# Patient Record
Sex: Female | Born: 1985 | Race: White | Hispanic: No | Marital: Married | Smoking: Former smoker
Health system: Southern US, Community
[De-identification: ages and names within clinical notes are randomized; demographics above are authoritative.]

## PROBLEM LIST (undated history)

## (undated) DIAGNOSIS — M419 Scoliosis, unspecified: Secondary | ICD-10-CM

## (undated) DIAGNOSIS — K219 Gastro-esophageal reflux disease without esophagitis: Secondary | ICD-10-CM

## (undated) DIAGNOSIS — J40 Bronchitis, not specified as acute or chronic: Secondary | ICD-10-CM

## (undated) DIAGNOSIS — F32A Depression, unspecified: Secondary | ICD-10-CM

## (undated) DIAGNOSIS — K2970 Gastritis, unspecified, without bleeding: Secondary | ICD-10-CM

## (undated) DIAGNOSIS — F419 Anxiety disorder, unspecified: Secondary | ICD-10-CM

## (undated) HISTORY — PX: UPPER GI ENDOSCOPY: SHX6162

---

## 2018-10-05 ENCOUNTER — Emergency Department (HOSPITAL_COMMUNITY)
Admission: EM | Admit: 2018-10-05 | Discharge: 2018-10-05 | Disposition: A | Discharge status: Home / Self Care | Payer: Self-pay | Attending: Emergency Medicine | Admitting: Emergency Medicine

## 2018-10-05 ENCOUNTER — Other Ambulatory Visit: Payer: Self-pay

## 2018-10-05 ENCOUNTER — Encounter (HOSPITAL_COMMUNITY): Payer: Self-pay

## 2018-10-05 ENCOUNTER — Emergency Department (HOSPITAL_COMMUNITY): Payer: Self-pay

## 2018-10-05 DIAGNOSIS — Z87891 Personal history of nicotine dependence: Secondary | ICD-10-CM | POA: Insufficient documentation

## 2018-10-05 DIAGNOSIS — J209 Acute bronchitis, unspecified: Secondary | ICD-10-CM | POA: Insufficient documentation

## 2018-10-05 DIAGNOSIS — J40 Bronchitis, not specified as acute or chronic: Secondary | ICD-10-CM

## 2018-10-05 DIAGNOSIS — J069 Acute upper respiratory infection, unspecified: Secondary | ICD-10-CM | POA: Insufficient documentation

## 2018-10-05 HISTORY — DX: Gastritis, unspecified, without bleeding: K29.70

## 2018-10-05 HISTORY — DX: Bronchitis, not specified as acute or chronic: J40

## 2018-10-05 MED ORDER — PREDNISONE 20 MG PO TABS
40.0000 mg | ORAL_TABLET | Freq: Once | ORAL | Status: AC
  Administered 2018-10-05: 40 mg via ORAL
  Filled 2018-10-05: qty 2

## 2018-10-05 MED ORDER — PREDNISONE 20 MG PO TABS: 40.0000 mg | ORAL_TABLET | Freq: Every day | ORAL | 0 refills | Status: DC

## 2018-10-05 MED ORDER — PSEUDOEPHEDRINE HCL 60 MG PO TABS
60.0000 mg | ORAL_TABLET | Freq: Once | ORAL | Status: AC
  Administered 2018-10-05: 60 mg via ORAL
  Filled 2018-10-05: qty 1

## 2018-10-05 MED ORDER — PSEUDOEPHEDRINE HCL ER 120 MG PO TB12: 120.0000 mg | ORAL_TABLET | Freq: Two times a day (BID) | ORAL | 1 refills | Status: DC

## 2018-10-05 NOTE — ED Provider Notes (Signed)
[facility] EMERGENCY DEPARTMENT Provider Note   CSN: [id number] Arrival date & time: 10/05/18  1844     History   Chief Complaint Chief Complaint  Patient presents with  . Cough    congestion, body aches    HPI Lindsay Mccoy is a 32 y.o. female.  Patient is a 32 year old female who presents to the emergency department with a complaint of cough and congestion and body aches.  The patient states she has been sick off and on for nearly a month.  She has been having problems with chest and nasal congestion.  She has had sensations of feeling short of breath at times.  She has had problems with body aches.  There is been no temperature elevation it has been measured.  There is been no excessive vomiting or diarrhea reported.  No blood in the phlegm.  Patient states that she has had both greenish and brownish looking phlegm at times.  Patient has not been out of the country recently.  She is a former smoker, and has been treated for bronchitis in the past.  The history is provided by the patient.    Past Medical History:  Diagnosis Date  . Bronchitis   . Gastritis     There are no active problems to display for this patient.   Past Surgical History:  Procedure Laterality Date  . UPPER GI ENDOSCOPY       OB History   None      Home Medications    Prior to Admission medications   Not on File    Family History No family history on file.  Social History Social History   Tobacco Use  . Smoking status: Former Smoker    Packs/day: 0.50    Years: 20.00    Pack years: 10.00    Types: Cigarettes    Last attempt to quit: 10/06/2015    Years since quitting: 3.0  . Smokeless tobacco: Former Engineer, waterUser  Substance Use Topics  . Alcohol use: Yes    Comment: occ  . Drug use: Never     Allergies   Patient has no known allergies.   Review of Systems Review of Systems  Constitutional: Negative for activity change and fever.       All ROS Neg except as noted in HPI    HENT: Positive for congestion and postnasal drip. Negative for nosebleeds.   Eyes: Negative for photophobia and discharge.  Respiratory: Positive for cough and shortness of breath. Negative for wheezing.   Cardiovascular: Negative for chest pain and palpitations.  Gastrointestinal: Negative for abdominal pain and blood in stool.  Genitourinary: Negative for dysuria, frequency and hematuria.  Musculoskeletal: Positive for myalgias. Negative for arthralgias, back pain and neck pain.  Skin: Negative.   Neurological: Negative for dizziness, seizures and speech difficulty.  Psychiatric/Behavioral: Negative for confusion and hallucinations.     Physical Exam Updated Vital Signs BP 117/64 (BP Location: Right Arm)   Pulse 79   Temp 98.7 F (37.1 C) (Oral)   Resp 17   Ht 5\' 5"  (1.651 m)   Wt 54.4 kg   LMP 09/21/2018 (Approximate)   SpO2 100%   BMI 19.97 kg/m   Physical Exam  Constitutional: She is oriented to person, place, and time. She appears well-developed and well-nourished.  Non-toxic appearance.  HENT:  Head: Normocephalic.  Right Ear: Tympanic membrane and external ear normal.  Left Ear: Tympanic membrane and external ear normal.  Nasal congestion present.  Eyes: Pupils are  equal, round, and reactive to light. EOM and lids are normal.  Neck: Normal range of motion. Neck supple. Carotid bruit is not present.  Cardiovascular: Normal rate, regular rhythm, normal heart sounds, intact distal pulses and normal pulses.  Pulmonary/Chest: No respiratory distress. She has rhonchi.  Abdominal: Soft. Bowel sounds are normal. There is no tenderness. There is no guarding.  Musculoskeletal: Normal range of motion.  Lymphadenopathy:       Head (right side): No submandibular adenopathy present.       Head (left side): No submandibular adenopathy present.    She has no cervical adenopathy.  Neurological: She is alert and oriented to person, place, and time. She has normal strength. No  cranial nerve deficit or sensory deficit. Coordination normal.  Skin: Skin is warm and dry. No rash noted.  Psychiatric: She has a normal mood and affect. Her speech is normal.  Nursing note and vitals reviewed.    ED Treatments / Results  Labs (all labs ordered are listed, but only abnormal results are displayed) Labs Reviewed - No data to display  EKG None  Radiology Dg Chest 2 View  Result Date: 10/05/2018 CLINICAL DATA:  Cough EXAM: CHEST - 2 VIEW COMPARISON:  None. FINDINGS: The heart size and mediastinal contours are within normal limits. Both lungs are clear. Severe dextroscoliosis of the thoracic spine. IMPRESSION: No active cardiopulmonary disease. Electronically Signed   By: Elige Ko   On: 10/05/2018 20:01    Procedures Procedures (including critical care time)  Medications Ordered in ED Medications - No data to display   Initial Impression / Assessment and Plan / ED Course  I have reviewed the triage vital signs and the nursing notes.  Pertinent labs & imaging results that were available during my care of the patient were reviewed by me and considered in my medical decision making (see chart for details).       Final Clinical Impressions(s) / ED Diagnoses MDM  Vital signs are within normal limits.  Pulse oximetry is 100% on room air.  Within normal limits by my interpretation.  Patient has been ill for nearly a month off and on.  Most recently she is been having increasing congestion, body aches, and occasionally a sensation of shortness of breath.  Patient has a history of being a former smoker.  Patient speaks in complete sentences without problem.  Patient is ambulatory without problem.  Chest x-ray shows some possible early bronchitic changes, but no consolidation or other emergent conditions.  Chest x-ray reviewed by me.  I discussed the findings of the examination as well as the findings of the x-ray with the patient in terms of which she  understands.  Prescription for prednisone 40 mg daily and Sudafed every 12 hours given to the patient.  Of asked her to wash hands frequently and for also provided for her mask.  Questions were answered.  Feel that it is safe for the patient to be discharged home.   Final diagnoses:  Bronchitis  Upper respiratory tract infection, unspecified type    ED Discharge Orders         Ordered    predniSONE (DELTASONE) 20 MG tablet  Daily     10/05/18 2020    pseudoephedrine (SUDAFED 12 HOUR) 120 MG 12 hr tablet  2 times daily     10/05/18 2020           Ivery Quale, PA-C 10/05/18 2028    Benjiman Core, MD 10/05/18 [phone]

## 2018-10-05 NOTE — ED Triage Notes (Signed)
Pt reports cough, congestion, body aches on and off for about a month. Pt does not have fever. Pt reports productive cough with greenish color.

## 2018-10-05 NOTE — Discharge Instructions (Signed)
Your vital signs are within normal limits.  Your oxygen level is 100% on room air.  Your chest x-ray suggest possible bronchitis changes, but no pneumonia or other acute problems.  Please increase fluids.  Please wash hands frequently.  Use Sudafed 2 times daily or every 12 hours.  Use prednisone daily with a meal.  Use Tylenol every 4 hours, or ibuprofen every 6 hours for fever, chills, or body aches.  Please use your mask until symptoms have resolved.

## 2019-01-22 ENCOUNTER — Emergency Department (HOSPITAL_COMMUNITY): Payer: Self-pay

## 2019-01-22 ENCOUNTER — Encounter (HOSPITAL_COMMUNITY): Payer: Self-pay

## 2019-01-22 ENCOUNTER — Emergency Department (HOSPITAL_COMMUNITY)
Admission: EM | Admit: 2019-01-22 | Discharge: 2019-01-22 | Disposition: A | Discharge status: Home / Self Care | Payer: Self-pay | Attending: Emergency Medicine | Admitting: Emergency Medicine

## 2019-01-22 ENCOUNTER — Other Ambulatory Visit: Payer: Self-pay

## 2019-01-22 DIAGNOSIS — Z87891 Personal history of nicotine dependence: Secondary | ICD-10-CM | POA: Insufficient documentation

## 2019-01-22 DIAGNOSIS — Z79899 Other long term (current) drug therapy: Secondary | ICD-10-CM | POA: Insufficient documentation

## 2019-01-22 DIAGNOSIS — J111 Influenza due to unidentified influenza virus with other respiratory manifestations: Secondary | ICD-10-CM | POA: Insufficient documentation

## 2019-01-22 DIAGNOSIS — R69 Illness, unspecified: Secondary | ICD-10-CM

## 2019-01-22 HISTORY — DX: Scoliosis, unspecified: M41.9

## 2019-01-22 MED ORDER — OSELTAMIVIR PHOSPHATE 75 MG PO CAPS: 75.0000 mg | ORAL_CAPSULE | Freq: Two times a day (BID) | ORAL | 0 refills | Status: AC

## 2019-01-22 NOTE — ED Provider Notes (Signed)
[facility] EMERGENCY DEPARTMENT Provider Note   CSN: [id number] Arrival date & time: 01/22/19  1423    History   Chief Complaint Chief Complaint  Patient presents with  . Generalized Body Aches    HPI Lindsay Mccoy is a 33 y.o. female.     The history is provided by the patient. No language interpreter was used.  Cough  Cough characteristics:  Productive Sputum characteristics:  Nondescript Severity:  Moderate Onset quality:  Gradual Duration:  2 days Timing:  Constant Progression:  Worsening Chronicity:  New Smoker: yes   Relieved by:  Nothing Worsened by:  Nothing Ineffective treatments:  None tried Associated symptoms: fever and shortness of breath   Pt reports she has been exposed to a Cabin crew with influenza A  Past Medical History:  Diagnosis Date  . Bronchitis   . Gastritis   . Scoliosis     There are no active problems to display for this patient.   Past Surgical History:  Procedure Laterality Date  . UPPER GI ENDOSCOPY       OB History   No obstetric history on file.      Home Medications    Prior to Admission medications   Medication Sig Start Date End Date Taking? Authorizing Provider  predniSONE (DELTASONE) 20 MG tablet Take 2 tablets (40 mg total) by mouth daily. 10/05/18   Ivery Quale, PA-C  pseudoephedrine (SUDAFED 12 HOUR) 120 MG 12 hr tablet Take 1 tablet (120 mg total) by mouth 2 (two) times daily. 10/05/18   Ivery Quale, PA-C    Family History No family history on file.  Social History Social History   Tobacco Use  . Smoking status: Former Smoker    Packs/day: 0.50    Years: 20.00    Pack years: 10.00    Types: Cigarettes    Last attempt to quit: 10/06/2015    Years since quitting: 3.2  . Smokeless tobacco: Former Engineer, water Use Topics  . Alcohol use: Not Currently    Comment: occ  . Drug use: Never     Allergies   Patient has no known allergies.   Review of Systems Review of Systems    Constitutional: Positive for fever.  Respiratory: Positive for cough and shortness of breath.   All other systems reviewed and are negative.    Physical Exam Updated Vital Signs BP 112/69 (BP Location: Right Arm)   Pulse 84   Temp 98.2 F (36.8 C) (Oral)   Resp 20   Ht 5\' 5"  (1.651 m)   Wt 56.2 kg   LMP 01/07/2019   SpO2 98%   BMI 20.63 kg/m   Physical Exam Vitals signs and nursing note reviewed.  Constitutional:      Appearance: She is well-developed.  HENT:     Head: Normocephalic.     Right Ear: Tympanic membrane normal.     Left Ear: Tympanic membrane normal.     Nose: Nose normal.     Mouth/Throat:     Mouth: Mucous membranes are moist.  Eyes:     Pupils: Pupils are equal, round, and reactive to light.  Neck:     Musculoskeletal: Normal range of motion and neck supple.  Cardiovascular:     Rate and Rhythm: Normal rate.     Pulses: Normal pulses.  Pulmonary:     Effort: Pulmonary effort is normal.  Abdominal:     General: Abdomen is flat. There is no distension.  Musculoskeletal: Normal range of  motion.  Skin:    General: Skin is warm.  Neurological:     Mental Status: She is alert and oriented to person, place, and time.      ED Treatments / Results  Labs (all labs ordered are listed, but only abnormal results are displayed) Labs Reviewed - No data to display  EKG None  Radiology No results found.  Procedures Procedures (including critical care time)  Medications Ordered in ED Medications - No data to display   Initial Impression / Assessment and Plan / ED Course  I have reviewed the triage vital signs and the nursing notes.  Pertinent labs & imaging results that were available during my care of the patient were reviewed by me and considered in my medical decision making (see chart for details).        Chest xray  No pneumonia   Final Clinical Impressions(s) / ED Diagnoses   Final diagnoses:  Influenza-like illness    ED  Discharge Orders         Ordered    oseltamivir (TAMIFLU) 75 MG capsule  2 times daily     01/22/19 1555        An After Visit Summary was printed and given to the patient.    Osie Cheeks 01/22/19 1558    Benjiman Core, MD 01/23/19 [phone]

## 2019-01-22 NOTE — Discharge Instructions (Signed)
Return if any problems.

## 2019-01-22 NOTE — ED Triage Notes (Signed)
Pt reports general aches and pains. Coughing and nausea. Reports feeling hot and cold. Since Sunday.Pt reports she has been around [employer] co workers

## 2019-02-20 ENCOUNTER — Other Ambulatory Visit: Payer: Self-pay

## 2019-02-20 ENCOUNTER — Emergency Department (HOSPITAL_COMMUNITY)
Admission: EM | Admit: 2019-02-20 | Discharge: 2019-02-20 | Disposition: A | Discharge status: Home / Self Care | Payer: Self-pay | Attending: Emergency Medicine | Admitting: Emergency Medicine

## 2019-02-20 DIAGNOSIS — Z87891 Personal history of nicotine dependence: Secondary | ICD-10-CM | POA: Insufficient documentation

## 2019-02-20 DIAGNOSIS — R197 Diarrhea, unspecified: Secondary | ICD-10-CM | POA: Insufficient documentation

## 2019-02-20 DIAGNOSIS — R112 Nausea with vomiting, unspecified: Secondary | ICD-10-CM | POA: Insufficient documentation

## 2019-02-20 DIAGNOSIS — Z79899 Other long term (current) drug therapy: Secondary | ICD-10-CM | POA: Insufficient documentation

## 2019-02-20 DIAGNOSIS — R1110 Vomiting, unspecified: Secondary | ICD-10-CM

## 2019-02-20 LAB — COMPREHENSIVE METABOLIC PANEL
ALT: 12 U/L (ref 0–44)
AST: 15 U/L (ref 15–41)
Albumin: 3.7 g/dL (ref 3.5–5.0)
Alkaline Phosphatase: 45 U/L (ref 38–126)
Anion gap: 7 (ref 5–15)
BUN: 19 mg/dL (ref 6–20)
CO2: 24 mmol/L (ref 22–32)
Calcium: 8.5 mg/dL — ABNORMAL LOW (ref 8.9–10.3)
Chloride: 106 mmol/L (ref 98–111)
Creatinine, Ser: 0.66 mg/dL (ref 0.44–1.00)
GFR calc Af Amer: >60 mL/min (ref >60)
GFR calc non Af Amer: >60 mL/min (ref >60)
Glucose, Bld: 92 mg/dL (ref 70–99)
Potassium: 3.6 mmol/L (ref 3.5–5.1)
Sodium: 137 mmol/L (ref 135–145)
Total Bilirubin: 0.3 mg/dL (ref 0.3–1.2)
Total Protein: 6.8 g/dL (ref 6.5–8.1)

## 2019-02-20 LAB — URINALYSIS, ROUTINE W REFLEX MICROSCOPIC
Bacteria, UA: NONE SEEN
Bilirubin Urine: NEGATIVE
Glucose, UA: NEGATIVE mg/dL
Ketones, ur: NEGATIVE mg/dL
Nitrite: NEGATIVE
Protein, ur: NEGATIVE mg/dL
RBC / HPF: >50 RBC/hpf — ABNORMAL HIGH (ref 0–5)
Specific Gravity, Urine: 1.008 (ref 1.005–1.030)
pH: 5 (ref 5.0–8.0)

## 2019-02-20 LAB — CBC WITH DIFFERENTIAL/PLATELET
Abs Immature Granulocytes: 0.03 10*3/uL (ref 0.00–0.07)
Basophils Absolute: 0.1 10*3/uL (ref 0.0–0.1)
Basophils Relative: 1 %
Eosinophils Absolute: 0.3 10*3/uL (ref 0.0–0.5)
Eosinophils Relative: 3 %
HCT: 37.4 % (ref 36.0–46.0)
Hemoglobin: 12.4 g/dL (ref 12.0–15.0)
Immature Granulocytes: 0 %
Lymphocytes Relative: 35 %
Lymphs Abs: 3.1 10*3/uL (ref 0.7–4.0)
MCH: 31.4 pg (ref 26.0–34.0)
MCHC: 33.2 g/dL (ref 30.0–36.0)
MCV: 94.7 fL (ref 80.0–100.0)
Monocytes Absolute: 0.7 10*3/uL (ref 0.1–1.0)
Monocytes Relative: 8 %
Neutro Abs: 4.6 10*3/uL (ref 1.7–7.7)
Neutrophils Relative %: 53 %
Platelets: 218 10*3/uL (ref 150–400)
RBC: 3.95 MIL/uL (ref 3.87–5.11)
RDW: 12.4 % (ref 11.5–15.5)
WBC: 8.7 10*3/uL (ref 4.0–10.5)
nRBC: 0 % (ref 0.0–0.2)

## 2019-02-20 LAB — PREGNANCY, URINE: Preg Test, Ur: NEGATIVE

## 2019-02-20 LAB — LIPASE, BLOOD: Lipase: 32 U/L (ref 11–51)

## 2019-02-20 MED ORDER — DICYCLOMINE HCL 10 MG/ML IM SOLN
20.0000 mg | Freq: Once | INTRAMUSCULAR | Status: AC
  Administered 2019-02-20: 03:00:00 20 mg via INTRAMUSCULAR
  Filled 2019-02-20: qty 2

## 2019-02-20 MED ORDER — ONDANSETRON HCL 4 MG/2ML IJ SOLN
4.0000 mg | Freq: Once | INTRAMUSCULAR | Status: AC
  Administered 2019-02-20: 03:00:00 4 mg via INTRAVENOUS
  Filled 2019-02-20: qty 2

## 2019-02-20 MED ORDER — SODIUM CHLORIDE 0.9 % IV BOLUS
1000.0000 mL | Freq: Once | INTRAVENOUS | Status: AC
  Administered 2019-02-20: 1000 mL via INTRAVENOUS

## 2019-02-20 MED ORDER — ONDANSETRON 4 MG PO TBDP: 4.0000 mg | ORAL_TABLET | Freq: Three times a day (TID) | ORAL | 0 refills | Status: DC | PRN

## 2019-02-20 NOTE — Discharge Instructions (Addendum)
As we discussed we suspect your vomiting and diarrhea is due to a virus.  Appendicitis was considered but thought to be less likely at this time.  You declined CT scan today.  You should follow-up with your doctor.  Return to the ED with worsening vomiting, fever, right-sided lower abdominal pain or any other concerns.

## 2019-02-20 NOTE — ED Provider Notes (Signed)
[facility] EMERGENCY DEPARTMENT Provider Note   CSN: [id number] Arrival date & time: 02/20/19  0156    History   Chief Complaint Chief Complaint  Patient presents with  . Emesis    HPI Lindsay Mccoy is a 33 y.o. female.     Patient with history of "gastritis" not on medications presenting with nausea, vomiting, diarrhea, chills and abdominal cramping that acutely onset about 2 hours ago.  States her temperature has been up and down but no documented fever.  Her temperature before coming in was 99.5.  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  She felt well prior to going into work but did have some crampy lower abdominal pain which she attributes to being on her period.  Suddenly while at work she developed nausea and had 2 episodes of emesis and 2 episodes of nonbloody diarrhea.  Has worsening lower abdominal cramping since.  No fever.  No pain with urination or blood in the urine.  No vaginal bleeding or discharge.  Denies any travel or sick contacts.  Denies any previous abdominal surgeries.  Contrary to triage note she denies cough, runny nose, sore throat and other URI symptoms.  The history is provided by the patient.  Emesis  Associated symptoms: abdominal pain, chills and diarrhea   Associated symptoms: no arthralgias, no cough, no fever and no myalgias     Past Medical History:  Diagnosis Date  . Bronchitis   . Gastritis   . Scoliosis     There are no active problems to display for this patient.   Past Surgical History:  Procedure Laterality Date  . UPPER GI ENDOSCOPY       OB History   No obstetric history on file.      Home Medications    Prior to Admission medications   Medication Sig Start Date End Date Taking? Authorizing Provider  predniSONE (DELTASONE) 20 MG tablet Take 2 tablets (40 mg total) by mouth daily. 10/05/18   Ivery Quale, PA-C  pseudoephedrine (SUDAFED 12 HOUR) 120 MG 12 hr tablet Take 1 tablet (120 mg total) by mouth 2  (two) times daily. 10/05/18   Ivery Quale, PA-C    Family History No family history on file.  Social History Social History   Tobacco Use  . Smoking status: Former Smoker    Packs/day: 0.50    Years: 20.00    Pack years: 10.00    Types: Cigarettes    Last attempt to quit: 10/06/2015    Years since quitting: 3.3  . Smokeless tobacco: Former Engineer, water Use Topics  . Alcohol use: Not Currently    Comment: occ  . Drug use: Never     Allergies   Patient has no known allergies.   Review of Systems Review of Systems  Constitutional: Positive for appetite change and chills. Negative for fever.  HENT: Negative for congestion and rhinorrhea.   Eyes: Negative for visual disturbance.  Respiratory: Negative for cough, chest tightness and shortness of breath.   Cardiovascular: Negative for chest pain.  Gastrointestinal: Positive for abdominal pain, diarrhea, nausea and vomiting. Negative for blood in stool.  Genitourinary: Negative for dysuria, hematuria, vaginal bleeding and vaginal discharge.  Musculoskeletal: Negative for arthralgias and myalgias.  Skin: Negative for rash.  Neurological: Negative for dizziness and weakness.    all other systems are negative except as noted in the HPI and PMH.    Physical Exam Updated Vital Signs BP 108/64 (BP Location:  Right Arm)   Pulse 62   Temp 97.6 F (36.4 C) (Oral)   Resp 18   Ht 5\' 6"  (1.676 m)   Wt 54.4 kg   LMP 01/19/2019 (Exact Date)   SpO2 100%   BMI 19.37 kg/m   Physical Exam Vitals signs and nursing note reviewed.  Constitutional:      General: She is not in acute distress.    Appearance: Normal appearance. She is well-developed and normal weight. She is not ill-appearing.  HENT:     Head: Normocephalic and atraumatic.     Mouth/Throat:     Mouth: Mucous membranes are moist.     Pharynx: No oropharyngeal exudate.  Eyes:     Conjunctiva/sclera: Conjunctivae normal.     Pupils: Pupils are equal, round,  and reactive to light.  Neck:     Musculoskeletal: Normal range of motion and neck supple.     Comments: No meningismus. Cardiovascular:     Rate and Rhythm: Normal rate and regular rhythm.     Heart sounds: Normal heart sounds. No murmur.  Pulmonary:     Effort: Pulmonary effort is normal. No respiratory distress.     Breath sounds: Normal breath sounds.  Abdominal:     Palpations: Abdomen is soft.     Tenderness: There is abdominal tenderness. There is no guarding or rebound.     Comments: Mild diffuse lower abdominal tenderness worse on the right side, no guarding or rebound  Musculoskeletal: Normal range of motion.        General: No tenderness.     Comments: No CVA tenderness  Skin:    General: Skin is warm.     Capillary Refill: Capillary refill takes less than 2 seconds.  Neurological:     General: No focal deficit present.     Mental Status: She is alert and oriented to person, place, and time. Mental status is at baseline.     Cranial Nerves: No cranial nerve deficit.     Motor: No abnormal muscle tone.     Coordination: Coordination normal.     Comments: No ataxia on finger to nose bilaterally. No pronator drift. 5/5 strength throughout. CN 2-12 intact.Equal grip strength. Sensation intact.   Psychiatric:        Behavior: Behavior normal.      ED Treatments / Results  Labs (all labs ordered are listed, but only abnormal results are displayed) Labs Reviewed  URINALYSIS, ROUTINE W REFLEX MICROSCOPIC - Abnormal; Notable for the following components:      Result Value   Hgb urine dipstick LARGE (*)    Leukocytes,Ua TRACE (*)    RBC / HPF >50 (*)    All other components within normal limits  COMPREHENSIVE METABOLIC PANEL - Abnormal; Notable for the following components:   Calcium 8.5 (*)    All other components within normal limits  PREGNANCY, URINE  CBC WITH DIFFERENTIAL/PLATELET  LIPASE, BLOOD    EKG None  Radiology No results found.  Procedures  Procedures (including critical care time)  Medications Ordered in ED Medications  ondansetron (ZOFRAN) injection 4 mg (has no administration in time range)  sodium chloride 0.9 % bolus 1,000 mL (has no administration in time range)  dicyclomine (BENTYL) injection 20 mg (has no administration in time range)     Initial Impression / Assessment and Plan / ED Course  I have reviewed the triage vital signs and the nursing notes.  Pertinent labs & imaging results that were available during  my care of the patient were reviewed by me and considered in my medical decision making (see chart for details).       Acute onset of nausea, vomiting, diarrhea, abdominal cramping with chills.  No documented fever. Abdomen soft without peritoneal signs.  We will give antiemetics, antispasmodics and fluids.  Labs reassuring. HCG negative. Blood in UA consistent with patient's menses.  No vomiting or diarrhea throughout ED stay.  Patient's pain and nausea improved with symptomatic control.  Her abdomen is soft on reevaluation with no guarding or rebound.  Suspect viral syndrome causing her nausea and vomiting.  Discussed possibility early appendicitis which is thought to be unlikely at this time.  Patient has no guarding or rebound in her right lower quadrant and her abdomen is soft.  Patient will be treated symptomatically and followup with her PCP.  She will return to the ED with worsening pain especially in her right lower quadrant, fever, vomiting or other concerns.  Final Clinical Impressions(s) / ED Diagnoses   Final diagnoses:  Vomiting and diarrhea    ED Discharge Orders    None       Cheney Ewart, Jeannett SeniorStephen, MD 02/20/19 [phone]

## 2019-02-20 NOTE — ED Notes (Signed)
Pt ambulated from waiting area to room with steady gait while looking at phone.

## 2019-02-20 NOTE — ED Notes (Signed)
Patient called for warm blanket. Reminded patient that urine specimen is still needed. Patient states that she is on her period. Checked with EDP who states that it would be an acceptable specimen.

## 2019-02-20 NOTE — ED Triage Notes (Signed)
Patient states that she got sick and vomited twice. Patient states runny stool, stomach pain, with chills and a mild cough. Patient states this all started tonight.

## 2019-08-21 ENCOUNTER — Other Ambulatory Visit: Payer: Self-pay

## 2019-08-21 ENCOUNTER — Encounter (HOSPITAL_COMMUNITY): Payer: Self-pay | Admitting: Emergency Medicine

## 2019-08-21 ENCOUNTER — Emergency Department (HOSPITAL_COMMUNITY)
Admission: EM | Admit: 2019-08-21 | Discharge: 2019-08-21 | Disposition: A | Discharge status: Home / Self Care | Payer: Self-pay | Attending: Emergency Medicine | Admitting: Emergency Medicine

## 2019-08-21 ENCOUNTER — Emergency Department (HOSPITAL_COMMUNITY): Payer: Self-pay

## 2019-08-21 DIAGNOSIS — R935 Abnormal findings on diagnostic imaging of other abdominal regions, including retroperitoneum: Secondary | ICD-10-CM | POA: Insufficient documentation

## 2019-08-21 DIAGNOSIS — N730 Acute parametritis and pelvic cellulitis: Secondary | ICD-10-CM

## 2019-08-21 DIAGNOSIS — Z87891 Personal history of nicotine dependence: Secondary | ICD-10-CM | POA: Insufficient documentation

## 2019-08-21 DIAGNOSIS — R932 Abnormal findings on diagnostic imaging of liver and biliary tract: Secondary | ICD-10-CM

## 2019-08-21 DIAGNOSIS — K769 Liver disease, unspecified: Secondary | ICD-10-CM | POA: Insufficient documentation

## 2019-08-21 DIAGNOSIS — E278 Other specified disorders of adrenal gland: Secondary | ICD-10-CM

## 2019-08-21 DIAGNOSIS — N739 Female pelvic inflammatory disease, unspecified: Secondary | ICD-10-CM | POA: Insufficient documentation

## 2019-08-21 LAB — COMPREHENSIVE METABOLIC PANEL
ALT: 18 U/L (ref 0–44)
AST: 21 U/L (ref 15–41)
Albumin: 3.8 g/dL (ref 3.5–5.0)
Alkaline Phosphatase: 51 U/L (ref 38–126)
Anion gap: 10 (ref 5–15)
BUN: 13 mg/dL (ref 6–20)
CO2: 24 mmol/L (ref 22–32)
Calcium: 8.7 mg/dL — ABNORMAL LOW (ref 8.9–10.3)
Chloride: 104 mmol/L (ref 98–111)
Creatinine, Ser: 0.76 mg/dL (ref 0.44–1.00)
GFR calc Af Amer: >60 mL/min (ref >60)
GFR calc non Af Amer: >60 mL/min (ref >60)
Glucose, Bld: 84 mg/dL (ref 70–99)
Potassium: 3.4 mmol/L — ABNORMAL LOW (ref 3.5–5.1)
Sodium: 138 mmol/L (ref 135–145)
Total Bilirubin: 1 mg/dL (ref 0.3–1.2)
Total Protein: 7.1 g/dL (ref 6.5–8.1)

## 2019-08-21 LAB — WET PREP, GENITAL
Clue Cells Wet Prep HPF POC: NONE SEEN
Sperm: NONE SEEN
Trich, Wet Prep: NONE SEEN
Yeast Wet Prep HPF POC: NONE SEEN

## 2019-08-21 LAB — URINALYSIS, ROUTINE W REFLEX MICROSCOPIC
Bacteria, UA: NONE SEEN
Bilirubin Urine: NEGATIVE
Glucose, UA: NEGATIVE mg/dL
Ketones, ur: NEGATIVE mg/dL
Leukocytes,Ua: NEGATIVE
Nitrite: NEGATIVE
Protein, ur: 30 mg/dL — AB
RBC / HPF: >50 RBC/hpf — ABNORMAL HIGH (ref 0–5)
Specific Gravity, Urine: 1.019 (ref 1.005–1.030)
pH: 5 (ref 5.0–8.0)

## 2019-08-21 LAB — CBC WITH DIFFERENTIAL/PLATELET
Abs Immature Granulocytes: 0.01 10*3/uL (ref 0.00–0.07)
Basophils Absolute: 0.1 10*3/uL (ref 0.0–0.1)
Basophils Relative: 1 %
Eosinophils Absolute: 0.1 10*3/uL (ref 0.0–0.5)
Eosinophils Relative: 2 %
HCT: 38.8 % (ref 36.0–46.0)
Hemoglobin: 12.2 g/dL (ref 12.0–15.0)
Immature Granulocytes: 0 %
Lymphocytes Relative: 45 %
Lymphs Abs: 2.9 10*3/uL (ref 0.7–4.0)
MCH: 29.4 pg (ref 26.0–34.0)
MCHC: 31.4 g/dL (ref 30.0–36.0)
MCV: 93.5 fL (ref 80.0–100.0)
Monocytes Absolute: 0.5 10*3/uL (ref 0.1–1.0)
Monocytes Relative: 7 %
Neutro Abs: 2.9 10*3/uL (ref 1.7–7.7)
Neutrophils Relative %: 45 %
Platelets: 236 10*3/uL (ref 150–400)
RBC: 4.15 MIL/uL (ref 3.87–5.11)
RDW: 13.7 % (ref 11.5–15.5)
WBC: 6.5 10*3/uL (ref 4.0–10.5)
nRBC: 0 % (ref 0.0–0.2)

## 2019-08-21 LAB — PREGNANCY, URINE: Preg Test, Ur: NEGATIVE

## 2019-08-21 LAB — LIPASE, BLOOD: Lipase: 26 U/L (ref 11–51)

## 2019-08-21 MED ORDER — IOHEXOL 300 MG/ML  SOLN
80.0000 mL | Freq: Once | INTRAMUSCULAR | Status: AC | PRN
  Administered 2019-08-21: 80 mL via INTRAVENOUS

## 2019-08-21 MED ORDER — DOXYCYCLINE HYCLATE 100 MG PO CAPS: 100.0000 mg | ORAL_CAPSULE | Freq: Two times a day (BID) | ORAL | 0 refills | Status: AC

## 2019-08-21 MED ORDER — ONDANSETRON HCL 4 MG/2ML IJ SOLN
4.0000 mg | Freq: Once | INTRAMUSCULAR | Status: AC
  Administered 2019-08-21: 17:00:00 4 mg via INTRAVENOUS
  Filled 2019-08-21: qty 2

## 2019-08-21 MED ORDER — KETOROLAC TROMETHAMINE 30 MG/ML IJ SOLN
30.0000 mg | Freq: Once | INTRAMUSCULAR | Status: AC
  Administered 2019-08-21: 30 mg via INTRAVENOUS
  Filled 2019-08-21: qty 1

## 2019-08-21 MED ORDER — AZITHROMYCIN 250 MG PO TABS
1000.0000 mg | ORAL_TABLET | Freq: Once | ORAL | Status: AC
  Administered 2019-08-21: 1000 mg via ORAL
  Filled 2019-08-21: qty 4

## 2019-08-21 MED ORDER — SODIUM CHLORIDE 0.9 % IV BOLUS
1000.0000 mL | Freq: Once | INTRAVENOUS | Status: AC
  Administered 2019-08-21: 1000 mL via INTRAVENOUS

## 2019-08-21 MED ORDER — DICYCLOMINE HCL 10 MG PO CAPS
10.0000 mg | ORAL_CAPSULE | Freq: Once | ORAL | Status: AC
  Administered 2019-08-21: 10 mg via ORAL
  Filled 2019-08-21: qty 1

## 2019-08-21 MED ORDER — ONDANSETRON HCL 4 MG PO TABS: 4.0000 mg | ORAL_TABLET | Freq: Three times a day (TID) | ORAL | 0 refills | Status: DC | PRN

## 2019-08-21 MED ORDER — CEFTRIAXONE SODIUM 250 MG IJ SOLR
250.0000 mg | Freq: Once | INTRAMUSCULAR | Status: AC
  Administered 2019-08-21: 21:00:00 250 mg via INTRAMUSCULAR
  Filled 2019-08-21: qty 250

## 2019-08-21 NOTE — ED Provider Notes (Signed)
[facility] EMERGENCY DEPARTMENT Provider Note   CSN: [id number] Arrival date & time: 08/21/19  1433     History   Chief Complaint Chief Complaint  Patient presents with   Abdominal Pain    HPI Lindsay Mccoy is a 33 y.o. female.     HPI   Pt is a 33 y/o female with a h/o bronchitis, gastritis, scoliosis, who presents to the ED today c/o "extreme PMS symptoms" that she states started 3 days ago. She reports hot flashes, dizziness, nausea, diarrhea, She reports intermittent abd pain that is diffuse. The sxs usually occur at night and she describes the pain as cramping and/or sharp. Currently she states her pain is minimal.  She reports chest congestion, but denies a cough, sore throat, or nasal congestion/rhionrrhea She has some shortness of breath at night time. Denies fevers, dysuria, frequency, urgency, or hematuria. She denies any recent abnormal vaginal discharge. She is currently sexually active with one female partner and does not use protection. She denies concern for STD.   Denies any known COVID exposures.   She has been taking Midol without relief.  She started her menses 2 days ago.   Past Medical History:  Diagnosis Date   Bronchitis    Gastritis    Scoliosis     There are no active problems to display for this patient.   Past Surgical History:  Procedure Laterality Date   UPPER GI ENDOSCOPY       OB History   No obstetric history on file.      Home Medications    Prior to Admission medications   Medication Sig Start Date End Date Taking? Authorizing Provider  doxycycline (VIBRAMYCIN) 100 MG capsule Take 1 capsule (100 mg total) by mouth 2 (two) times daily for 14 days. 08/21/19 09/04/19  Aliyah Abeyta S, PA-C  ondansetron (ZOFRAN ODT) 4 MG disintegrating tablet Take 1 tablet (4 mg total) by mouth every 8 (eight) hours as needed for nausea or vomiting. Patient not taking: Reported on 08/21/2019 02/20/19   Glynn Octave, MD  ondansetron (ZOFRAN) 4  MG tablet Take 1 tablet (4 mg total) by mouth every 8 (eight) hours as needed for nausea or vomiting. 08/21/19   Lemoyne Nestor S, PA-C  predniSONE (DELTASONE) 20 MG tablet Take 2 tablets (40 mg total) by mouth daily. Patient not taking: Reported on 08/21/2019 10/05/18   Ivery Quale, PA-C  pseudoephedrine (SUDAFED 12 HOUR) 120 MG 12 hr tablet Take 1 tablet (120 mg total) by mouth 2 (two) times daily. Patient not taking: Reported on 08/21/2019 10/05/18   Ivery Quale, PA-C    Family History No family history on file.  Social History Social History   Tobacco Use   Smoking status: Former Smoker    Packs/day: 0.50    Years: 20.00    Pack years: 10.00    Types: Cigarettes    Quit date: 10/06/2015    Years since quitting: 3.8   Smokeless tobacco: Former Neurosurgeon  Substance Use Topics   Alcohol use: Not Currently    Comment: occ   Drug use: Never     Allergies   Patient has no known allergies.   Review of Systems Review of Systems  Constitutional: Negative for fever.  HENT: Negative for ear pain and sore throat.   Eyes: Negative for visual disturbance.  Respiratory: Negative for cough and shortness of breath.   Cardiovascular: Negative for chest pain.  Gastrointestinal: Positive for abdominal pain, diarrhea and nausea. Negative for blood  in stool, constipation and vomiting.  Genitourinary: Positive for vaginal bleeding (on menses). Negative for dysuria, frequency, hematuria, vaginal discharge and vaginal pain.  Musculoskeletal: Negative for back pain.  Skin: Negative for rash.  Neurological: Negative for headaches.  All other systems reviewed and are negative.    Physical Exam Updated Vital Signs BP 116/77    Pulse (!) 51    Temp 98.3 F (36.8 C) (Oral)    Resp 16    Ht  (1.651 m)    Wt 52.2 kg    SpO2 100%    BMI 19.14 kg/m   Physical Exam Vitals signs and nursing note reviewed.  Constitutional:      General: She is not in acute distress.    Appearance:  She is well-developed. She is not ill-appearing or toxic-appearing.  HENT:     Head: Normocephalic and atraumatic.  Eyes:     Conjunctiva/sclera: Conjunctivae normal.  Neck:     Musculoskeletal: Neck supple.  Cardiovascular:     Rate and Rhythm: Normal rate and regular rhythm.     Heart sounds: Normal heart sounds. No murmur.  Pulmonary:     Effort: Pulmonary effort is normal. No respiratory distress.     Breath sounds: Normal breath sounds. No wheezing, rhonchi or rales.  Abdominal:     General: Bowel sounds are normal.     Palpations: Abdomen is soft.     Tenderness: There is left CVA tenderness. There is no guarding or rebound.     Comments: Very mild epigastric, RLQ and LLQ abd TTP.   Genitourinary:    Comments: Exam performed by Karrie Meres,  exam chaperoned Date: 08/21/2019 Pelvic exam: normal external genitalia without evidence of trauma. VULVA: normal appearing vulva with no masses, tenderness or lesion. VAGINA: normal appearing vagina with normal color and discharge, no lesions. Moderate blood in the vaginal vault.  CERVIX: normal appearing cervix without lesions, cervical motion tenderness absent, cervical os closed with out purulent discharge; vaginal discharge absent Wet prep and DNA probe for chlamydia and GC obtained.  ADNEXA: normal adnexa in size, nontender and no masses UTERUS: uterus is normal size, shape, consistency and nontender.  Skin:    General: Skin is warm and dry.  Neurological:     Mental Status: She is alert.     ED Treatments / Results  Labs (all labs ordered are listed, but only abnormal results are displayed) Labs Reviewed  WET PREP, GENITAL - Abnormal; Notable for the following components:      Result Value   WBC, Wet Prep HPF POC FEW (*)    All other components within normal limits  COMPREHENSIVE METABOLIC PANEL - Abnormal; Notable for the following components:   Potassium 3.4 (*)    Calcium 8.7 (*)    All other components within  normal limits  URINALYSIS, ROUTINE W REFLEX MICROSCOPIC - Abnormal; Notable for the following components:   APPearance HAZY (*)    Hgb urine dipstick LARGE (*)    Protein, ur 30 (*)    RBC / HPF >50 (*)    All other components within normal limits  CBC WITH DIFFERENTIAL/PLATELET  LIPASE, BLOOD  PREGNANCY, URINE  GC/CHLAMYDIA PROBE AMP ([place]) NOT AT [facility]    EKG None  Radiology Ct Abdomen Pelvis W Contrast  Result Date: 08/21/2019 CLINICAL DATA:  Abdominal pain, hot flashes, nausea, diarrhea and cramping for 3 days EXAM: CT ABDOMEN AND PELVIS WITH CONTRAST TECHNIQUE: Multidetector CT imaging of the abdomen and  pelvis was performed using the standard protocol following bolus administration of intravenous contrast. CONTRAST:  33mL OMNIPAQUE IOHEXOL 300 MG/ML  SOLN COMPARISON:  None. FINDINGS: Lower chest: Lung bases are clear. Normal heart size. No pericardial effusion. Hepatobiliary: Focal fatty infiltration is present along the falciform ligament. There is a hypoattenuating, 5 x 5.6 cm lobular lesion in the left lobe liver with nodular discontinuous peripheral hyperenhancement. A second smaller lesion demonstrating similar characteristics is seen in close proximity measuring up to 1.1 cm in size. Gallbladder is largely decompressed. No visible gallstones, gallbladder wall thickening or pericholecystic inflammation. There is slight prominence of the intra and extrahepatic biliary tree but without frank dilatation of the common bile duct (measuring 6 mm in diameter). Pancreas: Unremarkable. No pancreatic ductal dilatation or surrounding inflammatory changes. Spleen: Normal in size without focal abnormality. Adrenals/Urinary Tract: There is a 13 mm, nodule in the right adrenal gland of intermediate attenuation (22 HU) which is incompletely characterized on this single-phase study. Kidneys enhance symmetrically. No concerning renal lesions, hydronephrosis or urolithiasis. There is circumferential  bladder wall thickening and perivesicular hazy stranding. Stomach/Bowel: Distal esophagus, stomach and duodenal sweep are unremarkable. No small bowel wall thickening or dilatation. No evidence of obstruction. A normal appendix is visualized. No colonic dilatation or wall thickening. Vascular/Lymphatic: The aorta is normal caliber. Reactive lower abdominal nodes are present. No pathologically enlarged nodes. Reproductive: The uterus is retroflexed. There is a questionably hyperemic appearance of the uterus greater than expected for normal contrast timing with indistinctness of the fat planes in the broad ligament and bilateral venous engorgement. No concerning adnexal lesions are seen. Bilateral Essure wires are noted. Other: Small volume of free fluid is seen in the pelvis measuring simple attenuation (2 HU). No free abdominopelvic air. No bowel containing hernias. Musculoskeletal: No acute osseous abnormality or suspicious osseous lesion. IMPRESSION: 1. Circumferential bladder wall thickening and perivesicular hazy stranding, suggestive of cystitis. Correlate with urinalysis. 2. There is a questionably hyperemic appearance of the uterus with indistinctness of the fat planes in the broad ligament and bilateral venous engorgement, which can be seen in the setting of pelvic inflammatory disease. Recommend further evaluation with pelvic exam findings and ultrasound as clinically indicated. 3. Indeterminate 13 mm right adrenal gland nodule, which is incompletely characterized on this single-phase study. Based on size, finding is likely benign. Could consider further evaluation with dedicated adrenal protocol CT or MRI in 12 months time. This recommendation follows ACR consensus guidelines: Management of Incidental Adrenal Masses: A White Paper of the ACR Incidental Findings Committee. J Am Coll Radiol 2017;14:1038-1044. 4. Slight prominence of the intra and extrahepatic biliary tree but without frank dilatation of the  common bile duct (measuring up to 6 mm in diameter). Correlate with LFTs. 5. 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 Could consider dedicated hepatic MRI if clinically warranted. Electronically Signed   By: Lovena Le M.D.   On: 08/21/2019 20:41    Procedures Procedures (including critical care time)  Medications Ordered in ED Medications  sodium chloride 0.9 % bolus 1,000 mL (0 mLs Intravenous Stopped 08/21/19 1805)  ketorolac (TORADOL) 30 MG/ML injection 30 mg (30 mg Intravenous Given 08/21/19 1708)  ondansetron (ZOFRAN) injection 4 mg (4 mg Intravenous Given 08/21/19 1707)  dicyclomine (BENTYL) capsule 10 mg (10 mg Oral Given 08/21/19 1701)  iohexol (OMNIPAQUE) 300 MG/ML solution 80  mL (80 mLs Intravenous Contrast Given 08/21/19 2021)  cefTRIAXone (ROCEPHIN) injection 250 mg (250 mg Intramuscular Given 08/21/19 2122)  azithromycin (ZITHROMAX) tablet 1,000 mg (1,000 mg Oral Given 08/21/19 2122)     Initial Impression / Assessment and Plan / ED Course  I have reviewed the triage vital signs and the nursing notes.  Pertinent labs & imaging results that were available during my care of the patient were reviewed by me and considered in my medical decision making (see chart for details).     Final Clinical Impressions(s) / ED Diagnoses   Final diagnoses:  PID (acute pelvic inflammatory disease)  Adrenal nodule (HCC)  Liver lesion  Abnormal contrast radiography of biliary tree   Pt is a 33 y/o female with a h/o bronchitis, gastritis, scoliosis, who presents to the ED today c/o "extreme PMS symptoms" that she states started 3 days ago. She reports hot flashes, dizziness, nausea, diarrhea, She reports intermittent abd pain that is diffuse. The sxs usually occur at night and she describes the pain as cramping and/or sharp.  Currently she states her pain is minimal.  abd with mild epigastric and bilat lower quadrant ttp. Pelvic with very mild right adnexal ttp. No CMT.   CBC wnl CMP wnl Lipase wnl UA with hematuria, >50 RBC, 21-50 WBCs, no bacteria, 6-10 squamous epithelial cells. Suspect contaminated specimen. Pt asymptomatic, doubt uti.  Wet prep w/o evidence for BV, trich, or yeast GC/chlamydia pending  Advanced imaging felt to be indicated in setting of persistent RLQ TTP despite meds.   CT abd/pelvis as follows: 1. Circumferential bladder wall thickening and perivesicular hazy stranding, suggestive of cystitis. Correlate with urinalysis.   - Doubt UTI 2. There is a questionably hyperemic appearance of the uterus with indistinctness of the fat planes in the broad ligament and bilateral venous engorgement, which can be seen in the setting of pelvic inflammatory disease. Recommend further evaluation with pelvic exam findings and ultrasound as clinically indicated.   - More likely related to PID and will tx for this. I doubt ovarian torsion or TOOA. 3. Indeterminate 13 mm right adrenal gland nodule, which is incompletely characterized on this single-phase study. Based on size, finding is likely benign.   - Pt made aware of need for f/u 4. Slight prominence of the intra and extrahepatic biliary tree but without frank dilatation of the common bile duct (measuring up to 6 mm in diameter). Correlate with LFTs.   - Pt with normal LFTs and no RUQ abd TTP. She was made aware of need for f/u.  5. 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 Could consider dedicated hepatic MRI if clinically warranted.  - Pt make aware of need for f/u.   As above, suspect patient symptoms may be related to PID.  Will treat for such.  Given dose of ceftriaxone/[place] in the ED  and also sent home with Rx for doxycycline.  She was made aware of all of the incidental findings on her CT scan and was given information to follow-up with PCP as well as GI.  Advised her to return to the ED for new or worsening symptoms.  She voiced understanding of plan and reasons to return.  All questions answered.  Patient stable for discharge.    ED Discharge Orders         Ordered  doxycycline (VIBRAMYCIN) 100 MG capsule  2 times daily     08/21/19 2109    ondansetron (ZOFRAN) 4 MG tablet  Every 8 hours PRN     08/21/19 2116           Karrie Meres, PA-C 08/21/19 2123    Sabas Sous, MD 08/24/19 1254

## 2019-08-21 NOTE — Discharge Instructions (Addendum)
You were given a prescription for antibiotics. Please take the antibiotic prescription fully.  ° °Please follow up with your primary doctor within the next 5-7 days.  If you do not have a primary care provider, information for a healthcare clinic has been provided for you to make arrangements for follow up care. Please return to the ER sooner if you have any new or worsening symptoms, or if you have any of the following symptoms: ° °Abdominal pain that does not go away.  °You have a fever.  °You keep throwing up (vomiting).  °The pain is felt only in portions of the abdomen. Pain in the right side could possibly be appendicitis. In an adult, pain in the left lower portion of the abdomen could be colitis or diverticulitis.  °You pass bloody or black tarry stools.  °There is bright red blood in the stool.  °The constipation stays for more than 4 days.  °There is belly (abdominal) or rectal pain.  °You do not seem to be getting better.  °You have any questions or concerns.  ° °

## 2019-08-21 NOTE — ED Triage Notes (Signed)
Pt states having " hot flashes", dizzy, n/d and cramping x 3 days.   Pt on her period but states " its worse this time"

## 2019-08-22 LAB — GC/CHLAMYDIA PROBE AMP (~~LOC~~) NOT AT ARMC
Chlamydia: NEGATIVE
Neisseria Gonorrhea: NEGATIVE

## 2019-09-10 ENCOUNTER — Other Ambulatory Visit: Payer: Self-pay

## 2019-09-10 ENCOUNTER — Encounter (HOSPITAL_COMMUNITY): Payer: Self-pay | Admitting: *Deleted

## 2019-09-10 ENCOUNTER — Emergency Department (HOSPITAL_COMMUNITY)
Admission: EM | Admit: 2019-09-10 | Discharge: 2019-09-10 | Disposition: A | Discharge status: Home / Self Care | Payer: Self-pay | Attending: Emergency Medicine | Admitting: Emergency Medicine

## 2019-09-10 DIAGNOSIS — R102 Pelvic and perineal pain: Secondary | ICD-10-CM | POA: Insufficient documentation

## 2019-09-10 DIAGNOSIS — Z79899 Other long term (current) drug therapy: Secondary | ICD-10-CM | POA: Insufficient documentation

## 2019-09-10 DIAGNOSIS — Z87891 Personal history of nicotine dependence: Secondary | ICD-10-CM | POA: Insufficient documentation

## 2019-09-10 LAB — CBC WITH DIFFERENTIAL/PLATELET
Abs Immature Granulocytes: 0.03 10*3/uL (ref 0.00–0.07)
Basophils Absolute: 0.1 10*3/uL (ref 0.0–0.1)
Basophils Relative: 1 %
Eosinophils Absolute: 0.1 10*3/uL (ref 0.0–0.5)
Eosinophils Relative: 2 %
HCT: 40 % (ref 36.0–46.0)
Hemoglobin: 12.8 g/dL (ref 12.0–15.0)
Immature Granulocytes: 0 %
Lymphocytes Relative: 38 %
Lymphs Abs: 2.6 10*3/uL (ref 0.7–4.0)
MCH: 30 pg (ref 26.0–34.0)
MCHC: 32 g/dL (ref 30.0–36.0)
MCV: 93.7 fL (ref 80.0–100.0)
Monocytes Absolute: 0.4 10*3/uL (ref 0.1–1.0)
Monocytes Relative: 5 %
Neutro Abs: 3.7 10*3/uL (ref 1.7–7.7)
Neutrophils Relative %: 54 %
Platelets: 270 10*3/uL (ref 150–400)
RBC: 4.27 MIL/uL (ref 3.87–5.11)
RDW: 13.8 % (ref 11.5–15.5)
WBC: 6.9 10*3/uL (ref 4.0–10.5)
nRBC: 0 % (ref 0.0–0.2)

## 2019-09-10 LAB — COMPREHENSIVE METABOLIC PANEL
ALT: 21 U/L (ref 0–44)
AST: 28 U/L (ref 15–41)
Albumin: 4.4 g/dL (ref 3.5–5.0)
Alkaline Phosphatase: 50 U/L (ref 38–126)
Anion gap: 9 (ref 5–15)
BUN: 12 mg/dL (ref 6–20)
CO2: 22 mmol/L (ref 22–32)
Calcium: 9.2 mg/dL (ref 8.9–10.3)
Chloride: 105 mmol/L (ref 98–111)
Creatinine, Ser: 0.65 mg/dL (ref 0.44–1.00)
GFR calc Af Amer: >60 mL/min (ref >60)
GFR calc non Af Amer: >60 mL/min (ref >60)
Glucose, Bld: 89 mg/dL (ref 70–99)
Potassium: 3.9 mmol/L (ref 3.5–5.1)
Sodium: 136 mmol/L (ref 135–145)
Total Bilirubin: 0.5 mg/dL (ref 0.3–1.2)
Total Protein: 8.1 g/dL (ref 6.5–8.1)

## 2019-09-10 LAB — WET PREP, GENITAL
Clue Cells Wet Prep HPF POC: NONE SEEN
Sperm: NONE SEEN
Trich, Wet Prep: NONE SEEN
Yeast Wet Prep HPF POC: NONE SEEN

## 2019-09-10 LAB — URINALYSIS, ROUTINE W REFLEX MICROSCOPIC
Bacteria, UA: NONE SEEN
Bilirubin Urine: NEGATIVE
Glucose, UA: NEGATIVE mg/dL
Ketones, ur: NEGATIVE mg/dL
Leukocytes,Ua: NEGATIVE
Nitrite: NEGATIVE
Protein, ur: NEGATIVE mg/dL
Specific Gravity, Urine: 1.003 — ABNORMAL LOW (ref 1.005–1.030)
pH: 5 (ref 5.0–8.0)

## 2019-09-10 LAB — LIPASE, BLOOD: Lipase: 36 U/L (ref 11–51)

## 2019-09-10 LAB — POC OCCULT BLOOD, ED: Fecal Occult Bld: NEGATIVE

## 2019-09-10 LAB — POC URINE PREG, ED: Preg Test, Ur: NEGATIVE

## 2019-09-10 MED ORDER — AZITHROMYCIN 250 MG PO TABS
500.0000 mg | ORAL_TABLET | Freq: Once | ORAL | Status: AC
  Administered 2019-09-10: 500 mg via ORAL
  Filled 2019-09-10: qty 2

## 2019-09-10 MED ORDER — SODIUM CHLORIDE 0.9 % IV SOLN
INTRAVENOUS | Status: DC
  Administered 2019-09-10: 10:00:00 via INTRAVENOUS

## 2019-09-10 MED ORDER — AZITHROMYCIN 250 MG PO TABS
500.0000 mg | ORAL_TABLET | Freq: Once | ORAL | Status: AC
  Administered 2019-09-10: 11:00:00 500 mg via ORAL
  Filled 2019-09-10: qty 2

## 2019-09-10 MED ORDER — PROCHLORPERAZINE EDISYLATE 10 MG/2ML IJ SOLN
5.0000 mg | Freq: Once | INTRAMUSCULAR | Status: AC
  Administered 2019-09-10: 10:00:00 5 mg via INTRAVENOUS
  Filled 2019-09-10: qty 2

## 2019-09-10 MED ORDER — MORPHINE SULFATE (PF) 2 MG/ML IV SOLN
2.0000 mg | Freq: Once | INTRAVENOUS | Status: AC
  Administered 2019-09-10: 11:00:00 2 mg via INTRAVENOUS
  Filled 2019-09-10: qty 1

## 2019-09-10 MED ORDER — DEXTROSE 5 % IV SOLN: 250.0000 mg | Freq: Once | INTRAVENOUS | Status: DC

## 2019-09-10 MED ORDER — SODIUM CHLORIDE 0.9 % IV BOLUS
1000.0000 mL | Freq: Once | INTRAVENOUS | Status: AC
  Administered 2019-09-10: 10:00:00 1000 mL via INTRAVENOUS

## 2019-09-10 MED ORDER — KETOROLAC TROMETHAMINE 15 MG/ML IJ SOLN
15.0000 mg | Freq: Once | INTRAMUSCULAR | Status: AC
  Administered 2019-09-10: 10:00:00 15 mg via INTRAVENOUS
  Filled 2019-09-10: qty 1

## 2019-09-10 MED ORDER — CEFTRIAXONE SODIUM 250 MG IJ SOLR
250.0000 mg | Freq: Once | INTRAMUSCULAR | Status: AC
  Administered 2019-09-10: 11:00:00 250 mg via INTRAMUSCULAR
  Filled 2019-09-10: qty 250

## 2019-09-10 NOTE — Discharge Instructions (Signed)
Your wet prep is negative for trichomonas, yeast, or bacterial vaginitis.  A gonorrhea chlamydia test has been sent to the lab.  Your urine test is negative for infection or kidney stone.  Your pregnancy test is negative.  Your complete blood count and your chemistries test are both nonacute.  Review of your pelvic and transvaginal ultrasound negative for acute mass, abscess or acute changes.  Your vital signs are within normal limits.  Your oxygen level is 100% on room air. Please see the GYN specialist for additional evaluation concerning your pain.  Return to the emergency department if there are changes in your condition, high fever, nausea/vomiting, vaginal bleeding, or worsening of symptoms.

## 2019-09-10 NOTE — ED Provider Notes (Signed)
[facility] Provider Note   CSN: [id number] Arrival date & time: 09/10/19  [id number]     History   Chief Complaint No chief complaint on file.   HPI Lindsay Mccoy is a 33 y.o. female.     Patient is a 33 year old female who presents to the emergency department with a complaint of abdominal pain.  The patient states this problem started a little more than 3 weeks ago with pelvic and abdominal pain.  She was seen in the emergency department and exam and CT findings were consistent with pelvic inflammatory disease.  The patient was treated with doxycycline.  The patient states she finished the doxycycline, but felt as though her symptoms were worse.  [facility], at which time she had an extensive work-up with ultrasound, and examination, and lab work.  She was discharged with a diagnosis of abdominal pain, and was set up for GYN evaluation. On October 16, the patient states that the pain got worse.  She had been getting some mild relief from Tylenol and ibuprofen, so she went back to taking the Tylenol and ibuprofen as suggested by the physicians at the [facility].  She was at work this morning and between the hours of 2:00, and 3:00AM the pain became almost unbearable.  She says it felt as though her pelvis was on fire.  She had several episodes of diarrhea over a 3-hour period.  The patient had nausea, but no actual vomiting.  She took some Tylenol, but this did not help at all.  The patient states that between October 16 and now she has not had fever.  She has had some nausea.  Is been no recent surgery.  No accidents or injury to the abdomen or pelvis area.  She has not had any changes in medication with the exception of the doxycycline that she was given.  She has not had any vaginal bleeding.  She has not been sexually active over the past 2 weeks.  The pain eases a little when she is lying down or sitting, but is  worse when she is standing, and sometimes worse without any changes whatsoever.  The history is provided by the patient.    Past Medical History:  Diagnosis Date  . Bronchitis   . Gastritis   . Scoliosis     There are no active problems to display for this patient.   Past Surgical History:  Procedure Laterality Date  . UPPER GI ENDOSCOPY       OB History   No obstetric history on file.      Home Medications    Prior to Admission medications   Medication Sig Start Date End Date Taking? Authorizing Provider  ondansetron (ZOFRAN ODT) 4 MG disintegrating tablet Take 1 tablet (4 mg total) by mouth every 8 (eight) hours as needed for nausea or vomiting. Patient not taking: Reported on 08/21/2019 02/20/19   Glynn Octaveancour, Stephen, MD  ondansetron (ZOFRAN) 4 MG tablet Take 1 tablet (4 mg total) by mouth every 8 (eight) hours as needed for nausea or vomiting. 08/21/19   Couture, Cortni S, PA-C  predniSONE (DELTASONE) 20 MG tablet Take 2 tablets (40 mg total) by mouth daily. Patient not taking: Reported on 08/21/2019 10/05/18   Ivery QualeBryant, Shakina Choy, PA-C  pseudoephedrine (SUDAFED 12 HOUR) 120 MG 12 hr tablet Take 1 tablet (120 mg total) by mouth 2 (two) times daily. Patient not taking: Reported on 08/21/2019 10/05/18  Ivery Quale, PA-C    Family History No family history on file.  Social History Social History   Tobacco Use  . Smoking status: Former Smoker    Packs/day: 0.50    Years: 20.00    Pack years: 10.00    Types: Cigarettes    Quit date: 10/06/2015    Years since quitting: 3.9  . Smokeless tobacco: Former Engineer, water Use Topics  . Alcohol use: Not Currently    Comment: occ  . Drug use: Never     Allergies   Patient has no known allergies.   Review of Systems Review of Systems  Constitutional: Negative for activity change and fever.       All ROS Neg except as noted in HPI  HENT: Negative.   Eyes: Negative for photophobia and discharge.  Respiratory: Negative  for cough, shortness of breath and wheezing.   Cardiovascular: Negative for chest pain and palpitations.  Gastrointestinal: Positive for abdominal pain. Negative for blood in stool.  Genitourinary: Positive for pelvic pain and vaginal pain. Negative for dysuria, frequency and hematuria.  Musculoskeletal: Negative for arthralgias, back pain and neck pain.  Skin: Negative.   Neurological: Negative for dizziness, seizures and speech difficulty.  Psychiatric/Behavioral: Negative for confusion and hallucinations.     Physical Exam Updated Vital Signs Ht 5\' 5"  (1.651 m)   Wt 52.2 kg   LMP 08/20/2019   BMI 19.14 kg/m   Physical Exam Vitals signs and nursing note reviewed. Exam conducted with a chaperone present.  Constitutional:      Appearance: She is well-developed. She is not toxic-appearing.  HENT:     Head: Normocephalic.     Right Ear: Tympanic membrane and external ear normal.     Left Ear: Tympanic membrane and external ear normal.  Eyes:     General: Lids are normal.     Pupils: Pupils are equal, round, and reactive to light.  Neck:     Musculoskeletal: Normal range of motion and neck supple.     Vascular: No carotid bruit.  Cardiovascular:     Rate and Rhythm: Normal rate and regular rhythm.     Pulses: Normal pulses.     Heart sounds: Normal heart sounds.  Pulmonary:     Effort: No respiratory distress.     Breath sounds: Normal breath sounds.  Abdominal:     General: Abdomen is flat. Bowel sounds are normal. There is no distension. There are no signs of injury.     Palpations: Abdomen is soft. There is no hepatomegaly, splenomegaly or pulsatile mass.     Tenderness: There is abdominal tenderness in the epigastric area and periumbilical area. There is left CVA tenderness. There is no guarding.     Hernia: There is no hernia in the left inguinal area or right inguinal area.  Genitourinary:    Exam position: Lithotomy position.     Pubic Area: No rash.      Labia:         Right: No tenderness or injury.        Left: No tenderness or injury.      Vagina: No signs of injury and foreign body. No vaginal discharge, erythema or bleeding.     Cervix: Cervical motion tenderness present. No friability or cervical bleeding.     Uterus: Tender.      Adnexa: Right adnexa normal and left adnexa normal.       Right: No mass or tenderness.  Left: No mass or tenderness.       Rectum: Guaiac result negative. External hemorrhoid present. No mass or anal fissure.     Comments: Small external hemorrhoid present. No bleeding. Not thrombosed   Musculoskeletal: Normal range of motion.  Lymphadenopathy:     Head:     Right side of head: No submandibular adenopathy.     Left side of head: No submandibular adenopathy.     Cervical: No cervical adenopathy.     Lower Body: No right inguinal adenopathy. No left inguinal adenopathy.  Skin:    General: Skin is warm and dry.  Neurological:     Mental Status: She is alert and oriented to person, place, and time.     Cranial Nerves: No cranial nerve deficit.     Sensory: No sensory deficit.  Psychiatric:        Speech: Speech normal.      ED Treatments / Results  Labs (all labs ordered are listed, but only abnormal results are displayed) Labs Reviewed - No data to display  EKG None  Radiology No results found.  Procedures Procedures (including critical care time)  Medications Ordered in ED Medications - No data to display   Initial Impression / Assessment and Plan / ED Course  I have reviewed the triage vital signs and the nursing notes.  Pertinent labs & imaging results that were available during my care of the patient were reviewed by me and considered in my medical decision making (see chart for details).          Final Clinical Impressions(s) / ED Diagnoses MDM  Vital signs within normal limits.  Pulse oximetry is 99% on room air.  Within normal limits by my interpretation.  I have  reviewed the previous records.  Previous CT questions cystitis and PID.  Also reconfirms hemangioma of the liver area.  Examination and ultrasound of the pelvis and transvaginal ultrasound performed at [facility] report no emergent findings or changes. GC chlamydia testing done at this facility at a earlier visit was negative for gonorrhea or chlamydia. Wet Prep from 10/1 -reviewed a few WBCs, otherwise all within normal limits.  Patient treated in the emergency department with intravenous Toradol and 5 mg of Compazine for the nausea.  Today stool for occult blood is negative. Complete blood count is well within normal limits.  Lipase is normal.  Comprehensive metabolic panel is normal.  Urine pregnancy test is negative.  Nausea has completely resolved.  Patient states pain is still present.  Urinalysis reveals a clear straw-colored specimen with a specific gravity 1.003.  There is a small amount of hemoglobin, improved from a large hemoglobin 2 weeks ago. The nitrates are negative, leukocyte esterase is negative.  The white blood cell count is 0-5, no bacteria seen..  Pelvic examination reveals some mild to moderate cervical wall motion tenderness.  No adnexal mass or significant adnexal tenderness or fullness.  Wet prep and GC chlamydia have been sent to the lab. Patient will be treated with intramuscular Rocephin and oral Zithromax.  Patient given 2 mg of IV morphine for pain she says she is still in significant pain.  Shortly after administration of the IV morphine, the patient told the nurse that she wanted the IV out and she wanted to leave.  She did receive the Rocephin and Zithromax.  I reviewed the discharge instructions with the patient including a negative wet prep.  The patient inquired if she would be receiving a prescription  for something stronger than Tylenol and ibuprofen.  I informed the patient that at this point we would use Tylenol and ibuprofen.  I strongly  encouraged her to see the GYN specialist next week as scheduled.  Patient is ambulatory at discharge.   Final diagnoses:  Pelvic pain    ED Discharge Orders    None       Lily Kocher, PA-C 09/10/19 1141    Isla Pence, MD 09/11/19 [phone]

## 2019-09-10 NOTE — ED Triage Notes (Signed)
Pt reports she was diagnosed with PID within the last month and has finished antibiotics that she was given. Pt c/o mid abdominal pain, nausea, pain "inside of me" that won't go away despite treatment over the last month. Pt reports she was recently seen at [facility] for same but they were unable to find the source of her pain.

## 2019-09-11 LAB — GC/CHLAMYDIA PROBE AMP (~~LOC~~) NOT AT ARMC
Chlamydia: NEGATIVE
Neisseria Gonorrhea: NEGATIVE

## 2020-08-16 ENCOUNTER — Other Ambulatory Visit: Payer: Self-pay

## 2020-08-16 ENCOUNTER — Ambulatory Visit
Admission: EM | Admit: 2020-08-16 | Discharge: 2020-08-16 | Disposition: A | Discharge status: Home / Self Care | Payer: Self-pay | Attending: Emergency Medicine | Admitting: Emergency Medicine

## 2020-08-16 ENCOUNTER — Encounter: Payer: Self-pay | Admitting: Emergency Medicine

## 2020-08-16 DIAGNOSIS — M62838 Other muscle spasm: Secondary | ICD-10-CM

## 2020-08-16 DIAGNOSIS — M549 Dorsalgia, unspecified: Secondary | ICD-10-CM

## 2020-08-16 DIAGNOSIS — S46811A Strain of other muscles, fascia and tendons at shoulder and upper arm level, right arm, initial encounter: Secondary | ICD-10-CM

## 2020-08-16 MED ORDER — CYCLOBENZAPRINE HCL 10 MG PO TABS: 10.0000 mg | ORAL_TABLET | Freq: Every day | ORAL | 0 refills | Status: DC

## 2020-08-16 MED ORDER — PREDNISONE 10 MG (21) PO TBPK: ORAL_TABLET | Freq: Every day | ORAL | 0 refills | Status: DC

## 2020-08-16 NOTE — ED Provider Notes (Signed)
[facility]   [id number] 08/16/20 Arrival Time: 1212  CC: RT shoulder pain  SUBJECTIVE: History from: patient. Lindsay Mccoy is a 34 y.o. female complains of RT shoulder blade pain x 4-5 days.  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  Was pulling corn dogs from freezer and had pain in RT shoulder blade.  Localizes the pain to the shoulder blade.  Describes the pain as intermittent and sharp in character.  Has tried OTC medications without relief.  Symptoms are made worse with ROM about the RT shoulder.  Denies similar symptoms in the past.  Denies fever, chills, erythema, ecchymosis, effusion, weakness, numbness and tingling.    ROS: As per HPI.  All other pertinent ROS negative.     Past Medical History:  Diagnosis Date  . Bronchitis   . Gastritis   . Scoliosis    Past Surgical History:  Procedure Laterality Date  . UPPER GI ENDOSCOPY     No Known Allergies No current facility-administered medications on file prior to encounter.   No current outpatient medications on file prior to encounter.   Social History   Socioeconomic History  . Marital status: Legally Separated    Spouse name: Not on file  . Number of children: Not on file  . Years of education: Not on file  . Highest education level: Not on file  Occupational History  . Not on file  Tobacco Use  . Smoking status: Former Smoker    Packs/day: 0.50    Years: 20.00    Pack years: 10.00    Types: Cigarettes    Quit date: 10/06/2015    Years since quitting: 4.8  . Smokeless tobacco: Former Clinical biochemist  . Vaping Use: Every day  Substance and Sexual Activity  . Alcohol use: Not Currently    Comment: occ  . Drug use: Never  . Sexual activity: Not on file  Other Topics Concern  . Not on file  Social History Narrative  . Not on file   Social Determinants of Health   Financial Resource Strain:   . Difficulty of Paying Living Expenses: Not on file  Food  Insecurity:   . Worried About Programme researcher, broadcasting/film/video in the Last Year: Not on file  . Ran Out of Food in the Last Year: Not on file  Transportation Needs:   . Lack of Transportation (Medical): Not on file  . Lack of Transportation (Non-Medical): Not on file  Physical Activity:   . Days of Exercise per Week: Not on file  . Minutes of Exercise per Session: Not on file  Stress:   . Feeling of Stress : Not on file  Social Connections:   . Frequency of Communication with Friends and Family: Not on file  . Frequency of Social Gatherings with Friends and Family: Not on file  . Attends Religious Services: Not on file  . Active Member of Clubs or Organizations: Not on file  . Attends Banker Meetings: Not on file  . Marital Status: Not on file  Intimate Partner Violence:   . Fear of Current or Ex-Partner: Not on file  . Emotionally Abused: Not on file  . Physically Abused: Not on file  . Sexually Abused: Not on file   No family history on file.  OBJECTIVE:  Vitals:   08/16/20 1259  BP: 111/72  Pulse: (!) 110  Resp: 17  Temp: 98.4 F (36.9 C)  TempSrc:  Tympanic  SpO2: 97%    General appearance: ALERT; in no acute distress.  Head: NCAT Lungs: Normal respiratory effort CV: radial pulse 2+; cap refill < 2 seconds Musculoskeletal: RT shoulder Inspection: Skin warm, dry, clear and intact without obvious erythema, effusion, or ecchymosis.  Palpation: TTP over RT lateral trapezius as well at RT upper back ROM: FROM active and passive Strength: 5/5 shld abduction, 5/5 shld adduction, 5/5 elbow flexion, 5/5 elbow extension, 5/5 grip strength Skin: warm and dry Neurologic: Ambulates without difficulty; Sensation intact about the upper extremities Psychological: alert and cooperative; normal mood and affect   ASSESSMENT & PLAN:  1. Trapezius strain, right, initial encounter   2. Trapezius muscle spasm   3. Upper back pain on right side     Meds ordered this encounter    Medications  . predniSONE (STERAPRED UNI-PAK 21 TAB) 10 MG (21) TBPK tablet    Sig: Take by mouth daily. Take 6 tabs by mouth daily  for 2 days, then 5 tabs for 2 days, then 4 tabs for 2 days, then 3 tabs for 2 days, 2 tabs for 2 days, then 1 tab by mouth daily for 2 days    Dispense:  42 tablet    Refill:  0    Order Specific Question:   Supervising Provider    Answer:   Eustace Moore [[id number]]  . cyclobenzaprine (FLEXERIL) 10 MG tablet    Sig: Take 1 tablet (10 mg total) by mouth at bedtime.    Dispense:  15 tablet    Refill:  0    Order Specific Question:   Supervising Provider    Answer:   Eustace Moore [[id number]]    Continue conservative management of rest, ice, and gentle stretches Prednisone prescribed.  Take as directed and to completion Sling given, wear as needed for comfort Take cyclobenzaprine at nighttime for symptomatic relief. Avoid driving or operating heavy machinery while using medication. Follow up with PCP or occupational health for reevaluation and management of work injury Return or go to the ER if you have any new or worsening symptoms (fever, chills, chest pain, redness, swelling, bruising deformity, etc...)   Reviewed expectations re: course of current medical issues. Questions answered. Outlined signs and symptoms indicating need for more acute intervention. Patient verbalized understanding. After Visit Summary given.    Rennis Harding, PA-C 08/16/20 1346

## 2020-08-16 NOTE — ED Notes (Signed)
Pt refused sling.

## 2020-08-16 NOTE — ED Triage Notes (Signed)
rt posterior shoulder pain after patient tried to pull a corn dog box out of her work freezer. pt states her posterior shoulder is now sore.

## 2020-08-16 NOTE — Discharge Instructions (Signed)
Continue conservative management of rest, ice, and gentle stretches Prednisone prescribed.  Take as directed and to completion Sling given, wear as needed for comfort Take cyclobenzaprine at nighttime for symptomatic relief. Avoid driving or operating heavy machinery while using medication. Follow up with PCP or occupational health for reevaluation and management of work injury Return or go to the ER if you have any new or worsening symptoms (fever, chills, chest pain, redness, swelling, bruising deformity, etc...)

## 2020-10-01 IMAGING — DX DG CHEST 2V
2 series · 2 of 2 positions shown · non-contrast
Comparison: 10/05/2018

CLINICAL DATA: Short of breath

EXAM:
CHEST - 2 VIEW

[chest pa]
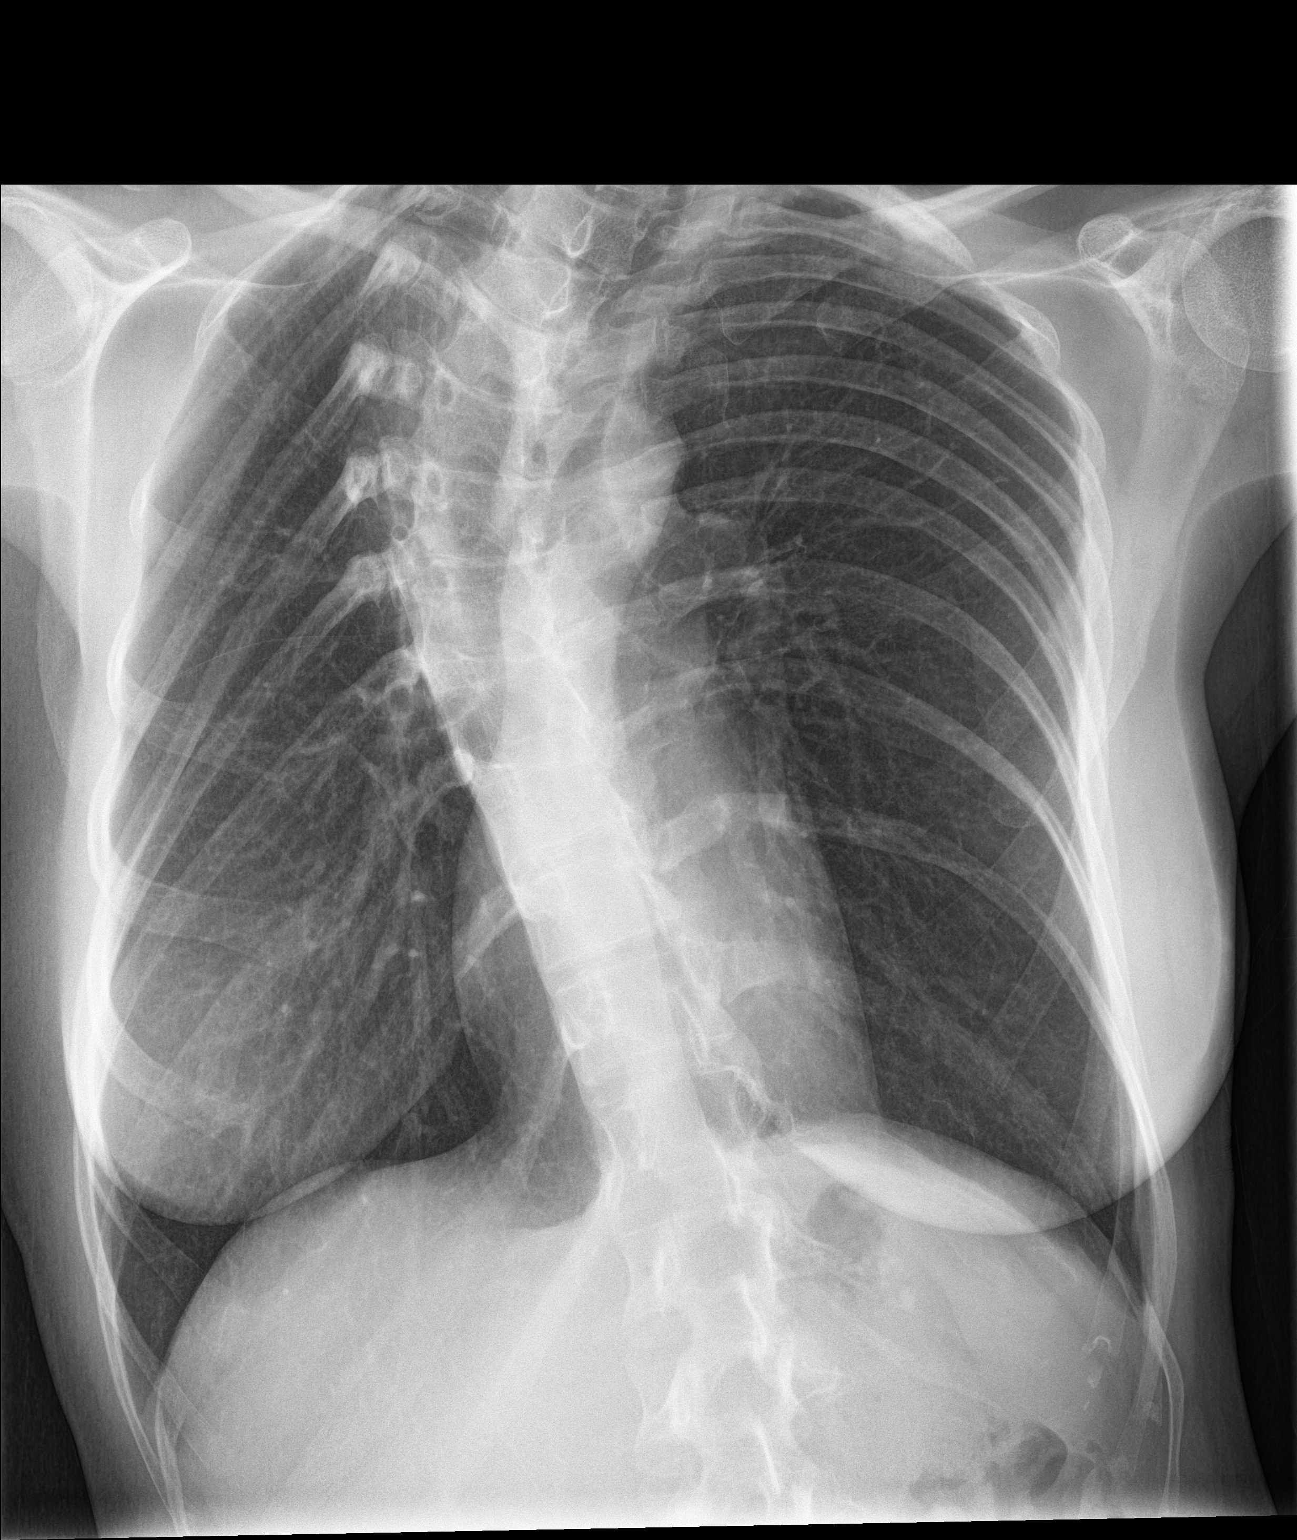

[chest lat]
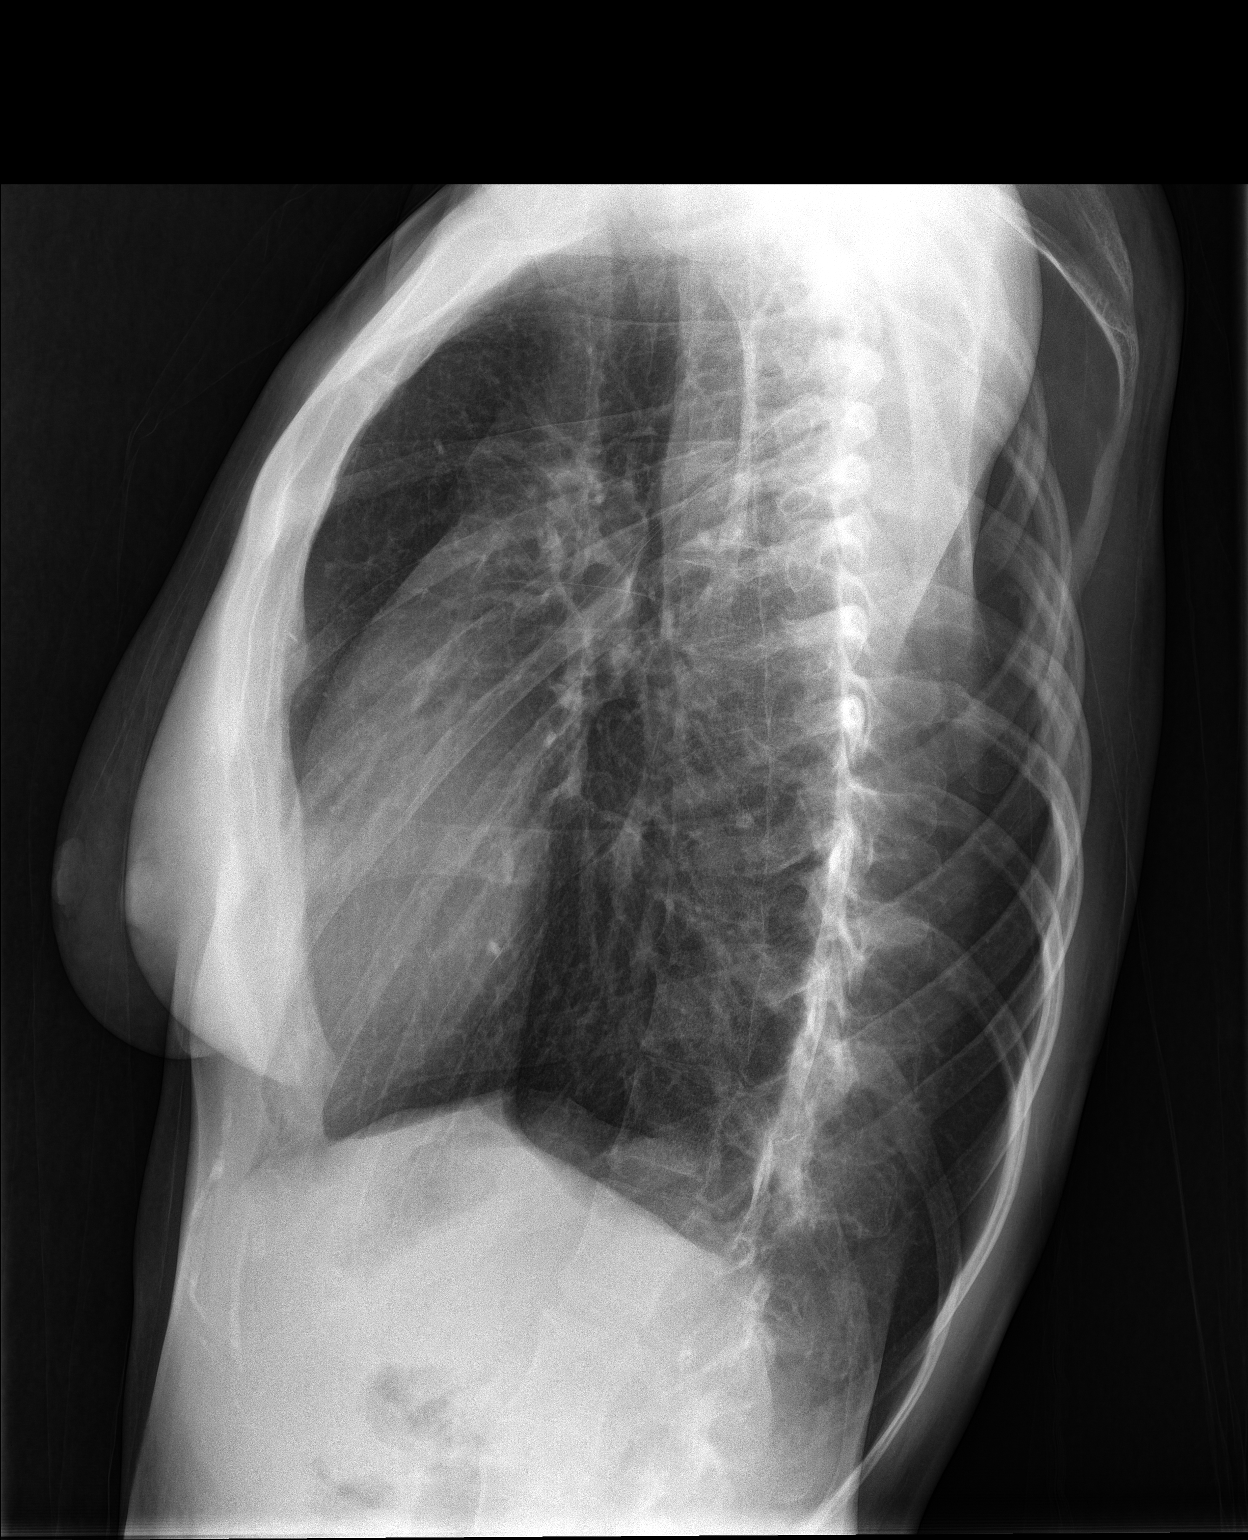

[2 of 2 positions shown; findings below may reference images not displayed]

FINDINGS: Normal heart size. Lungs clear. No pneumothorax. No pleural
effusion. Stable dextroscoliosis in the upper thoracic spine.
IMPRESSION: No active cardiopulmonary disease.

## 2020-10-11 ENCOUNTER — Encounter (HOSPITAL_COMMUNITY): Payer: Self-pay | Admitting: *Deleted

## 2020-10-11 ENCOUNTER — Other Ambulatory Visit: Payer: Self-pay

## 2020-10-11 ENCOUNTER — Emergency Department (HOSPITAL_COMMUNITY)
Admission: EM | Admit: 2020-10-11 | Discharge: 2020-10-11 | Disposition: A | Discharge status: Home / Self Care | Payer: Self-pay | Attending: Emergency Medicine | Admitting: Emergency Medicine

## 2020-10-11 DIAGNOSIS — J029 Acute pharyngitis, unspecified: Secondary | ICD-10-CM | POA: Insufficient documentation

## 2020-10-11 DIAGNOSIS — Z20822 Contact with and (suspected) exposure to covid-19: Secondary | ICD-10-CM | POA: Insufficient documentation

## 2020-10-11 DIAGNOSIS — Z87891 Personal history of nicotine dependence: Secondary | ICD-10-CM | POA: Insufficient documentation

## 2020-10-11 LAB — RESP PANEL BY RT-PCR (FLU A&B, COVID) ARPGX2
Influenza A by PCR: NEGATIVE
Influenza B by PCR: NEGATIVE
SARS Coronavirus 2 by RT PCR: NEGATIVE

## 2020-10-11 LAB — GROUP A STREP BY PCR: Group A Strep by PCR: NOT DETECTED

## 2020-10-11 NOTE — Clinical Social Work Note (Signed)
Transition of Care [facility]) - Emergency Department Mini Assessment  Patient Details  Name: Lindsay Mccoy MRN: [id number] Date of Birth: 1986-06-16  Transition of Care [facility]) CM/SW Contact:    Ewing Schlein, LCSW Phone Number: 10/11/2020, 1:33 PM  Clinical Narrative: Patient is a 34 year old female who presented to the ED for cough, sneezing, and sore throat. Per chart review, patient does not have insurance. CSW spoke with patient regarding referral to financial counselor to screen for Medicaid or other financial assistance program. Patient agreeable to referral. Patient confirmed she has a PCP, Dr. Noreene Filbert at [facility] in [place]. Patient referred to financial counselor. TOC signing off.  ED Mini Assessment: What brought you to the Emergency Department? : Cough, sore throat, sneezing Barriers to Discharge: Inadequate or no insurance Barrier interventions: Referral to financial counselor Means of departure: Car Interventions which prevented an admission or readmission: Other (must enter comment) (Referral to financial counselor)  Patient Contact and Communications Key Contact 1: Financial counselor  Jerene Dilling) Contact Date: 10/11/20     Call outcome: Referral made Choice offered to / list presented to : NA  Admission diagnosis:  COUGH,SNEEZING,SORETHROAT There are no problems to display for this patient.  PCP: Winona Legato (Adult Medicine - [facility]  [facility]: [address] [place], [place] [address])  Pharmacy:   [facility] [phone] - [place], [place] - [address] AT [facility] [address] & [place] [address] [place] [address] Phone: [phone] Fax: [phone]

## 2020-10-11 NOTE — Discharge Instructions (Addendum)
Return if any problems. Warm salt water gargles.  Tylenol every 4 hours

## 2020-10-11 NOTE — ED Triage Notes (Signed)
Pt c/o sore throat, dry cough, sneezing, body aches, SOB and hoarseness since Friday. Pt reports she has had both Moderna vaccinations about 2 months ago. Pt reports being exposed to someone with Covid recently.

## 2020-10-11 NOTE — ED Provider Notes (Signed)
[facility] EMERGENCY DEPARTMENT Provider Note   CSN: [id number] Arrival date & time: 10/11/20  4034     History Chief Complaint  Patient presents with  . Sore Throat    Lindsay Mccoy is a 34 y.o. female.  The history is provided by the patient. No language interpreter was used.  Sore Throat This is a new problem. The current episode started 2 days ago. The problem occurs constantly. The problem has been gradually worsening. Pertinent negatives include no shortness of breath. Nothing aggravates the symptoms. Nothing relieves the symptoms. She has tried nothing for the symptoms. The treatment provided no relief.  Pt reports voice is raspy  And sore.      Past Medical History:  Diagnosis Date  . Bronchitis   . Gastritis   . Scoliosis     There are no problems to display for this patient.   Past Surgical History:  Procedure Laterality Date  . UPPER GI ENDOSCOPY       OB History   No obstetric history on file.     No family history on file.  Social History   Tobacco Use  . Smoking status: Former Smoker    Packs/day: 0.50    Years: 20.00    Pack years: 10.00    Types: Cigarettes    Quit date: 10/06/2015    Years since quitting: 5.0  . Smokeless tobacco: Former Clinical biochemist  . Vaping Use: Every day  Substance Use Topics  . Alcohol use: Yes    Comment: occassionally   . Drug use: Never    Home Medications Prior to Admission medications   Medication Sig Start Date End Date Taking? Authorizing Provider  cyclobenzaprine (FLEXERIL) 10 MG tablet Take 1 tablet (10 mg total) by mouth at bedtime. 08/16/20   Wurst, [place], PA-C  predniSONE (STERAPRED UNI-PAK 21 TAB) 10 MG (21) TBPK tablet Take by mouth daily. Take 6 tabs by mouth daily  for 2 days, then 5 tabs for 2 days, then 4 tabs for 2 days, then 3 tabs for 2 days, 2 tabs for 2 days, then 1 tab by mouth daily for 2 days 08/16/20   Alvino Chapel [place], PA-C    Allergies    Patient has no known  allergies.  Review of Systems   Review of Systems  Respiratory: Negative for shortness of breath.   All other systems reviewed and are negative.   Physical Exam Updated Vital Signs BP 115/72   Pulse (!) 57   Temp 98.7 F (37.1 C)   Resp 18   Ht 5\' 6"  (1.676 m)   Wt 52.2 kg   LMP 10/02/2020   SpO2 100%   BMI 18.56 kg/m   Physical Exam Vitals and nursing note reviewed.  Constitutional:      Appearance: She is well-developed.  HENT:     Head: Normocephalic.     Mouth/Throat:     Pharynx: Posterior oropharyngeal erythema present.  Cardiovascular:     Rate and Rhythm: Normal rate.     Heart sounds: Normal heart sounds.  Pulmonary:     Effort: Pulmonary effort is normal.     Breath sounds: Normal breath sounds.  Abdominal:     General: There is no distension.  Musculoskeletal:        General: Normal range of motion.     Cervical back: Normal range of motion.  Skin:    General: Skin is warm.  Neurological:     Mental Status: She is  alert and oriented to person, place, and time.     ED Results / Procedures / Treatments   Labs (all labs ordered are listed, but only abnormal results are displayed) Labs Reviewed  RESP PANEL BY RT-PCR (FLU A&B, COVID) ARPGX2  GROUP A STREP BY PCR    EKG None  Radiology No results found.  Procedures Procedures (including critical care time)  Medications Ordered in ED Medications - No data to display  ED Course  I have reviewed the triage vital signs and the nursing notes.  Pertinent labs & imaging results that were available during my care of the patient were reviewed by me and considered in my medical decision making (see chart for details).    MDM Rules/Calculators/A&P                          MDM:  Pt afebrile, strep covid and influenza negative.  Pt advised tylenol. Drink plenty of fluids.  Return if any not improving Final Clinical Impression(s) / ED Diagnoses Final diagnoses:  Acute pharyngitis, unspecified  etiology    Rx / DC Orders ED Discharge Orders    None    An After Visit Summary was printed and given to the patient.    Elson Areas, [place] 10/11/20 1601    Margarita Grizzle, MD 10/12/20 1158

## 2020-10-24 ENCOUNTER — Emergency Department (HOSPITAL_COMMUNITY)
Admission: EM | Admit: 2020-10-24 | Discharge: 2020-10-24 | Disposition: A | Discharge status: Home / Self Care | Payer: Self-pay | Attending: Emergency Medicine | Admitting: Emergency Medicine

## 2020-10-24 ENCOUNTER — Other Ambulatory Visit: Payer: Self-pay

## 2020-10-24 ENCOUNTER — Encounter (HOSPITAL_COMMUNITY): Payer: Self-pay | Admitting: Emergency Medicine

## 2020-10-24 DIAGNOSIS — X100XXA Contact with hot drinks, initial encounter: Secondary | ICD-10-CM | POA: Insufficient documentation

## 2020-10-24 DIAGNOSIS — T2200XA Burn of unspecified degree of shoulder and upper limb, except wrist and hand, unspecified site, initial encounter: Secondary | ICD-10-CM

## 2020-10-24 DIAGNOSIS — T22032A Burn of unspecified degree of left upper arm, initial encounter: Secondary | ICD-10-CM | POA: Insufficient documentation

## 2020-10-24 DIAGNOSIS — Z87891 Personal history of nicotine dependence: Secondary | ICD-10-CM | POA: Insufficient documentation

## 2020-10-24 MED ORDER — HYDROCODONE-ACETAMINOPHEN 5-325 MG PO TABS
1.0000 | ORAL_TABLET | Freq: Four times a day (QID) | ORAL | 0 refills | Status: DC | PRN
Start: 2020-10-24 — End: 2021-02-02

## 2020-10-24 MED ORDER — BACITRACIN ZINC 500 UNIT/GM EX OINT: 1.0000 "application " | TOPICAL_OINTMENT | Freq: Two times a day (BID) | CUTANEOUS | 0 refills | Status: DC

## 2020-10-24 MED ORDER — OXYCODONE-ACETAMINOPHEN 5-325 MG PO TABS
1.0000 | ORAL_TABLET | Freq: Once | ORAL | Status: AC
  Administered 2020-10-24: 1 via ORAL
  Filled 2020-10-24: qty 1

## 2020-10-24 MED ORDER — KETOROLAC TROMETHAMINE 30 MG/ML IJ SOLN
30.0000 mg | Freq: Once | INTRAMUSCULAR | Status: AC
  Administered 2020-10-24: 30 mg via INTRAMUSCULAR
  Filled 2020-10-24: qty 1
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
## 2020-12-15 ENCOUNTER — Other Ambulatory Visit: Payer: Self-pay

## 2020-12-15 ENCOUNTER — Emergency Department (HOSPITAL_COMMUNITY)
Admission: EM | Admit: 2020-12-15 | Discharge: 2020-12-15 | Disposition: A | Discharge status: Home / Self Care | Payer: Self-pay | Attending: Emergency Medicine | Admitting: Emergency Medicine

## 2020-12-15 ENCOUNTER — Encounter (HOSPITAL_COMMUNITY): Payer: Self-pay

## 2020-12-15 DIAGNOSIS — U071 COVID-19: Secondary | ICD-10-CM | POA: Insufficient documentation

## 2020-12-15 DIAGNOSIS — Z87891 Personal history of nicotine dependence: Secondary | ICD-10-CM | POA: Insufficient documentation

## 2020-12-15 DIAGNOSIS — R1084 Generalized abdominal pain: Secondary | ICD-10-CM

## 2020-12-15 DIAGNOSIS — K219 Gastro-esophageal reflux disease without esophagitis: Secondary | ICD-10-CM | POA: Insufficient documentation

## 2020-12-15 LAB — CBC
HCT: 38.1 % (ref 36.0–46.0)
Hemoglobin: 11.5 g/dL — ABNORMAL LOW (ref 12.0–15.0)
MCH: 26.4 pg (ref 26.0–34.0)
MCHC: 30.2 g/dL (ref 30.0–36.0)
MCV: 87.4 fL (ref 80.0–100.0)
Platelets: 317 10*3/uL (ref 150–400)
RBC: 4.36 MIL/uL (ref 3.87–5.11)
RDW: 14.4 % (ref 11.5–15.5)
WBC: 6.8 10*3/uL (ref 4.0–10.5)
nRBC: 0 % (ref 0.0–0.2)

## 2020-12-15 LAB — LIPASE, BLOOD: Lipase: 36 U/L (ref 11–51)

## 2020-12-15 LAB — URINALYSIS, ROUTINE W REFLEX MICROSCOPIC
Bacteria, UA: NONE SEEN
Bilirubin Urine: NEGATIVE
Glucose, UA: NEGATIVE mg/dL
Ketones, ur: NEGATIVE mg/dL
Leukocytes,Ua: NEGATIVE
Nitrite: NEGATIVE
Protein, ur: NEGATIVE mg/dL
Specific Gravity, Urine: 1.008 (ref 1.005–1.030)
pH: 6 (ref 5.0–8.0)

## 2020-12-15 LAB — SARS CORONAVIRUS 2 (TAT 6-24 HRS): SARS Coronavirus 2: POSITIVE — AB

## 2020-12-15 LAB — COMPREHENSIVE METABOLIC PANEL
ALT: 16 U/L (ref 0–44)
AST: 18 U/L (ref 15–41)
Albumin: 3.6 g/dL (ref 3.5–5.0)
Alkaline Phosphatase: 47 U/L (ref 38–126)
Anion gap: 6 (ref 5–15)
BUN: 14 mg/dL (ref 6–20)
CO2: 24 mmol/L (ref 22–32)
Calcium: 8.6 mg/dL — ABNORMAL LOW (ref 8.9–10.3)
Chloride: 107 mmol/L (ref 98–111)
Creatinine, Ser: 0.7 mg/dL (ref 0.44–1.00)
GFR, Estimated: >60 mL/min (ref >60)
Glucose, Bld: 96 mg/dL (ref 70–99)
Potassium: 4.1 mmol/L (ref 3.5–5.1)
Sodium: 137 mmol/L (ref 135–145)
Total Bilirubin: 0.6 mg/dL (ref 0.3–1.2)
Total Protein: 7 g/dL (ref 6.5–8.1)

## 2020-12-15 MED ORDER — ONDANSETRON 4 MG PO TBDP: 4.0000 mg | ORAL_TABLET | Freq: Three times a day (TID) | ORAL | 0 refills | Status: DC | PRN

## 2020-12-15 MED ORDER — ALUM & MAG HYDROXIDE-SIMETH 200-200-20 MG/5ML PO SUSP
30.0000 mL | Freq: Once | ORAL | Status: AC
  Administered 2020-12-15: 30 mL via ORAL
  Filled 2020-12-15: qty 30

## 2020-12-15 MED ORDER — ONDANSETRON 4 MG PO TBDP
4.0000 mg | ORAL_TABLET | Freq: Once | ORAL | Status: AC
  Administered 2020-12-15: 4 mg via ORAL
  Filled 2020-12-15: qty 1

## 2020-12-15 MED ORDER — OMEPRAZOLE 20 MG PO CPDR: 20.0000 mg | DELAYED_RELEASE_CAPSULE | Freq: Two times a day (BID) | ORAL | 0 refills | Status: DC

## 2020-12-15 MED ORDER — LIDOCAINE VISCOUS HCL 2 % MT SOLN
15.0000 mL | Freq: Once | OROMUCOSAL | Status: AC
  Administered 2020-12-15: 15 mL via ORAL
  Filled 2020-12-15: qty 15

## 2020-12-15 NOTE — ED Provider Notes (Signed)
[facility] EMERGENCY DEPARTMENT Provider Note   CSN: [id number] Arrival date & time: 12/15/20  0530     History Chief Complaint  Patient presents with  . Abdominal Pain    Lindsay Mccoy is a 35 y.o. female with PMH of cystitis, questionable PID, hemangioma, and gastritis who presents the ED with a 2-week history of generalized abdominal discomfort.  I reviewed patient's medical record and she had spoken with OB/GYN on 10/05/2020 regarding asymptomatic STI testing.  On my examination, patient reports that she has been experiencing crampy periumbilical abdominal discomfort that waxes and wanes.  She states that it is relatively constant, but yesterday it began to feel more "sharp" in nature.  She states that her pain symptoms are exacerbated by eating and improved with Tums.  She endorses a history of gastritis and esophagitis noted on upper EGD, but states that she does not take any proton pump inhibitors regularly.  She states that yesterday she also had diminished appetite and p.o. intake with two episodes of nausea and nonbloody emesis.  She states that she has taken Zofran in the past, with good effect.  She states that she thinks it is a stomach bug which is why she had to leave work yesterday and is requesting a work note.  She states that her STI testing obtained two months ago was completely negative and she has continued to wear condoms with her monogamous female partner.  She felt "hot and cold" yesterday, but denies any documented fevers.  She does have a thermometer at home.  She denies any new or unusual vaginal discharge, vaginal bleeding, pelvic pain, dyspareunia, dysuria, increased urinary frequency, melena or hematochezia, chest pain or shortness of breath, cough, or obvious sick contacts.  She states that she has had congestion recently, but attributes it to her seasonal allergies.  Last BM was soft, brown.  Patient is s/p tubal ligation.  No other abdominal surgeries.  HPI      Past Medical History:  Diagnosis Date  . Bronchitis   . Gastritis   . Scoliosis     There are no problems to display for this patient.   Past Surgical History:  Procedure Laterality Date  . UPPER GI ENDOSCOPY       OB History   No obstetric history on file.     No family history on file.  Social History   Tobacco Use  . Smoking status: Former Smoker    Packs/day: 0.50    Years: 20.00    Pack years: 10.00    Types: Cigarettes    Quit date: 10/06/2015    Years since quitting: 5.1  . Smokeless tobacco: Former Clinical biochemist  . Vaping Use: Every day  Substance Use Topics  . Alcohol use: Yes    Comment: occassionally   . Drug use: Never    Home Medications Prior to Admission medications   Medication Sig Start Date End Date Taking? Authorizing Provider  omeprazole (PRILOSEC) 20 MG capsule Take 1 capsule (20 mg total) by mouth 2 (two) times daily before a meal. 12/15/20 01/14/21 Yes Natalea Sutliff, Sharion Settler, PA-C  ondansetron (ZOFRAN ODT) 4 MG disintegrating tablet Take 1 tablet (4 mg total) by mouth every 8 (eight) hours as needed for nausea or vomiting. 12/15/20  Yes Lorelee New, PA-C  bacitracin ointment Apply 1 application topically 2 (two) times daily. 10/24/20   Benjiman Core, MD  cyclobenzaprine (FLEXERIL) 10 MG tablet Take 1 tablet (10 mg total) by mouth at  bedtime. 08/16/20   Wurst, [place], PA-C  HYDROcodone-acetaminophen (NORCO/VICODIN) 5-325 MG tablet Take 1-2 tablets by mouth every 6 (six) hours as needed. 10/24/20   Benjiman Core, MD  predniSONE (STERAPRED UNI-PAK 21 TAB) 10 MG (21) TBPK tablet Take by mouth daily. Take 6 tabs by mouth daily  for 2 days, then 5 tabs for 2 days, then 4 tabs for 2 days, then 3 tabs for 2 days, 2 tabs for 2 days, then 1 tab by mouth daily for 2 days 08/16/20   Alvino Chapel [place], PA-C    Allergies    Patient has no known allergies.  Review of Systems   Review of Systems  All other systems reviewed and are  negative.   Physical Exam Updated Vital Signs BP (!) 108/54 (BP Location: Right Arm)   Pulse 62   Temp 98.3 F (36.8 C) (Oral)   Resp 18   Ht 5\' 5"  (1.651 m)   Wt 54.4 kg   LMP 12/01/2020   SpO2 100%   BMI 19.97 kg/m   Physical Exam Vitals and nursing note reviewed. Exam conducted with a chaperone present.  Constitutional:      General: She is not in acute distress.    Appearance: She is not toxic-appearing.  HENT:     Head: Normocephalic and atraumatic.  Eyes:     General: No scleral icterus.    Conjunctiva/sclera: Conjunctivae normal.  Cardiovascular:     Rate and Rhythm: Normal rate and regular rhythm.     Pulses: Normal pulses.  Pulmonary:     Effort: Pulmonary effort is normal. No respiratory distress.     Breath sounds: Normal breath sounds. No wheezing or rales.  Abdominal:     General: Abdomen is flat. There is no distension.     Palpations: Abdomen is soft. There is no mass.     Tenderness: There is no abdominal tenderness. There is no guarding.     Hernia: No hernia is present.     Comments: Soft, nondistended.  No areas of tenderness.  No guarding.  No overlying skin changes.  Normoactive bowel sounds.  Skin:    General: Skin is dry.  Neurological:     Mental Status: She is alert.     GCS: GCS eye subscore is 4. GCS verbal subscore is 5. GCS motor subscore is 6.  Psychiatric:        Mood and Affect: Mood normal.        Behavior: Behavior normal.        Thought Content: Thought content normal.     ED Results / Procedures / Treatments   Labs (all labs ordered are listed, but only abnormal results are displayed) Labs Reviewed  COMPREHENSIVE METABOLIC PANEL - Abnormal; Notable for the following components:      Result Value   Calcium 8.6 (*)    All other components within normal limits  CBC - Abnormal; Notable for the following components:   Hemoglobin 11.5 (*)    All other components within normal limits  SARS CORONAVIRUS 2 (TAT 6-24 HRS)   LIPASE, BLOOD  URINALYSIS, ROUTINE W REFLEX MICROSCOPIC  POC URINE PREG, ED    EKG None  Radiology No results found.  Procedures Procedures   Medications Ordered in ED Medications  alum & mag hydroxide-simeth (MAALOX/MYLANTA) 200-200-20 MG/5ML suspension 30 mL (30 mLs Oral Given 12/15/20 0954)    And  lidocaine (XYLOCAINE) 2 % viscous mouth solution 15 mL (15 mLs Oral Given 12/15/20 0954)  ondansetron (  ZOFRAN-ODT) disintegrating tablet 4 mg (4 mg Oral Given 12/15/20 4332)    ED Course  I have reviewed the triage vital signs and the nursing notes.  Pertinent labs & imaging results that were available during my care of the patient were reviewed by me and considered in my medical decision making (see chart for details).    MDM Rules/Calculators/A&P                          Lindsay Mccoy was evaluated in Emergency Department on 12/15/2020 for the symptoms described in the history of present illness. She was evaluated in the context of the global COVID-19 pandemic, which necessitated consideration that the patient might be at risk for infection with the SARS-CoV-2 virus that causes COVID-19. Institutional protocols and algorithms that pertain to the evaluation of patients at risk for COVID-19 are in a state of rapid change based on information released by regulatory bodies including the CDC and federal and state organizations. These policies and algorithms were followed during the patient's care in the ED.  I personally reviewed patient's medical chart and all notes from triage and staff during today's encounter. I have also ordered and reviewed all labs and imaging that I felt to be medically necessary in the evaluation of this patient's complaints and with consideration of their with their physical exam. If needed, translation services were available and utilized.   Patient's abdominal discomfort that is worse with eating and improved with Tums antacid in context of known GERD with  esophagitis is suggestive of gastritis.  She denies any melena, tobacco use, or alcohol use.  She does not take NSAIDs or aspirin.  No blood thinners.  Lower suspicion for PUD.  She also has congestion on my examination and is endorsing diminished appetite in addition to nausea symptoms.  Suspect that she may have viral illness.  Will also obtain COVID-19 testing.  Do not feel as though CT is warranted given reassuring HPI as well as normal laboratory work-up and physical exam.  Patient agrees.  I also do not feel as though pelvic exam or wet prep is warranted given her lack of any new or different discharge.  She denies any vaginal/pelvic pain and informs me that she recently had comprehensive STI testing which was negative.  She agrees that pelvic exam is not warranted.  We will encourage patient to restart pantoprazole on outpatient basis and take Maalox as needed.  We will also discharge her home with Zofran ODT for her nausea symptoms.  Emphasized the importance of increased oral hydration and regular meals.   This patient presents with abdominal discomfort of unclear etiology. Their evaluation has not identified a emergent etiology for the abdominal pain. Specifically, given the very benign exam, normal laboratory studies, and lack of significant risk factors, I have a very low suspicion for appendicitis, ischemic bowel, bowel perforation, or any other life threatening disease. I have discussed with the patient the level of uncertainty with undifferentiated abdominal pain and clearly explained the need to follow-up as noted on the discharge instructions, or return to the Emergency Department immediately if the pain worsens, develops fever, persistent and uncontrollable vomiting, or for any new symptoms or concerns. I discussed with the patient that this presentation today for abdominal pain could represent a significant risk for an acute abdominal process. Although the tests in the ED were essentially  normal, there is still a possibility of a process such as appendicitis, diverticulitis, cholecystitis,  ulcer, early bowel obstruction, mesenteric ischemia, kidney stone, or even kidney infection which could subsequently cause disability or death. The patient understands that they must return within 24 hours for a recheck or see their physician within 24 hours for re-exam due to the possibility of significant surgical or medical process.   Final Clinical Impression(s) / ED Diagnoses Final diagnoses:  Generalized abdominal discomfort  Gastroesophageal reflux disease, unspecified whether esophagitis present    Rx / DC Orders ED Discharge Orders         Ordered    ondansetron (ZOFRAN ODT) 4 MG disintegrating tablet  Every 8 hours PRN        12/15/20 0935    omeprazole (PRILOSEC) 20 MG capsule  2 times daily before meals        12/15/20 0936           Lorelee New, PA-C 12/15/20 1102    Pricilla Loveless, MD 12/16/20 0730

## 2020-12-15 NOTE — ED Triage Notes (Signed)
Pt presents to ED with complaints of generalized abdominal pain for past couple weeks, worst yesterday, diarrhea and nausea present, no vomiting.

## 2020-12-15 NOTE — Discharge Instructions (Signed)
Your history and physical exam is suggestive of viral enteritis versus gastritis.  I understand the history of gastritis, please take the Prilosec, as directed.  Use the Zofran ODT as needed for nausea symptoms and continue with Tums versus Maalox over-the-counter as an abortive therapy for her abdominal discomfort.  Your laboratory work-up is all very reassuring.  It is important that you follow-up with your primary care provider regarding today's encounter.  If you continue to have abdominal symptoms, you may ultimately require referral to gastroenterologist for further evaluation and management.  Return to the ED or seek immediate medical attention should you experience any new or worsening symptoms.

## 2020-12-24 ENCOUNTER — Encounter (HOSPITAL_COMMUNITY): Payer: Self-pay

## 2020-12-24 ENCOUNTER — Emergency Department (HOSPITAL_COMMUNITY)
Admission: EM | Admit: 2020-12-24 | Discharge: 2020-12-24 | Disposition: A | Discharge status: Home / Self Care | Payer: No Typology Code available for payment source | Attending: Emergency Medicine | Admitting: Emergency Medicine

## 2020-12-24 ENCOUNTER — Emergency Department (HOSPITAL_COMMUNITY): Payer: No Typology Code available for payment source

## 2020-12-24 ENCOUNTER — Other Ambulatory Visit: Payer: Self-pay

## 2020-12-24 DIAGNOSIS — S0083XA Contusion of other part of head, initial encounter: Secondary | ICD-10-CM | POA: Insufficient documentation

## 2020-12-24 DIAGNOSIS — F17290 Nicotine dependence, other tobacco product, uncomplicated: Secondary | ICD-10-CM | POA: Insufficient documentation

## 2020-12-24 DIAGNOSIS — S0990XA Unspecified injury of head, initial encounter: Secondary | ICD-10-CM | POA: Diagnosis not present

## 2020-12-24 LAB — I-STAT BETA HCG BLOOD, ED (MC, WL, AP ONLY): Blood: NEGATIVE

## 2020-12-24 MED ORDER — OXYCODONE-ACETAMINOPHEN 5-325 MG PO TABS
1.0000 | ORAL_TABLET | Freq: Three times a day (TID) | ORAL | 0 refills | Status: AC | PRN
Start: 2020-12-24 — End: 2020-12-27

## 2020-12-24 MED ORDER — OXYCODONE-ACETAMINOPHEN 5-325 MG PO TABS
1.0000 | ORAL_TABLET | Freq: Three times a day (TID) | ORAL | 0 refills | Status: DC | PRN
Start: 2020-12-24 — End: 2020-12-24

## 2020-12-24 MED ORDER — OXYCODONE-ACETAMINOPHEN 5-325 MG PO TABS
1.0000 | ORAL_TABLET | Freq: Once | ORAL | Status: AC
  Administered 2020-12-24: 1 via ORAL
  Filled 2020-12-24: qty 1

## 2020-12-24 NOTE — ED Notes (Signed)
Pt transported to CT by CT staff via stretcher at this time.  
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
## 2020-12-24 NOTE — ED Notes (Signed)
Pt transported to xray at this time

## 2020-12-24 NOTE — ED Notes (Addendum)
Entered room and introduced self to patient. Pt appears to be resting in bed, respirations are even and unlabored with equal chest rise and fall. Bed is locked in the lowest position, side rails x2, call bell within reach. Pt educated on call light use and hourly rounding, verbalized understanding and in agreement at this time. All questions and concerns voiced addressed. Refreshments offered and provided per patient request.  

## 2020-12-24 NOTE — ED Notes (Signed)
ED Provider at bedside. 

## 2021-02-02 ENCOUNTER — Emergency Department (HOSPITAL_COMMUNITY): Payer: Self-pay

## 2021-02-02 ENCOUNTER — Other Ambulatory Visit: Payer: Self-pay

## 2021-02-02 ENCOUNTER — Emergency Department (HOSPITAL_COMMUNITY)
Admission: EM | Admit: 2021-02-02 | Discharge: 2021-02-02 | Disposition: A | Discharge status: Home / Self Care | Payer: Self-pay | Attending: Emergency Medicine | Admitting: Emergency Medicine

## 2021-02-02 DIAGNOSIS — R1084 Generalized abdominal pain: Secondary | ICD-10-CM | POA: Insufficient documentation

## 2021-02-02 DIAGNOSIS — Z87891 Personal history of nicotine dependence: Secondary | ICD-10-CM | POA: Insufficient documentation

## 2021-02-02 DIAGNOSIS — R059 Cough, unspecified: Secondary | ICD-10-CM | POA: Insufficient documentation

## 2021-02-02 DIAGNOSIS — R0789 Other chest pain: Secondary | ICD-10-CM | POA: Insufficient documentation

## 2021-02-02 DIAGNOSIS — M546 Pain in thoracic spine: Secondary | ICD-10-CM | POA: Insufficient documentation

## 2021-02-02 DIAGNOSIS — R112 Nausea with vomiting, unspecified: Secondary | ICD-10-CM | POA: Insufficient documentation

## 2021-02-02 DIAGNOSIS — R1013 Epigastric pain: Secondary | ICD-10-CM

## 2021-02-02 DIAGNOSIS — R0602 Shortness of breath: Secondary | ICD-10-CM | POA: Insufficient documentation

## 2021-02-02 LAB — HCG, SERUM, QUALITATIVE: Preg, Serum: NEGATIVE

## 2021-02-02 LAB — CBC WITH DIFFERENTIAL/PLATELET
Abs Immature Granulocytes: 0.02 10*3/uL (ref 0.00–0.07)
Basophils Absolute: 0.1 10*3/uL (ref 0.0–0.1)
Basophils Relative: 1 %
Eosinophils Absolute: 0.1 10*3/uL (ref 0.0–0.5)
Eosinophils Relative: 1 %
HCT: 40.2 % (ref 36.0–46.0)
Hemoglobin: 12.4 g/dL (ref 12.0–15.0)
Immature Granulocytes: 0 %
Lymphocytes Relative: 39 %
Lymphs Abs: 2.4 10*3/uL (ref 0.7–4.0)
MCH: 26.8 pg (ref 26.0–34.0)
MCHC: 30.8 g/dL (ref 30.0–36.0)
MCV: 87 fL (ref 80.0–100.0)
Monocytes Absolute: 0.5 10*3/uL (ref 0.1–1.0)
Monocytes Relative: 9 %
Neutro Abs: 3 10*3/uL (ref 1.7–7.7)
Neutrophils Relative %: 50 %
Platelets: 319 10*3/uL (ref 150–400)
RBC: 4.62 MIL/uL (ref 3.87–5.11)
RDW: 15 % (ref 11.5–15.5)
WBC: 6 10*3/uL (ref 4.0–10.5)
nRBC: 0 % (ref 0.0–0.2)

## 2021-02-02 LAB — COMPREHENSIVE METABOLIC PANEL
ALT: 15 U/L (ref 0–44)
AST: 18 U/L (ref 15–41)
Albumin: 3.9 g/dL (ref 3.5–5.0)
Alkaline Phosphatase: 48 U/L (ref 38–126)
Anion gap: 6 (ref 5–15)
BUN: 15 mg/dL (ref 6–20)
CO2: 25 mmol/L (ref 22–32)
Calcium: 8.5 mg/dL — ABNORMAL LOW (ref 8.9–10.3)
Chloride: 105 mmol/L (ref 98–111)
Creatinine, Ser: 0.66 mg/dL (ref 0.44–1.00)
GFR, Estimated: >60 mL/min (ref >60)
Glucose, Bld: 88 mg/dL (ref 70–99)
Potassium: 3.9 mmol/L (ref 3.5–5.1)
Sodium: 136 mmol/L (ref 135–145)
Total Bilirubin: 0.6 mg/dL (ref 0.3–1.2)
Total Protein: 7.7 g/dL (ref 6.5–8.1)

## 2021-02-02 LAB — D-DIMER, QUANTITATIVE: D-Dimer, Quant: 1.04 ug/mL-FEU — ABNORMAL HIGH (ref 0.00–0.50)

## 2021-02-02 LAB — TROPONIN I (HIGH SENSITIVITY)
Troponin I (High Sensitivity): <2 ng/L (ref <18)
Troponin I (High Sensitivity): <2 ng/L (ref <18)

## 2021-02-02 LAB — LIPASE, BLOOD: Lipase: 35 U/L (ref 11–51)

## 2021-02-02 MED ORDER — LIDOCAINE 5 % EX PTCH: 1.0000 | MEDICATED_PATCH | CUTANEOUS | 0 refills | Status: DC

## 2021-02-02 MED ORDER — IOHEXOL 350 MG/ML SOLN
100.0000 mL | Freq: Once | INTRAVENOUS | Status: AC | PRN
  Administered 2021-02-02: 100 mL via INTRAVENOUS

## 2021-02-02 MED ORDER — METHOCARBAMOL 500 MG PO TABS: 500.0000 mg | ORAL_TABLET | Freq: Two times a day (BID) | ORAL | 0 refills | Status: DC

## 2021-02-02 MED ORDER — ONDANSETRON 4 MG PO TBDP: 4.0000 mg | ORAL_TABLET | Freq: Three times a day (TID) | ORAL | 0 refills | Status: DC | PRN

## 2021-02-02 MED ORDER — SODIUM CHLORIDE 0.9 % IV BOLUS
1000.0000 mL | Freq: Once | INTRAVENOUS | Status: AC
  Administered 2021-02-02: 1000 mL via INTRAVENOUS

## 2021-02-02 MED ORDER — MORPHINE SULFATE (PF) 4 MG/ML IV SOLN
4.0000 mg | Freq: Once | INTRAVENOUS | Status: AC
  Administered 2021-02-02: 4 mg via INTRAVENOUS
  Filled 2021-02-02: qty 1

## 2021-02-02 MED ORDER — ONDANSETRON HCL 4 MG/2ML IJ SOLN
4.0000 mg | Freq: Once | INTRAMUSCULAR | Status: AC
Start: 2021-02-02 — End: 2021-02-02
  Administered 2021-02-02: 4 mg via INTRAVENOUS
  Filled 2021-02-02: qty 2

## 2021-02-02 MED ORDER — SUCRALFATE 1 G PO TABS: 1.0000 g | ORAL_TABLET | Freq: Three times a day (TID) | ORAL | 0 refills | Status: DC

## 2021-02-02 NOTE — ED Provider Notes (Addendum)
[facility] EMERGENCY DEPARTMENT Provider Note   CSN: [id number] Arrival date & time: 02/02/21  1140     History Chief Complaint  Patient presents with  . Back Pain    Lindsay Dolores Hoosekens is a 35 y.o. female with medical history significant for recurrent gastritis, scoliosis, back pain who presents for evaluation multiple complaints.  Yesterday she felt like she had some pain between her shoulder blades.  Does have known history of chronic pain here due to her scoliosis.  She supposed help with orthopedics.  Patient states his pain feels different. Worse with movement.  She has had occasional intermittent chest tightness, shortness of breath. No current CP. Pain is only pleuritic in nature. Non exhertional. No prior history of PE, DVT, unilateral leg swelling, redness or warmth.  Occasionally has a nonproductive cough.  Had COVID 2 months ago.  No known sick contacts.  Has had some chills without fever.  States she was unable to get comfortable last night and had to call out of work today.  Patient states she is also has had emergent episodes of nausea and vomiting.  Not associated with food intake.  States she is able to eat some however cannot eat to "feel full."  Denies any associated pain.  No hemoptysis, abdominal pain, diarrhea, dysuria, hematuria, pelvic pain, vaginal discharge.  Denies additional aggravating or alleviating factors.  Has not take anything for symptoms.  No history of chronic NSAID use, IV drug use, bowel or bladder incontinence, saddle paresthesia or malignancy.  History obtained from patient and past medical records.  No interpreter used  HPI     Past Medical History:  Diagnosis Date  . Bronchitis   . Gastritis   . Scoliosis     There are no problems to display for this patient.   Past Surgical History:  Procedure Laterality Date  . UPPER GI ENDOSCOPY       OB History   No obstetric history on file.     No family history on file.  Social History   Tobacco  Use  . Smoking status: Former Smoker    Packs/day: 0.50    Years: 20.00    Pack years: 10.00    Types: Cigarettes    Quit date: 10/06/2015    Years since quitting: 5.3  . Smokeless tobacco: Never Used  Vaping Use  . Vaping Use: Every day  Substance Use Topics  . Alcohol use: Yes    Comment: occassionally   . Drug use: Never    Home Medications Prior to Admission medications   Medication Sig Start Date End Date Taking? Authorizing Provider  acetaminophen (TYLENOL) 325 MG tablet Take 325 mg by mouth every 6 (six) hours as needed for mild pain, moderate pain or fever.   Yes [provider]  ibuprofen (ADVIL) 200 MG tablet Take 200 mg by mouth every 6 (six) hours as needed for fever, headache or mild pain.   Yes [provider]  lidocaine (LIDODERM) 5 % Place 1 patch onto the skin daily. Remove & Discard patch within 12 hours or as directed by MD 02/02/21  Yes Henderly, Britni A, PA-C  methocarbamol (ROBAXIN) 500 MG tablet Take 1 tablet (500 mg total) by mouth 2 (two) times daily. 02/02/21  Yes Henderly, Britni A, PA-C  ondansetron (ZOFRAN ODT) 4 MG disintegrating tablet Take 1 tablet (4 mg total) by mouth every 8 (eight) hours as needed for nausea or vomiting. 02/02/21  Yes Henderly, Britni A, PA-C  sucralfate (CARAFATE)  1 g tablet Take 1 tablet (1 g total) by mouth 4 (four) times daily -  with meals and at bedtime for 10 days. 02/02/21 02/12/21 Yes Henderly, Britni A, PA-C  omeprazole (PRILOSEC) 20 MG capsule Take 1 capsule (20 mg total) by mouth 2 (two) times daily before a meal. 12/15/20 02/02/21  Lorelee New, PA-C    Allergies    Patient has no known allergies.  Review of Systems   Review of Systems  Constitutional: Negative.   HENT: Negative.   Respiratory: Positive for cough and shortness of breath. Negative for apnea, choking, chest tightness, wheezing and stridor.   Cardiovascular: Positive for chest pain (Intermittent). Negative for palpitations and leg  swelling.  Gastrointestinal: Positive for nausea and vomiting. Negative for abdominal distention, abdominal pain, anal bleeding, blood in stool, constipation, diarrhea and rectal pain.  Genitourinary: Negative.   Musculoskeletal: Positive for back pain. Negative for gait problem, neck pain and neck stiffness.  Skin: Negative.   Neurological: Negative.   All other systems reviewed and are negative.   Physical Exam Updated Vital Signs BP 111/71   Pulse 60   Temp 98.4 F (36.9 C) (Oral)   Resp 16   Ht  (1.651 m)   Wt 55.8 kg   SpO2 100%   BMI 20.47 kg/m   Physical Exam Vitals and nursing note reviewed.  Constitutional:      General: She is not in acute distress.    Appearance: She is well-developed. She is not ill-appearing, toxic-appearing or diaphoretic.  HENT:     Head: Normocephalic and atraumatic.     Nose: Nose normal.     Mouth/Throat:     Mouth: Mucous membranes are moist.  Eyes:     Pupils: Pupils are equal, round, and reactive to light.  Cardiovascular:     Rate and Rhythm: Normal rate.     Pulses: Normal pulses.          Radial pulses are 2+ on the right side and 2+ on the left side.       Dorsalis pedis pulses are 2+ on the right side and 2+ on the left side.     Heart sounds: Normal heart sounds.  Pulmonary:     Effort: Pulmonary effort is normal. No respiratory distress.     Breath sounds: Normal breath sounds.     Comments: Speaks in full sentences without difficulty. Clear to auscultation bilaterally. Abdominal:     General: Bowel sounds are normal. There is no distension.     Palpations: Abdomen is soft.     Tenderness: There is no abdominal tenderness. There is no right CVA tenderness, left CVA tenderness, guarding or rebound. Negative signs include Murphy's sign and McBurney's sign.     Hernia: No hernia is present.     Comments: Soft, nontender without rebound or guarding.  Negative Murphy sign.  Musculoskeletal:        General: Normal range of  motion.     Cervical back: Normal range of motion.       Back:     Comments: Moves all 4 extremities at difficulty.  Compartments soft. Denna Haggard' sign negative.  No bony tenderness.  No midline spinal tenderness  Skin:    General: Skin is warm and dry.     Capillary Refill: Capillary refill takes less than 2 seconds.     Comments: No edema, erythema or warmth. No fluctuance or induration.  Neurological:     Mental Status: She is  alert.     Comments: Cn 2-12 grossly intact    ED Results / Procedures / Treatments   Labs (all labs ordered are listed, but only abnormal results are displayed) Labs Reviewed  COMPREHENSIVE METABOLIC PANEL - Abnormal; Notable for the following components:      Result Value   Calcium 8.5 (*)    All other components within normal limits  D-DIMER, QUANTITATIVE - Abnormal; Notable for the following components:   D-Dimer, Quant 1.04 (*)    All other components within normal limits  CBC WITH DIFFERENTIAL/PLATELET  LIPASE, BLOOD  HCG, SERUM, QUALITATIVE  TROPONIN I (HIGH SENSITIVITY)  TROPONIN I (HIGH SENSITIVITY)    EKG EKG Interpretation  Date/Time:  Wednesday February 02 2021 12:55:41 EDT Ventricular Rate:  59 PR Interval:    QRS Duration: 62 QT Interval:  406 QTC Calculation: 403 R Axis:   78 Text Interpretation: Sinus rhythm Inferior infarct, acute (LCx) Lateral leads are also involved similar to prior EKG on Nov 2021 Confirmed by Norman Clay ([id number]) on 02/02/2021 12:59:47 PM   Radiology CT Angio Chest PE W and/or Wo Contrast  Result Date: 02/02/2021 CLINICAL DATA:  Acute generalized abdominal pain, shortness of breath. EXAM: CT ANGIOGRAPHY CHEST CT ABDOMEN AND PELVIS WITH CONTRAST TECHNIQUE: Multidetector CT imaging of the chest was performed using the standard protocol during bolus administration of intravenous contrast. Multiplanar CT image reconstructions and MIPs were obtained to evaluate the vascular anatomy. Multidetector CT imaging of the  abdomen and pelvis was performed using the standard protocol during bolus administration of intravenous contrast. CONTRAST:  OMNIPAQUE IOHEXOL 350 MG/ML SOLN COMPARISON:  August 21, 2019. FINDINGS: CTA CHEST FINDINGS Cardiovascular: Satisfactory opacification of the pulmonary arteries to the segmental level. No evidence of pulmonary embolism. Normal heart size. No pericardial effusion. Mediastinum/Nodes: No enlarged mediastinal, hilar, or axillary lymph nodes. Thyroid gland, trachea, and esophagus demonstrate no significant findings. Lungs/Pleura: Lungs are clear. No pleural effusion or pneumothorax. Musculoskeletal: No chest wall abnormality. No acute or significant osseous findings. Review of the MIP images confirms the above findings. CT ABDOMEN and PELVIS FINDINGS Hepatobiliary: No gallstones are noted. No biliary dilatation is noted. 7 x 5 cm rounded abnormality is again noted in the left hepatic lobe which is not significantly changed in size compared to prior exam and demonstrates peripheral nodular enhancement, most consistent with hemangioma. Pancreas: Unremarkable. No pancreatic ductal dilatation or surrounding inflammatory changes. Spleen: Normal in size without focal abnormality. Adrenals/Urinary Tract: Adrenal glands are unremarkable. Kidneys are normal, without renal calculi, focal lesion, or hydronephrosis. Bladder is unremarkable. Stomach/Bowel: Stomach is within normal limits. Appendix appears normal. No evidence of bowel wall thickening, distention, or inflammatory changes. Vascular/Lymphatic: No significant vascular findings are present. No enlarged abdominal or pelvic lymph nodes. Reproductive: Uterus is unremarkable. Occlusion coils are noted in both fallopian tubes. No adnexal abnormality is noted. Other: No abdominal wall hernia or abnormality. No abdominopelvic ascites. Musculoskeletal: No acute or significant osseous findings. Review of the MIP images confirms the above findings.  IMPRESSION: 1. No definite evidence of pulmonary embolus. 2. No acute abnormality seen in the chest, abdomen or pelvis. 3. 7 x 5 cm rounded abnormality is again noted in the left hepatic lobe which is not significantly changed in size compared to prior exam and demonstrates peripheral nodular enhancement, most consistent with hemangioma. Electronically Signed   By: Lupita Raider M.D.   On: 02/02/2021 15:56   CT Abdomen Pelvis W Contrast  Result Date: 02/02/2021 CLINICAL DATA:  Acute generalized abdominal pain, shortness of breath. EXAM: CT ANGIOGRAPHY CHEST CT ABDOMEN AND PELVIS WITH CONTRAST TECHNIQUE: Multidetector CT imaging of the chest was performed using the standard protocol during bolus administration of intravenous contrast. Multiplanar CT image reconstructions and MIPs were obtained to evaluate the vascular anatomy. Multidetector CT imaging of the abdomen and pelvis was performed using the standard protocol during bolus administration of intravenous contrast. CONTRAST:  OMNIPAQUE IOHEXOL 350 MG/ML SOLN COMPARISON:  August 21, 2019. FINDINGS: CTA CHEST FINDINGS Cardiovascular: Satisfactory opacification of the pulmonary arteries to the segmental level. No evidence of pulmonary embolism. Normal heart size. No pericardial effusion. Mediastinum/Nodes: No enlarged mediastinal, hilar, or axillary lymph nodes. Thyroid gland, trachea, and esophagus demonstrate no significant findings. Lungs/Pleura: Lungs are clear. No pleural effusion or pneumothorax. Musculoskeletal: No chest wall abnormality. No acute or significant osseous findings. Review of the MIP images confirms the above findings. CT ABDOMEN and PELVIS FINDINGS Hepatobiliary: No gallstones are noted. No biliary dilatation is noted. 7 x 5 cm rounded abnormality is again noted in the left hepatic lobe which is not significantly changed in size compared to prior exam and demonstrates peripheral nodular enhancement, most consistent with hemangioma.  Pancreas: Unremarkable. No pancreatic ductal dilatation or surrounding inflammatory changes. Spleen: Normal in size without focal abnormality. Adrenals/Urinary Tract: Adrenal glands are unremarkable. Kidneys are normal, without renal calculi, focal lesion, or hydronephrosis. Bladder is unremarkable. Stomach/Bowel: Stomach is within normal limits. Appendix appears normal. No evidence of bowel wall thickening, distention, or inflammatory changes. Vascular/Lymphatic: No significant vascular findings are present. No enlarged abdominal or pelvic lymph nodes. Reproductive: Uterus is unremarkable. Occlusion coils are noted in both fallopian tubes. No adnexal abnormality is noted. Other: No abdominal wall hernia or abnormality. No abdominopelvic ascites. Musculoskeletal: No acute or significant osseous findings. Review of the MIP images confirms the above findings. IMPRESSION: 1. No definite evidence of pulmonary embolus. 2. No acute abnormality seen in the chest, abdomen or pelvis. 3. 7 x 5 cm rounded abnormality is again noted in the left hepatic lobe which is not significantly changed in size compared to prior exam and demonstrates peripheral nodular enhancement, most consistent with hemangioma. Electronically Signed   By: Lupita Raider M.D.   On: 02/02/2021 15:56    Procedures Procedures   Medications Ordered in ED Medications  sodium chloride 0.9 % bolus 1,000 mL (0 mLs Intravenous Stopped 02/02/21 1516)  ondansetron (ZOFRAN) injection 4 mg (4 mg Intravenous Given 02/02/21 1254)  morphine 4 MG/ML injection 4 mg (4 mg Intravenous Given 02/02/21 1255)  iohexol (OMNIPAQUE) 350 MG/ML injection 100 mL (100 mLs Intravenous Contrast Given 02/02/21 1522)    ED Course  I have reviewed the triage vital signs and the nursing notes.  Pertinent labs & imaging results that were available during my care of the patient were reviewed by me and considered in my medical decision making (see chart for details).   Patient  here for evaluation of multiple complaints.  Is afebrile, nonseptic, non-ill-appearing.  Has some diffuse thoracic back pain.  Has history of this.  She is unsure if this feels similar.  Patiently has some shortness of breath and pleuritic chest pain however none currently.  No lateral leg swelling, redness or warmth.  No clinical evidence of DVT on exam.  She is not tachycardic, tachypnea or hypoxic.  Does mention some nausea and some vomiting and some chills.  Her abdomen is soft, nontender.  Has history of esophagitis dates this feels similar however is  not been able to follow-up with GI due to lack of health insurance.  We will plan on labs, imaging and reassess  Labs and imaging personally reviewed and interpreted:  CBC without leukocytosis Metabolic panel without electrolyte, renal abnormality Pregnancy test negative Lipase 35 D-dimer 1.04 Troponin<2--<2 CTA chest without any PE, infiltrates, dissection CAT abdomen pelvis with redemonstrated likely hemangioma, no acute process EKG without STEMI  Patient reassessed.  Pain completely resolved here in the emergency department.  At this time I have low suspicion for acute neurosurgical emergency such as cauda equina, discitis, osteomyelitis, transverse myelitis, cardiac etiology such as pneumothorax, ACS, PE, dissection, bacterial infectious process.  No acute abdomen.  She is tolerating p.o. intake here in ED without difficulty.  Likely acute on chronic symptoms.  DC home with symptomatic management at home.  She will return for any worsening symptoms  The patient has been appropriately medically screened and/or stabilized in the ED. I have low suspicion for any other emergent medical condition which would require further screening, evaluation or treatment in the ED or require inpatient management.  Patient is hemodynamically stable and in no acute distress.  Patient able to ambulate in department prior to ED.  Evaluation does not show acute  pathology that would require ongoing or additional emergent interventions while in the emergency department or further inpatient treatment.  I have discussed the diagnosis with the patient and answered all questions.  Pain is been managed while in the emergency department and patient has no further complaints prior to discharge.  Patient is comfortable with plan discussed in room and is stable for discharge at this time.  I have discussed strict return precautions for returning to the emergency department.  Patient was encouraged to follow-up with PCP/specialist refer to at discharge.     MDM Rules/Calculators/A&P                          Lindsay Mccoy was evaluated in Emergency Department on 02/02/2021 for the symptoms described in the history of present illness. She was evaluated in the context of the global COVID-19 pandemic, which necessitated consideration that the patient might be at risk for infection with the SARS-CoV-2 virus that causes COVID-19. Institutional protocols and algorithms that pertain to the evaluation of patients at risk for COVID-19 are in a state of rapid change based on information released by regulatory bodies including the CDC and federal and state organizations. These policies and algorithms were followed during the patient's care in the ED. Final Clinical Impression(s) / ED Diagnoses Final diagnoses:  Acute bilateral thoracic back pain  Epigastric pain  Non-intractable vomiting with nausea, unspecified vomiting type  Cough    Rx / DC Orders ED Discharge Orders         Ordered    methocarbamol (ROBAXIN) 500 MG tablet  2 times daily        02/02/21 1617    lidocaine (LIDODERM) 5 %  Every 24 hours        02/02/21 1617    sucralfate (CARAFATE) 1 g tablet  3 times daily with meals & bedtime        02/02/21 1617    ondansetron (ZOFRAN ODT) 4 MG disintegrating tablet  Every 8 hours PRN        02/02/21 1621           Henderly, Britni A, PA-C 02/02/21 1620     Henderly, Britni A, PA-C 02/02/21 1621  Cheryll Cockayne, MD 02/11/21 [phone]

## 2021-02-02 NOTE — Discharge Instructions (Signed)
Take the medications as prescribed.  Follow-up with primary care provider  Return for any worsening symptoms

## 2021-02-02 NOTE — ED Triage Notes (Addendum)
Pt to er, pt states that yesterday she started having some nausea and vomiting, states that she also has some back pain, states that she had fever and chills last night as well.  States that other people at home are also ill, states that they do not have covid but have a viral infection.  Pt states that when she was last here she was told that she had a liver lesion.  States that she called out of work yesterday and today so she "kinda needs a doctors note"

## 2021-02-24 DIAGNOSIS — G8929 Other chronic pain: Secondary | ICD-10-CM | POA: Insufficient documentation

## 2021-02-24 DIAGNOSIS — J4520 Mild intermittent asthma, uncomplicated: Secondary | ICD-10-CM | POA: Insufficient documentation

## 2021-02-24 DIAGNOSIS — M419 Scoliosis, unspecified: Secondary | ICD-10-CM | POA: Insufficient documentation

## 2021-03-01 ENCOUNTER — Other Ambulatory Visit (HOSPITAL_COMMUNITY)
Admission: RE | Admit: 2021-03-01 | Discharge: 2021-03-01 | Disposition: A | Discharge status: Home / Self Care | Payer: Self-pay | Source: Ambulatory Visit | Attending: Obstetrics & Gynecology | Admitting: Obstetrics & Gynecology

## 2021-03-01 ENCOUNTER — Ambulatory Visit: Payer: Self-pay | Admitting: Obstetrics & Gynecology

## 2021-03-01 ENCOUNTER — Encounter: Payer: Self-pay | Admitting: Obstetrics & Gynecology

## 2021-03-01 ENCOUNTER — Other Ambulatory Visit: Payer: Self-pay

## 2021-03-01 VITALS — BP 129/80 | HR 83 | Ht 65.0 in | Wt 134.2 lb

## 2021-03-01 DIAGNOSIS — N898 Other specified noninflammatory disorders of vagina: Secondary | ICD-10-CM

## 2021-03-01 NOTE — Progress Notes (Signed)
    Patient name: Lindsay Mccoy MRN [id number]  Date of birth: 1986/01/18 Chief Complaint:   Gynecologic Exam  History of Present Illness:   Lindsay Mccoy is a 35 y.o. G37P2002 Caucasian female being seen today for a routine well-woman exam.  Today she notes the following:  Vaginal discharge: mostly white with odor.  Requires pantyliner daily.  Denies vaginal itching.  Denies pelvic or abdominal pain  Contraception: Essure- 2011 Menses monthly usually about 2-3 days.   Patient's last menstrual period was 02/07/2021. Denies issues with her menses  Last pap Nov 2020.  Last mammogram: n/a. Last colonoscopy: n/a  Depression screen [facility] 2/9 03/01/2021  Decreased Interest 0  Down, Depressed, Hopeless 0  PHQ - 2 Score 0  Altered sleeping 0  Tired, decreased energy 0  Change in appetite 0  Feeling bad or failure about yourself  0  Trouble concentrating 0  Moving slowly or fidgety/restless 0  Suicidal thoughts 0  PHQ-9 Score 0    Review of Systems:   Pertinent items are noted in HPI Denies any headaches, blurred vision, fatigue, shortness of breath, chest pain, abdominal pain, bowel movements, urination, or intercourse unless otherwise stated above.  Pertinent History Reviewed:  Reviewed past medical,surgical, social and family history.  Reviewed problem list, medications and allergies. Physical Assessment:   Vitals:   03/01/21 1347  BP: 129/80  Pulse: 83  Weight: 134 lb 3.2 oz (60.9 kg)  Height: 5\' 5"  (1.651 m)  Body mass index is 22.33 kg/m.        Physical Examination:   General appearance - well appearing, and in no distress  Mental status - alert, oriented to person, place, and time  Psych:  She has a normal mood and affect  Skin - warm and dry, normal color, no suspicious lesions noted  Chest - effort normal, all lung fields clear to auscultation bilaterally  Heart - normal rate and regular rhythm  Neck:  midline trachea, no thyromegaly or nodules  Breasts - breasts appear  normal, no suspicious masses, no skin or nipple changes or  axillary nodes  Abdomen - soft, nontender, nondistended, no masses or organomegaly  Pelvic - VULVA: normal appearing vulva with no masses, tenderness or lesions  VAGINA: normal appearing vagina with normal color and discharge, no lesions  CERVIX: normal appearing cervix without discharge or lesions, no CMT  Thin prep pap is not done  UTERUS: uterus is felt to be normal size, shape, consistency and nontender   ADNEXA: No adnexal masses or tenderness noted.  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   Meds: No orders of the defined types were placed in this encounter.   Follow-up: Return in about 4 weeks (around 03/29/2021) for Hereditary cancer discussion/testing.   05/29/2021, DO Attending Obstetrician & Gynecologist, [facility]

## 2021-03-01 NOTE — Addendum Note (Signed)
Addended by: Annamarie Dawley on: 03/01/2021 02:19 PM   Modules accepted: Orders

## 2021-03-03 ENCOUNTER — Other Ambulatory Visit: Payer: Self-pay | Admitting: Obstetrics & Gynecology

## 2021-03-03 DIAGNOSIS — N898 Other specified noninflammatory disorders of vagina: Secondary | ICD-10-CM

## 2021-03-03 LAB — CERVICOVAGINAL ANCILLARY ONLY
Bacterial Vaginitis (gardnerella): POSITIVE — AB
Candida Glabrata: NEGATIVE
Candida Vaginitis: NEGATIVE
Comment: NEGATIVE
Comment: NEGATIVE
Comment: NEGATIVE

## 2021-03-03 MED ORDER — METRONIDAZOLE 500 MG PO TABS: 500.0000 mg | ORAL_TABLET | Freq: Two times a day (BID) | ORAL | 0 refills | Status: DC

## 2021-04-10 NOTE — Progress Notes (Deleted)
   GYN VISIT Patient name: Lindsay Mccoy MRN [id number]  Date of birth: 09/13/1986 Chief Complaint:   No chief complaint on file.  History of Present Illness:   Lindsay Mccoy is a 35 y.o. [id number] who presents to discuss the following:  )Family h/o breast Cancer -reports maternal Aunts with Breast Ca <40yo ***  Depression screen [facility] 2/9 03/01/2021  Decreased Interest 0  Down, Depressed, Hopeless 0  PHQ - 2 Score 0  Altered sleeping 0  Tired, decreased energy 0  Change in appetite 0  Feeling bad or failure about yourself  0  Trouble concentrating 0  Moving slowly or fidgety/restless 0  Suicidal thoughts 0  PHQ-9 Score 0    No LMP recorded. The current method of family planning is {contraceptive method:5051}.  Last pap ***. Results were: {Pap findings:25134} Review of Systems:   Pertinent items are noted in HPI Denies fever/chills, dizziness, headaches, visual disturbances, fatigue, shortness of breath, chest pain, abdominal pain, vomiting, abnormal vaginal discharge/itching/odor/irritation, problems with periods, bowel movements, urination, or intercourse unless otherwise stated above.  Pertinent History Reviewed:  Reviewed past medical,surgical, social, obstetrical and family history.  Reviewed problem list, medications and allergies. Physical Assessment:  There were no vitals filed for this visit.There is no height or weight on file to calculate BMI.       Physical Examination:   General appearance: alert, well appearing, and in no distress  Mental status: alert, oriented to person, place, and time  Skin: warm & dry   Cardiovascular: normal heart rate noted  Respiratory: normal respiratory effort, no distress  Abdomen: soft, non-tender   Pelvic: {pelvic exam:315900::"normal external genitalia, vulva, vagina, cervix, uterus and adnexa"}  Extremities: no edema   Chaperone: {Chaperone:19197::"N/A","Latisha Cresenzo","Janet Young","Amanda Andrews","Peggy Dones","Nicole Jones","Angel  Neas"}    No results found for this or any previous visit (from the past 24 hour(s)).  Assessment & Plan:  1) ***> ***  2) ***> ***  Meds: No orders of the defined types were placed in this encounter.   No orders of the defined types were placed in this encounter.   No follow-ups on file.  Sharon Seller CNM, [facility] 04/10/2021 9:14 PM

## 2021-04-12 ENCOUNTER — Ambulatory Visit: Payer: Self-pay | Admitting: Obstetrics & Gynecology

## 2022-03-03 ENCOUNTER — Encounter: Payer: Self-pay | Admitting: Family Medicine

## 2022-03-03 ENCOUNTER — Ambulatory Visit (INDEPENDENT_AMBULATORY_CARE_PROVIDER_SITE_OTHER): Payer: 59 | Admitting: Family Medicine

## 2022-03-03 VITALS — BP 114/63 | HR 78 | Temp 98.3°F | Resp 20 | Ht 65.0 in | Wt 131.0 lb

## 2022-03-03 DIAGNOSIS — G8929 Other chronic pain: Secondary | ICD-10-CM

## 2022-03-03 DIAGNOSIS — M4103 Infantile idiopathic scoliosis, cervicothoracic region: Secondary | ICD-10-CM

## 2022-03-03 DIAGNOSIS — F909 Attention-deficit hyperactivity disorder, unspecified type: Secondary | ICD-10-CM

## 2022-03-03 DIAGNOSIS — K219 Gastro-esophageal reflux disease without esophagitis: Secondary | ICD-10-CM | POA: Diagnosis not present

## 2022-03-03 DIAGNOSIS — Z7689 Persons encountering health services in other specified circumstances: Secondary | ICD-10-CM

## 2022-03-03 DIAGNOSIS — M549 Dorsalgia, unspecified: Secondary | ICD-10-CM

## 2022-03-03 DIAGNOSIS — J4520 Mild intermittent asthma, uncomplicated: Secondary | ICD-10-CM

## 2022-03-03 MED ORDER — PANTOPRAZOLE SODIUM 40 MG PO TBEC: 40.0000 mg | DELAYED_RELEASE_TABLET | Freq: Every day | ORAL | 3 refills | Status: DC

## 2022-03-03 NOTE — Progress Notes (Signed)
?  ? ?Subjective:  ?Patient ID: Lindsay Mccoy, female    DOB: 11/11/1986, 36 y.o.   MRN: [id number] ? ?Patient Care Team: ?Sonny Masters, FNP as PCP - General (Family Medicine)  ? ?Chief Complaint:  Establish Care ? ? ?HPI: ?Lindsay Mccoy is a 36 y.o. female presenting on 03/03/2022 for Establish Care ? ? ?Patient presents today to establish care with new PCP.  She is originally from [place] and moved to [place] 5 years ago.  She was formally managed by [facility] in [place] which she last saw 02/2021.  She has a history of GERD, intermittent asthma, cervicothoracic scoliosis with chronic back pain, and ADHD.  She states she has been out of her medications for several months.  Has been managing her back pain with Tylenol and Motrin.  She was formally taking gabapentin and Robaxin for pain, but has not had this in several months.  Has been managing her reflux with Tums.  EHR database is incomplete and prior records have been requested.  She does work at [employer] but reports difficulty at times due to her chronic back pain.  She does see GYN and her last PAP was in 2020.  Her biggest concern today is getting referrals to pain management, orthopedics, and psychiatry.  She states her asthma is very well controlled and has not required any inhalers for several years, does not wish to address this today. ? ? ?  03/03/2022  ? 11:10 AM 03/01/2021  ?  1:43 PM  ?Depression screen PHQ 2/9  ?Decreased Interest 0 0  ?Down, Depressed, Hopeless 0 0  ?PHQ - 2 Score 0 0  ?Altered sleeping 3 0  ?Tired, decreased energy 0 0  ?Change in appetite 0 0  ?Feeling bad or failure about yourself  0 0  ?Trouble concentrating 0 0  ?Moving slowly or fidgety/restless 0 0  ?Suicidal thoughts 0 0  ?PHQ-9 Score 3 0  ?Difficult doing work/chores Not difficult at all   ? ? ?  03/03/2022  ? 11:10 AM 03/01/2021  ?  1:43 PM  ?GAD 7 : Generalized Anxiety Score  ?Nervous, Anxious, on Edge 0 0  ?Control/stop worrying 0 0  ?Worry too much -  different things 0 0  ?Trouble relaxing 0 0  ?Restless 1 1  ?Easily annoyed or irritable 0 1  ?Afraid - awful might happen 0 0  ?Total GAD 7 Score 1 2  ?Anxiety Difficulty Not difficult at all   ? ? ? ? ?Relevant past medical, surgical, family, and social history reviewed and updated as indicated.  ?Allergies and medications reviewed and updated. Data reviewed: Chart in Epic. ? ? ?Past Medical History:  ?Diagnosis Date  ? Bronchitis   ? Gastritis   ? Scoliosis   ? ? ?Past Surgical History:  ?Procedure Laterality Date  ? UPPER GI ENDOSCOPY    ? ? ?Social History  ? ?Socioeconomic History  ? Marital status: Single  ?  Spouse name: Not on file  ? Number of children: Not on file  ? Years of education: Not on file  ? Highest education level: Not on file  ?Occupational History  ? Not on file  ?Tobacco Use  ? Smoking status: Former  ?  Packs/day: 0.50  ?  Years: 20.00  ?  Pack years: 10.00  ?  Types: Cigarettes  ?  Quit date: 10/06/2015  ?  Years since quitting: 6.4  ? Smokeless tobacco: Never  ?Vaping Use  ? Vaping Use:  Every day  ?Substance and Sexual Activity  ? Alcohol use: Yes  ?  Comment: occassionally   ? Drug use: Never  ? Sexual activity: Yes  ?  Birth control/protection: Surgical  ?Other Topics Concern  ? Not on file  ?Social History Narrative  ? Not on file  ? ?Social Determinants of Health  ? ?Financial Resource Strain: Not on file  ?Food Insecurity: Not on file  ?Transportation Needs: Not on file  ?Physical Activity: Not on file  ?Stress: Not on file  ?Social Connections: Not on file  ?Intimate Partner Violence: Not on file  ? ? ?Outpatient Encounter Medications as of 03/03/2022  ?Medication Sig  ? pantoprazole (PROTONIX) 40 MG tablet Take 1 tablet (40 mg total) by mouth daily.  ? [DISCONTINUED] ibuprofen (ADVIL) 200 MG tablet Take 200 mg by mouth every 6 (six) hours as needed for fever, headache or mild pain.  ? [DISCONTINUED] lidocaine (LIDODERM) 5 % Place 1 patch onto the skin daily. Remove & Discard patch  within 12 hours or as directed by MD  ? [DISCONTINUED] lisdexamfetamine (VYVANSE) 30 MG capsule Take 30 mg by mouth daily.  ? [DISCONTINUED] methocarbamol (ROBAXIN) 500 MG tablet Take 1 tablet (500 mg total) by mouth 2 (two) times daily.  ? [DISCONTINUED] acetaminophen (TYLENOL) 325 MG tablet Take 325 mg by mouth every 6 (six) hours as needed for mild pain, moderate pain or fever. (Patient not taking: Reported on 03/01/2021)  ? [DISCONTINUED] gabapentin (NEURONTIN) 300 MG capsule Take by mouth.  ? [DISCONTINUED] metroNIDAZOLE (FLAGYL) 500 MG tablet Take 1 tablet (500 mg total) by mouth 2 (two) times daily.  ? [DISCONTINUED] omeprazole (PRILOSEC) 20 MG capsule Take 1 capsule (20 mg total) by mouth 2 (two) times daily before a meal.  ? [DISCONTINUED] ondansetron (ZOFRAN ODT) 4 MG disintegrating tablet Take 1 tablet (4 mg total) by mouth every 8 (eight) hours as needed for nausea or vomiting. (Patient not taking: Reported on 03/01/2021)  ? [DISCONTINUED] sucralfate (CARAFATE) 1 g tablet Take 1 tablet (1 g total) by mouth 4 (four) times daily -  with meals and at bedtime for 10 days.  ? ?No facility-administered encounter medications on file as of 03/03/2022.  ? ? ?No Known Allergies ? ?Review of Systems  ?Constitutional:  Negative for activity change, appetite change, chills, diaphoresis, fatigue, fever and unexpected weight change.  ?HENT: Negative.  Negative for sore throat, trouble swallowing and voice change.   ?Eyes: Negative.   ?Respiratory:  Negative for apnea, cough, choking, chest tightness, shortness of breath, wheezing and stridor.   ?Cardiovascular:  Negative for chest pain, palpitations and leg swelling.  ?Gastrointestinal:  Negative for abdominal pain, blood in stool, constipation, diarrhea, nausea and vomiting.  ?Endocrine: Negative.   ?Genitourinary:  Negative for decreased urine volume, difficulty urinating, dysuria, frequency and urgency.  ?Musculoskeletal:  Positive for arthralgias and back pain.  Negative for gait problem, joint swelling, myalgias, neck pain and neck stiffness.  ?Skin: Negative.   ?Allergic/Immunologic: Negative.   ?Neurological:  Negative for dizziness, tremors, seizures, syncope, facial asymmetry, speech difficulty, weakness, light-headedness, numbness and headaches.  ?Hematological: Negative.   ?Psychiatric/Behavioral:  Positive for sleep disturbance. Negative for confusion, hallucinations and suicidal ideas.   ?All other systems reviewed and are negative. ? ?   ? ?Objective:  ?BP 114/63   Pulse 78   Temp 98.3 ?F (36.8 ?C) (Temporal)   Resp 20   Ht 5\' 5"  (1.651 m)   Wt 131 lb (59.4 kg)   SpO2  100%   BMI 21.80 kg/m?   ? ?Wt Readings from Last 3 Encounters:  ?03/03/22 131 lb (59.4 kg)  ?03/01/21 134 lb 3.2 oz (60.9 kg)  ?02/02/21 123 lb (55.8 kg)  ? ? ?Physical Exam ?Vitals and nursing note reviewed.  ?Constitutional:   ?   General: She is not in acute distress. ?   Appearance: Normal appearance. She is well-developed, well-groomed and normal weight. She is not ill-appearing, toxic-appearing or diaphoretic.  ?   Comments: Appears older than age  ?HENT:  ?   Head: Normocephalic and atraumatic.  ?   Jaw: There is normal jaw occlusion.  ?   Right Ear: Hearing normal.  ?   Left Ear: Hearing normal.  ?   Nose: Nose normal.  ?   Mouth/Throat:  ?   Lips: Pink.  ?   Mouth: Mucous membranes are moist.  ?   Pharynx: Oropharynx is clear. Uvula midline.  ?Eyes:  ?   General: Lids are normal.  ?   Extraocular Movements: Extraocular movements intact.  ?   Conjunctiva/sclera: Conjunctivae normal.  ?   Pupils: Pupils are equal, round, and reactive to light.  ?Neck:  ?   Thyroid: No thyroid mass, thyromegaly or thyroid tenderness.  ?   Vascular: No carotid bruit or JVD.  ?   Trachea: Trachea and phonation normal.  ?Cardiovascular:  ?   Rate and Rhythm: Normal rate and regular rhythm.  ?   Chest Wall: PMI is not displaced.  ?   Pulses: Normal pulses.  ?   Heart sounds: Normal heart sounds. No murmur  heard. ?  No friction rub. No gallop.  ?Pulmonary:  ?   Effort: Pulmonary effort is normal. No respiratory distress.  ?   Breath sounds: Normal breath sounds. No wheezing.  ?Abdominal:  ?   General: Bowel sounds are normal.

## 2022-03-04 LAB — CMP14+EGFR
ALT: 14 IU/L (ref 0–32)
AST: 19 IU/L (ref 0–40)
Albumin/Globulin Ratio: 1.5 (ref 1.2–2.2)
Albumin: 4.3 g/dL (ref 3.8–4.8)
Alkaline Phosphatase: 63 IU/L (ref 44–121)
BUN/Creatinine Ratio: 19 (ref 9–23)
BUN: 15 mg/dL (ref 6–20)
Bilirubin Total: <0.2 mg/dL (ref 0.0–1.2)
CO2: 20 mmol/L (ref 20–29)
Calcium: 9.3 mg/dL (ref 8.7–10.2)
Chloride: 104 mmol/L (ref 96–106)
Creatinine, Ser: 0.78 mg/dL (ref 0.57–1.00)
Globulin, Total: 2.9 g/dL (ref 1.5–4.5)
Glucose: 66 mg/dL — ABNORMAL LOW (ref 70–99)
Potassium: 4.8 mmol/L (ref 3.5–5.2)
Sodium: 139 mmol/L (ref 134–144)
Total Protein: 7.2 g/dL (ref 6.0–8.5)
eGFR: 101 mL/min/{1.73_m2} (ref >59)

## 2022-03-04 LAB — CBC WITH DIFFERENTIAL/PLATELET
Basophils Absolute: 0.1 10*3/uL (ref 0.0–0.2)
Basos: 1 %
EOS (ABSOLUTE): 0.2 10*3/uL (ref 0.0–0.4)
Eos: 3 %
Hematocrit: 36.9 % (ref 34.0–46.6)
Hemoglobin: 11.9 g/dL (ref 11.1–15.9)
Immature Grans (Abs): 0 10*3/uL (ref 0.0–0.1)
Immature Granulocytes: 0 %
Lymphocytes Absolute: 2.4 10*3/uL (ref 0.7–3.1)
Lymphs: 34 %
MCH: 28.3 pg (ref 26.6–33.0)
MCHC: 32.2 g/dL (ref 31.5–35.7)
MCV: 88 fL (ref 79–97)
Monocytes Absolute: 0.5 10*3/uL (ref 0.1–0.9)
Monocytes: 7 %
Neutrophils Absolute: 3.7 10*3/uL (ref 1.4–7.0)
Neutrophils: 55 %
Platelets: 293 10*3/uL (ref 150–450)
RBC: 4.21 x10E6/uL (ref 3.77–5.28)
RDW: 14 % (ref 11.7–15.4)
WBC: 6.9 10*3/uL (ref 3.4–10.8)

## 2022-03-04 LAB — THYROID PANEL WITH TSH
Free Thyroxine Index: 2.1 (ref 1.2–4.9)
T3 Uptake Ratio: 29 % (ref 24–39)
T4, Total: 7.4 ug/dL (ref 4.5–12.0)
TSH: 1.74 u[IU]/mL (ref 0.450–4.500)

## 2022-03-04 LAB — LIPID PANEL
Chol/HDL Ratio: 3.7 ratio (ref 0.0–4.4)
Cholesterol, Total: 177 mg/dL (ref 100–199)
HDL: 48 mg/dL (ref >39)
LDL Chol Calc (NIH): 107 mg/dL — ABNORMAL HIGH (ref 0–99)
Triglycerides: 124 mg/dL (ref 0–149)
VLDL Cholesterol Cal: 22 mg/dL (ref 5–40)

## 2022-03-08 ENCOUNTER — Ambulatory Visit (INDEPENDENT_AMBULATORY_CARE_PROVIDER_SITE_OTHER): Payer: 59 | Admitting: Family Medicine

## 2022-03-08 ENCOUNTER — Encounter: Payer: Self-pay | Admitting: Family Medicine

## 2022-03-08 DIAGNOSIS — J069 Acute upper respiratory infection, unspecified: Secondary | ICD-10-CM

## 2022-03-08 MED ORDER — AMOXICILLIN-POT CLAVULANATE 875-125 MG PO TABS: 1.0000 | ORAL_TABLET | Freq: Two times a day (BID) | ORAL | 0 refills | Status: AC

## 2022-03-08 NOTE — Progress Notes (Signed)
? ?Virtual Visit via telephone Note ?Due to COVID-19 pandemic this visit was conducted virtually. This visit type was conducted due to national recommendations for restrictions regarding the COVID-19 Pandemic (e.g. social distancing, sheltering in place) in an effort to limit this patient's exposure and mitigate transmission in our community. All issues noted in this document were discussed and addressed.  A physical exam was not performed with this format.  ? ?I connected with Lindsay Mccoy on 03/08/2022 at 1145 by telephone and verified that I am speaking with the correct person using two identifiers. Lindsay Mccoy is currently located at home and family is currently with them during visit. The provider, Kari Baars, FNP is located in their office at time of visit. ? ?I discussed the limitations, risks, security and privacy concerns of performing an evaluation and management service by virtual visit and the availability of in person appointments. I also discussed with the patient that there may be a patient responsible charge related to this service. The patient expressed understanding and agreed to proceed. ? ?Subjective:  ?Patient ID: Lindsay Mccoy, female    DOB: Oct 01, 1986, 36 y.o.   MRN: [id number] ? ?Chief Complaint:  URI ? ? ?HPI: ?Lindsay Mccoy is a 36 y.o. female presenting on 03/08/2022 for URI ? ? ?Pt reports ongoing cough, congestion, rhinorrhea, and chills for over 7 days. She has been taking OTC cold and allergy medications without relief of symptoms.  ? ?URI  ?This is a new problem. The current episode started 1 to 4 weeks ago. The problem has been gradually worsening. Associated symptoms include congestion, coughing, headaches, rhinorrhea and a sore throat. Pertinent negatives include no abdominal pain, chest pain, diarrhea, dysuria, ear pain, joint pain, joint swelling, nausea, neck pain, plugged ear sensation, rash, sinus pain, sneezing, swollen glands, vomiting or wheezing. She has tried decongestant for the  symptoms. The treatment provided no relief.  ? ? ?Relevant past medical, surgical, family, and social history reviewed and updated as indicated.  ?Allergies and medications reviewed and updated. ? ? ?Past Medical History:  ?Diagnosis Date  ? Bronchitis   ? Gastritis   ? Scoliosis   ? ? ?Past Surgical History:  ?Procedure Laterality Date  ? UPPER GI ENDOSCOPY    ? ? ?Social History  ? ?Socioeconomic History  ? Marital status: Single  ?  Spouse name: Not on file  ? Number of children: Not on file  ? Years of education: Not on file  ? Highest education level: Not on file  ?Occupational History  ? Not on file  ?Tobacco Use  ? Smoking status: Former  ?  Packs/day: 0.50  ?  Years: 20.00  ?  Pack years: 10.00  ?  Types: Cigarettes  ?  Quit date: 10/06/2015  ?  Years since quitting: 6.4  ? Smokeless tobacco: Never  ?Vaping Use  ? Vaping Use: Every day  ?Substance and Sexual Activity  ? Alcohol use: Yes  ?  Comment: occassionally   ? Drug use: Never  ? Sexual activity: Yes  ?  Birth control/protection: Surgical  ?Other Topics Concern  ? Not on file  ?Social History Narrative  ? Not on file  ? ?Social Determinants of Health  ? ?Financial Resource Strain: Not on file  ?Food Insecurity: Not on file  ?Transportation Needs: Not on file  ?Physical Activity: Not on file  ?Stress: Not on file  ?Social Connections: Not on file  ?Intimate Partner Violence: Not on file  ? ? ?Outpatient Encounter Medications as  of 03/08/2022  ?Medication Sig  ? amoxicillin-clavulanate (AUGMENTIN) 875-125 MG tablet Take 1 tablet by mouth 2 (two) times daily for 10 days.  ? pantoprazole (PROTONIX) 40 MG tablet Take 1 tablet (40 mg total) by mouth daily.  ? [DISCONTINUED] omeprazole (PRILOSEC) 20 MG capsule Take 1 capsule (20 mg total) by mouth 2 (two) times daily before a meal.  ? ?No facility-administered encounter medications on file as of 03/08/2022.  ? ? ?No Known Allergies ? ?Review of Systems  ?Constitutional:  Positive for activity change and chills.  Negative for appetite change, diaphoresis, fatigue, fever and unexpected weight change.  ?HENT:  Positive for congestion, postnasal drip, rhinorrhea and sore throat. Negative for ear pain, sinus pressure, sinus pain, sneezing and tinnitus.   ?Eyes: Negative.   ?Respiratory:  Positive for cough. Negative for chest tightness, shortness of breath and wheezing.   ?Cardiovascular:  Negative for chest pain, palpitations and leg swelling.  ?Gastrointestinal:  Negative for abdominal pain, blood in stool, constipation, diarrhea, nausea and vomiting.  ?Endocrine: Negative.   ?Genitourinary:  Negative for decreased urine volume, difficulty urinating, dysuria, frequency and urgency.  ?Musculoskeletal:  Negative for arthralgias, joint pain, myalgias and neck pain.  ?Skin: Negative.  Negative for rash.  ?Allergic/Immunologic: Negative.   ?Neurological:  Positive for headaches. Negative for dizziness and weakness.  ?Hematological: Negative.   ?Psychiatric/Behavioral:  Negative for confusion, hallucinations, sleep disturbance and suicidal ideas.   ?All other systems reviewed and are negative. ? ?   ? ? ?Observations/Objective: ?No vital signs or physical exam, this was a virtual health encounter.  ?Pt alert and oriented, answers all questions appropriately, and able to speak in full sentences.  ? ? ?Assessment and Plan: ?Lindsay Mccoy was seen today for uri. ? ?Diagnoses and all orders for this visit: ? ?URI with cough and congestion ?Ongoing and worsening symptoms despite symptomatic care at home. Pt aware to continue symptomatic care and add Augmentin as prescribed to regimen. Report any new, worsening, or persistent symptoms.  ?-     amoxicillin-clavulanate (AUGMENTIN) 875-125 MG tablet; Take 1 tablet by mouth 2 (two) times daily for 10 days. ? ? ? ? ?Follow Up Instructions: ?Return if symptoms worsen or fail to improve. ? ?  ?I discussed the assessment and treatment plan with the patient. The patient was provided an opportunity to ask  questions and all were answered. The patient agreed with the plan and demonstrated an understanding of the instructions. ?  ?The patient was advised to call back or seek an in-person evaluation if the symptoms worsen or if the condition fails to improve as anticipated. ? ?The above assessment and management plan was discussed with the patient. The patient verbalized understanding of and has agreed to the management plan. Patient is aware to call the clinic if they develop any new symptoms or if symptoms persist or worsen. Patient is aware when to return to the clinic for a follow-up visit. Patient educated on when it is appropriate to go to the emergency department.  ? ? ?I provided 12 minutes of time during this telephone encounter. ? ? ?Kari Baars, FNP-C ?[facility] ?[address] ?[place], [place] [address] ?([phone] ?03/08/2022 ? ? ?

## 2022-03-10 ENCOUNTER — Encounter: Payer: Self-pay | Admitting: Family

## 2022-03-10 ENCOUNTER — Ambulatory Visit (INDEPENDENT_AMBULATORY_CARE_PROVIDER_SITE_OTHER): Payer: 59 | Admitting: Family

## 2022-03-10 ENCOUNTER — Encounter: Payer: Self-pay | Admitting: Physical Medicine & Rehabilitation

## 2022-03-10 DIAGNOSIS — J029 Acute pharyngitis, unspecified: Secondary | ICD-10-CM

## 2022-03-10 DIAGNOSIS — J069 Acute upper respiratory infection, unspecified: Secondary | ICD-10-CM | POA: Diagnosis not present

## 2022-03-10 LAB — RAPID STREP SCREEN (MED CTR MEBANE ONLY): Strep Gp A Ag, IA W/Reflex: NEGATIVE

## 2022-03-10 LAB — VERITOR FLU A/B WAIVED
Influenza A: NEGATIVE
Influenza B: NEGATIVE

## 2022-03-10 LAB — CULTURE, GROUP A STREP

## 2022-03-10 NOTE — Progress Notes (Signed)
? ?Virtual Visit  Note ?Due to COVID-19 pandemic this visit was conducted virtually. This visit type was conducted due to national recommendations for restrictions regarding the COVID-19 Pandemic (e.g. social distancing, sheltering in place) in an effort to limit this patient's exposure and mitigate transmission in our community. All issues noted in this document were discussed and addressed.  A physical exam was not performed with this format. ? ?I connected with Lindsay Mccoy on 03/10/22 at 1:16 pm by telephone and verified that I am speaking with the correct person using two identifiers. Lindsay Mccoy is currently located at home  and no one is currently with her during visit. The provider, Lindsay Rodney, FNP is located in their office at time of visit. ? ?I discussed the limitations, risks, security and privacy concerns of performing an evaluation and management service by telephone and the availability of in person appointments. I also discussed with the patient that there may be a patient responsible charge related to this service. The patient expressed understanding and agreed to proceed. ? ? ?Lindsay Mccoy,you are scheduled for a virtual visit with your provider today.   ? ?Just as we do with appointments in the office, we must obtain your consent to participate.  Your consent will be active for this visit and any virtual visit you may have with one of our providers in the next 365 days.   ? ?If you have a MyChart account, I can also send a copy of this consent to you electronically.  All virtual visits are billed to your insurance company just like a traditional visit in the office.  As this is a virtual visit, video technology does not allow for your provider to perform a traditional examination.  This may limit your provider's ability to fully assess your condition.  If your provider identifies any concerns that need to be evaluated in person or the need to arrange testing such as labs, EKG, etc, we will make  arrangements to do so.   ? ?Although advances in technology are sophisticated, we cannot ensure that it will always work on either your end or our end.  If the connection with a video visit is poor, we may have to switch to a telephone visit.  With either a video or telephone visit, we are not always able to ensure that we have a secure connection.   I need to obtain your verbal consent now.   Are you willing to proceed with your visit today?  ? ?Lindsay Mccoy has provided verbal consent on 03/10/2022 for a virtual visit (video or telephone). ? ? ?Lindsay Rodney, FNP ?03/10/2022  1:16 PM ? ? ?History and Present Illness: ? ?PT calls the office today with cough, chills, congestion, sore throat, and intermittent diarrhea for over a week. She had a virtual appointment on 03/08/20 and was given Augmentin. States her symptoms have not improved and have worsen.  Her work is requiring her a COVID and Flu test before returning.  ?Sore Throat  ?This is a new problem. The current episode started 1 to 4 weeks ago. The problem has been gradually worsening. There has been no fever. Associated symptoms include congestion, coughing, diarrhea, a hoarse voice and trouble swallowing. Pertinent negatives include no ear discharge, ear pain, headaches, shortness of breath or swollen glands. She has tried acetaminophen for the symptoms. The treatment provided mild relief.  ? ? ? ?Review of Systems  ?HENT:  Positive for congestion, hoarse voice and trouble swallowing. Negative for ear discharge  and ear pain.   ?Respiratory:  Positive for cough. Negative for shortness of breath.   ?Gastrointestinal:  Positive for diarrhea.  ?Neurological:  Negative for headaches.  ? ? ?Observations/Objective: ?No SOB or distress noted, hoarse voice ? ?Assessment and Plan: ?1. URI with cough and congestion ?- Take meds as prescribed ?- Use a cool mist humidifier  ?-Use saline nose sprays frequently ?-Force fluids ?-For any cough or congestion ? Use plain Mucinex-  regular strength or max strength is fine ?-For fever or aces or pains- take tylenol or ibuprofen. ?-Throat lozenges if help ?-New toothbrush in 3 days ?Continue Augmentin ?Work note given ?- Veritor Flu A/B Waived ?- Novel Coronavirus, NAA ([facility]) ?- Rapid Strep Screen ([facility] ONLY) ? ?2. Sore throat ?- Veritor Flu A/B Waived ?- Novel Coronavirus, NAA ([facility]) ?- Rapid Strep Screen ([facility] ONLY) ? ? ?  ?I discussed the assessment and treatment plan with the patient. The patient was provided an opportunity to ask questions and all were answered. The patient agreed with the plan and demonstrated an understanding of the instructions. ?  ?The patient was advised to call back or seek an in-person evaluation if the symptoms worsen or if the condition fails to improve as anticipated. ? ?The above assessment and management plan was discussed with the patient. The patient verbalized understanding of and has agreed to the management plan. Patient is aware to call the clinic if symptoms persist or worsen. Patient is aware when to return to the clinic for a follow-up visit. Patient educated on when it is appropriate to go to the emergency department.  ? ?Time call ended:  1:28 pm  ? ?I provided 12 minutes of  non face-to-face time during this encounter. ? ? ? ?Lindsay Rodney, FNP ? ? ?

## 2022-03-11 LAB — NOVEL CORONAVIRUS, NAA: SARS-CoV-2, NAA: NOT DETECTED

## 2022-05-01 ENCOUNTER — Encounter: Payer: 59 | Attending: Physical Medicine & Rehabilitation | Admitting: Physical Medicine & Rehabilitation

## 2022-05-19 ENCOUNTER — Encounter: Payer: Self-pay | Admitting: *Deleted

## 2022-06-02 ENCOUNTER — Ambulatory Visit: Payer: 59 | Admitting: Family Medicine

## 2022-06-05 ENCOUNTER — Encounter: Payer: 59 | Admitting: Physical Medicine & Rehabilitation

## 2022-06-07 ENCOUNTER — Encounter: Payer: Self-pay | Admitting: Family Medicine

## 2022-06-07 ENCOUNTER — Ambulatory Visit: Payer: 59 | Admitting: Family Medicine

## 2022-08-19 ENCOUNTER — Other Ambulatory Visit: Payer: Self-pay | Admitting: Family Medicine

## 2022-08-19 DIAGNOSIS — K219 Gastro-esophageal reflux disease without esophagitis: Secondary | ICD-10-CM

## 2022-08-21 ENCOUNTER — Other Ambulatory Visit: Payer: Self-pay | Admitting: Family Medicine

## 2022-08-21 DIAGNOSIS — K219 Gastro-esophageal reflux disease without esophagitis: Secondary | ICD-10-CM

## 2022-10-14 IMAGING — CT CT ANGIO CHEST
2 of 6 series · 18 of 36 positions shown · IV contrast (Omnipaque or Isovue)
Comparison: August 21, 2019.

CLINICAL DATA: Acute generalized abdominal pain, shortness of
breath.

EXAM:
CT ANGIOGRAPHY CHEST
CT ABDOMEN AND PELVIS WITH CONTRAST
TECHNIQUE: Multidetector CT imaging of the chest was performed using the
standard protocol during bolus administration of intravenous
contrast. Multiplanar CT image reconstructions and MIPs were
obtained to evaluate the vascular anatomy. Multidetector CT imaging
of the abdomen and pelvis was performed using the standard protocol
during bolus administration of intravenous contrast.
CONTRAST:  100mL OMNIPAQUE IOHEXOL 350 MG/ML SOLN

[Series 6: pe axial thins · axial · 0.80mm/px · z∈[-352,-41]mm · 17 of 430 slices shown]
[im 21/430  lung]
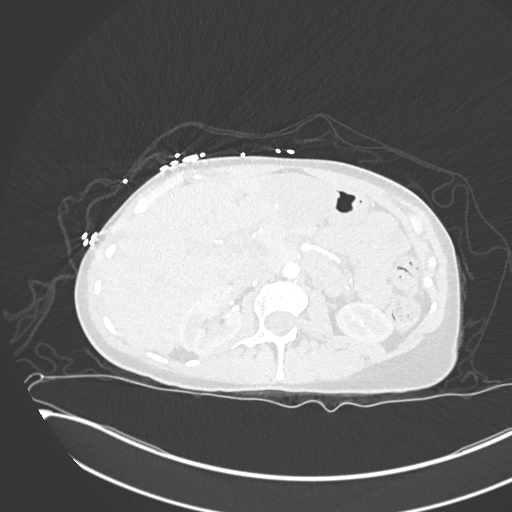
[im 41/430  mediastinal]
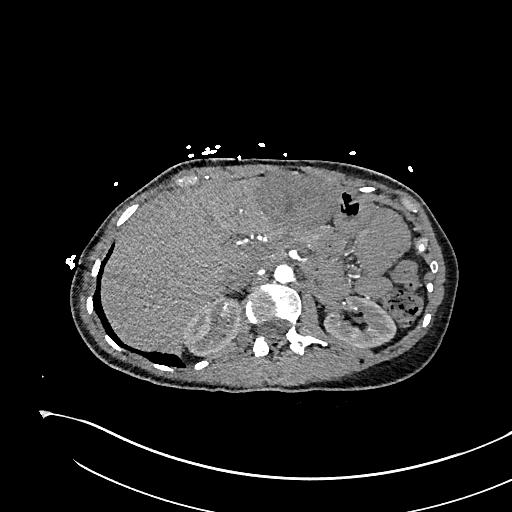
[im 62/430  lung]
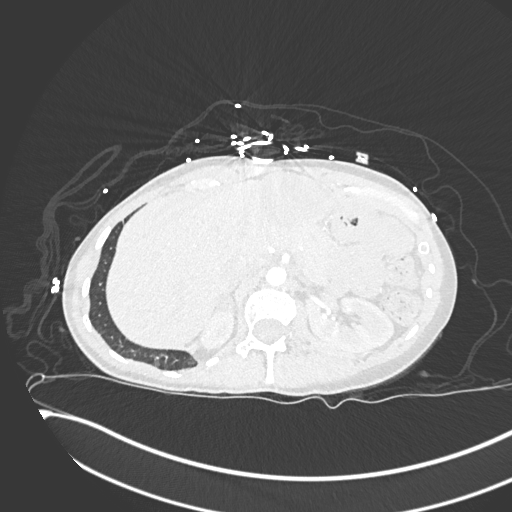
[im 103/430  mediastinal]
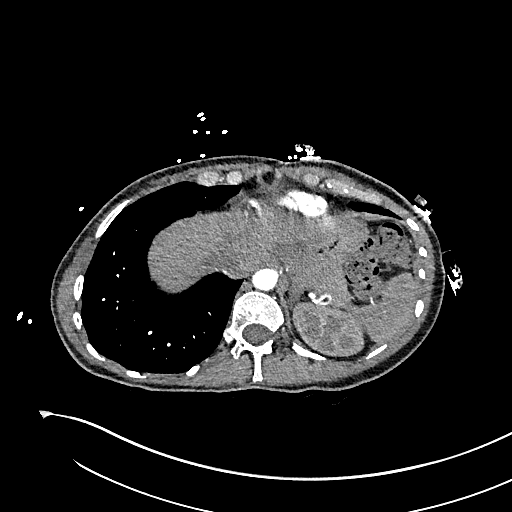
[im 123/430  lung]
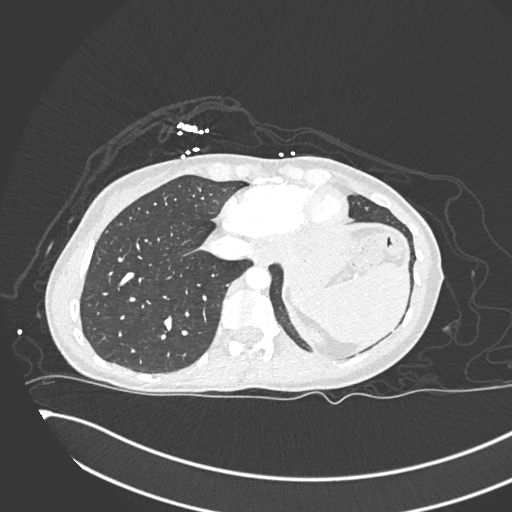
[im 144/430  mediastinal]
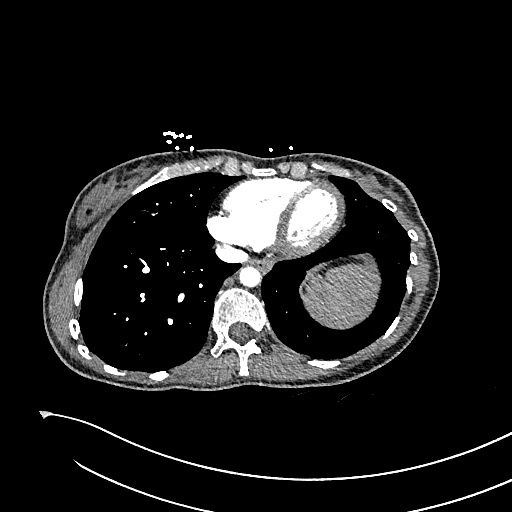
[im 164/430  lung]
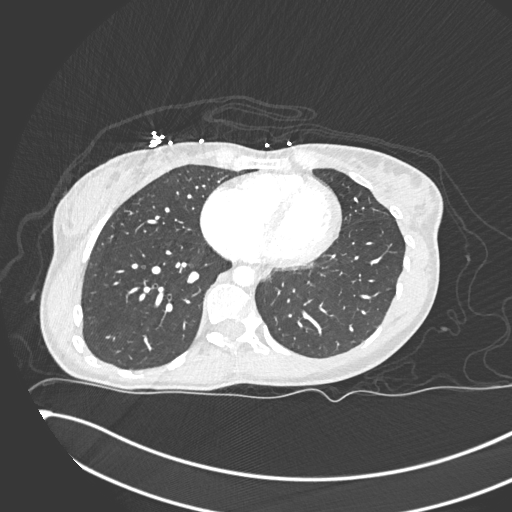
[im 184/430  mediastinal]
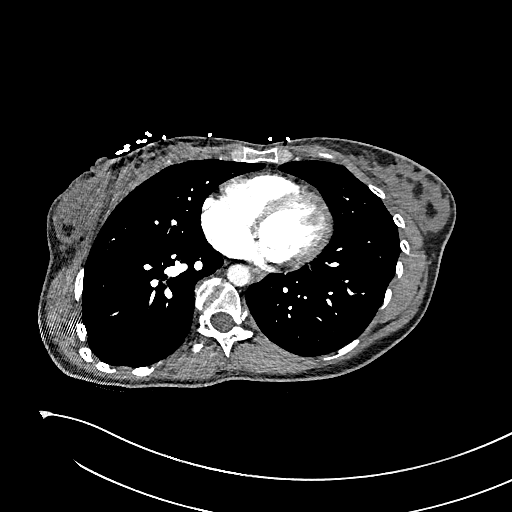
[im 225/430  lung]
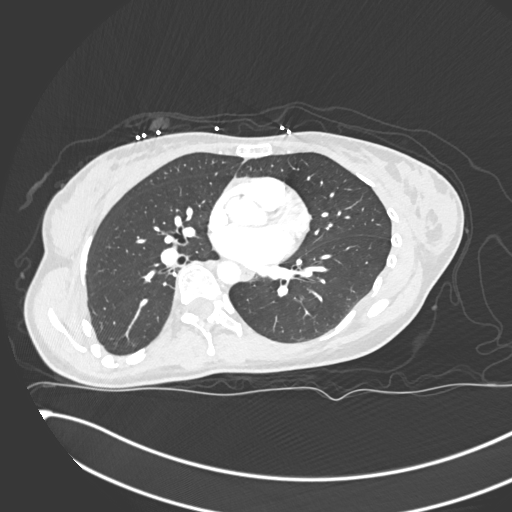
[im 246/430  mediastinal]
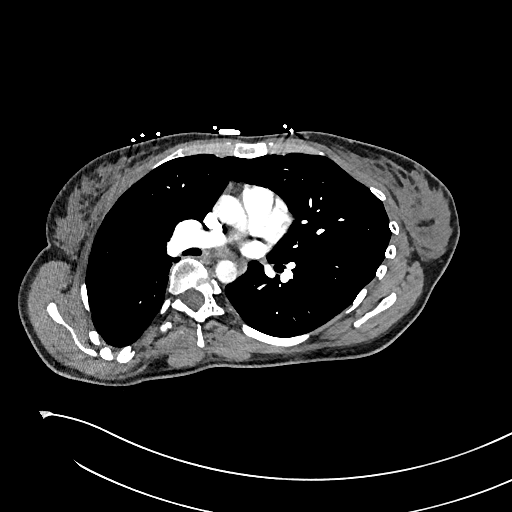
[im 266/430  lung]
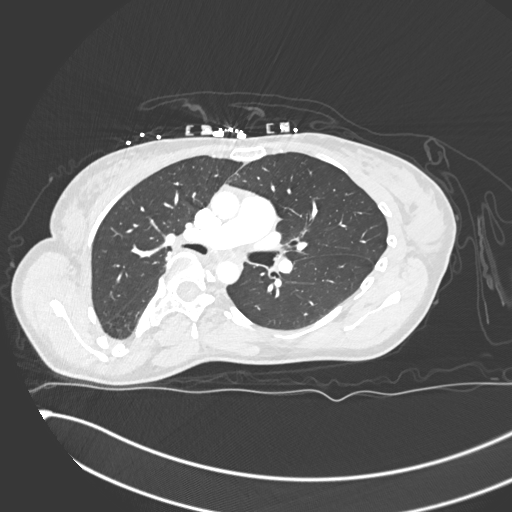
[im 287/430  mediastinal]
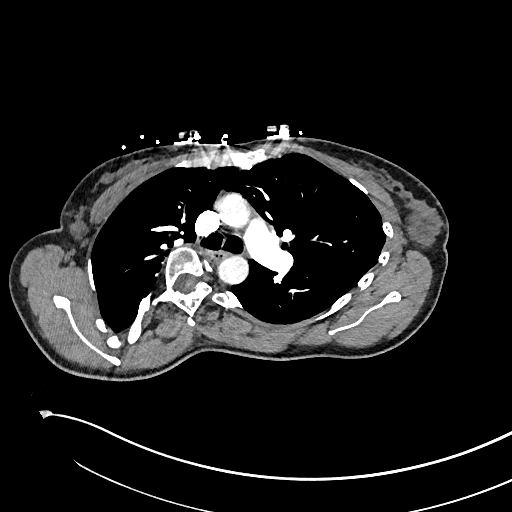
[im 307/430  lung]
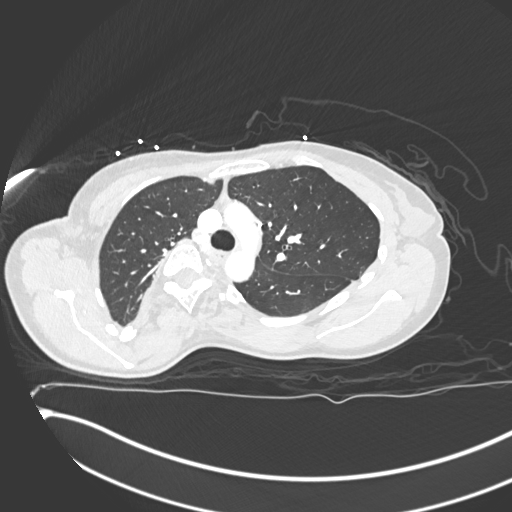
[im 327/430  mediastinal]
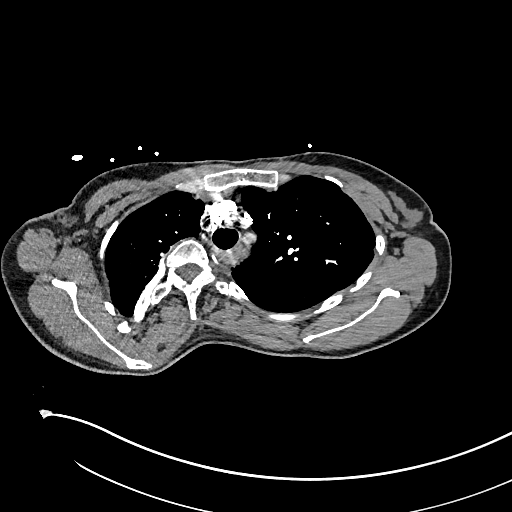
[im 368/430  lung]
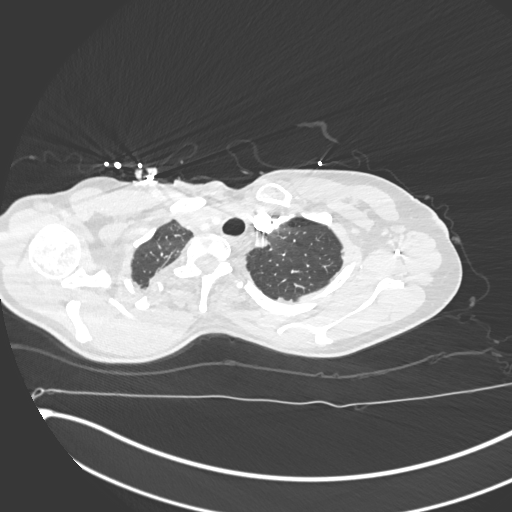
[im 389/430  mediastinal]
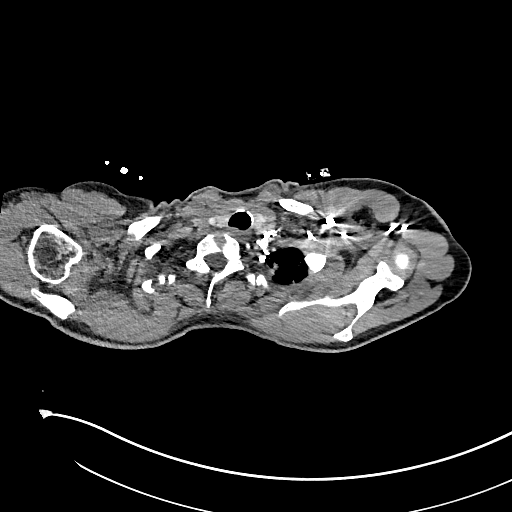
[im 409/430  lung]
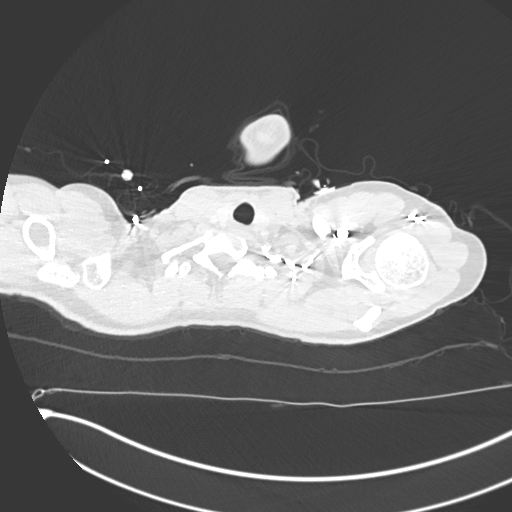

[Series 8: cor soft · coronal · 0.68mm/px · 1 of 118 slices shown]
[im 59/118  mediastinal]
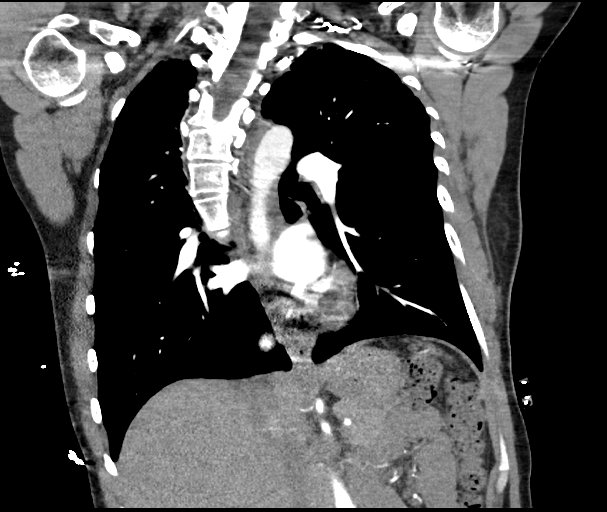

[18 of 36 positions shown; findings below may reference images not displayed]

FINDINGS: CTA CHEST FINDINGS

Cardiovascular: Satisfactory opacification of the pulmonary arteries
to the segmental level. No evidence of pulmonary embolism. Normal
heart size. No pericardial effusion.

Mediastinum/Nodes: No enlarged mediastinal, hilar, or axillary lymph
nodes. Thyroid gland, trachea, and esophagus demonstrate no
significant findings.

Lungs/Pleura: Lungs are clear. No pleural effusion or pneumothorax.

Musculoskeletal: No chest wall abnormality. No acute or significant
osseous findings.

Review of the MIP images confirms the above findings.

CT ABDOMEN and PELVIS FINDINGS

Hepatobiliary: No gallstones are noted. No biliary dilatation is
noted. 7 x 5 cm rounded abnormality is again noted in the left
hepatic lobe which is not significantly changed in size compared to
prior exam and demonstrates peripheral nodular enhancement, most
consistent with hemangioma.

Pancreas: Unremarkable. No pancreatic ductal dilatation or
surrounding inflammatory changes.

Spleen: Normal in size without focal abnormality.

Adrenals/Urinary Tract: Adrenal glands are unremarkable. Kidneys are
normal, without renal calculi, focal lesion, or hydronephrosis.
Bladder is unremarkable.

Stomach/Bowel: Stomach is within normal limits. Appendix appears
normal. No evidence of bowel wall thickening, distention, or
inflammatory changes.

Vascular/Lymphatic: No significant vascular findings are present. No
enlarged abdominal or pelvic lymph nodes.

Reproductive: Uterus is unremarkable. Occlusion coils are noted in
both fallopian tubes. No adnexal abnormality is noted.

Other: No abdominal wall hernia or abnormality. No abdominopelvic
ascites.

Musculoskeletal: No acute or significant osseous findings.

Review of the MIP images confirms the above findings.
IMPRESSION: 1. No definite evidence of pulmonary embolus.
2. No acute abnormality seen in the chest, abdomen or pelvis.
[DATE] x 5 cm rounded abnormality is again noted in the left hepatic
lobe which is not significantly changed in size compared to prior
exam and demonstrates peripheral nodular enhancement, most
consistent with hemangioma.

## 2022-10-23 ENCOUNTER — Encounter: Payer: Self-pay | Admitting: Family Medicine

## 2022-10-23 ENCOUNTER — Other Ambulatory Visit: Payer: Self-pay | Admitting: Family Medicine

## 2022-10-23 DIAGNOSIS — K219 Gastro-esophageal reflux disease without esophagitis: Secondary | ICD-10-CM

## 2022-10-23 NOTE — Telephone Encounter (Signed)
LMTCB TO SCHEDULE APPT LETTER MAILED 

## 2022-10-23 NOTE — Telephone Encounter (Signed)
Rakes NTBS 30 days given 08/21/22

## 2022-12-16 DIAGNOSIS — K219 Gastro-esophageal reflux disease without esophagitis: Secondary | ICD-10-CM | POA: Diagnosis not present

## 2022-12-16 DIAGNOSIS — Z79899 Other long term (current) drug therapy: Secondary | ICD-10-CM | POA: Diagnosis not present

## 2022-12-16 DIAGNOSIS — M25562 Pain in left knee: Secondary | ICD-10-CM | POA: Diagnosis not present

## 2022-12-16 DIAGNOSIS — K2900 Acute gastritis without bleeding: Secondary | ICD-10-CM | POA: Diagnosis not present

## 2022-12-16 DIAGNOSIS — M549 Dorsalgia, unspecified: Secondary | ICD-10-CM | POA: Diagnosis not present

## 2022-12-16 DIAGNOSIS — M419 Scoliosis, unspecified: Secondary | ICD-10-CM | POA: Diagnosis not present

## 2022-12-16 DIAGNOSIS — R109 Unspecified abdominal pain: Secondary | ICD-10-CM | POA: Diagnosis not present

## 2022-12-16 DIAGNOSIS — Z889 Allergy status to unspecified drugs, medicaments and biological substances status: Secondary | ICD-10-CM | POA: Diagnosis not present

## 2022-12-16 DIAGNOSIS — G8929 Other chronic pain: Secondary | ICD-10-CM | POA: Diagnosis not present

## 2022-12-26 ENCOUNTER — Telehealth: Payer: Self-pay

## 2022-12-26 NOTE — Telephone Encounter (Signed)
Transition Care Management Follow-up Telephone Call Date of discharge and from where: [name] 12/24/2022 How have you been since you were released from the hospital? Still having chest pain Any questions or concerns? No  Items Reviewed: Did the pt receive and understand the discharge instructions provided? Yes  Medications obtained and verified? Yes  Other? No  Any new allergies since your discharge? No  Dietary orders reviewed? Yes Do you have support at home? Yes   Home Care and Equipment/Supplies: Were home health services ordered? no If so, what is the name of the agency? N/a  Has the agency set up a time to come to the patient's home? no Were any new equipment or medical supplies ordered?  No What is the name of the medical supply agency? N/a Were you able to get the supplies/equipment? no Do you have any questions related to the use of the equipment or supplies? No  Functional Questionnaire: (I = Independent and D = Dependent) ADLs: I  Bathing/Dressing- I  Meal Prep- I  Eating- I  Maintaining continence- I  Transferring/Ambulation- I  Managing Meds- I  Follow up appointments reviewed:  PCP Hospital f/u appt confirmed? Yes  Scheduled to see Lindsay Mccoy on 12/27/2022 @ 10:05. Lindsay Mccoy Hospital f/u appt confirmed? No   Are transportation arrangements needed? No  If their condition worsens, is the pt aware to call PCP or go to the Emergency Dept.? Yes Was the patient provided with contact information for the PCP's office or ED? Yes Was to pt encouraged to call back with questions or concerns? Yes  Lindsay Crumble, LPN [facility] Direct Dial [phone]

## 2022-12-27 ENCOUNTER — Ambulatory Visit: Payer: Managed Care, Other (non HMO) | Admitting: Family Medicine

## 2022-12-27 ENCOUNTER — Encounter: Payer: Self-pay | Admitting: Family Medicine

## 2022-12-27 VITALS — BP 116/73 | HR 90 | Temp 98.3°F | Ht 65.0 in | Wt 131.2 lb

## 2022-12-27 DIAGNOSIS — R079 Chest pain, unspecified: Secondary | ICD-10-CM

## 2022-12-27 DIAGNOSIS — B001 Herpesviral vesicular dermatitis: Secondary | ICD-10-CM

## 2022-12-27 DIAGNOSIS — K219 Gastro-esophageal reflux disease without esophagitis: Secondary | ICD-10-CM

## 2022-12-27 MED ORDER — VALACYCLOVIR HCL 1 G PO TABS: 1000.0000 mg | ORAL_TABLET | Freq: Every day | ORAL | 0 refills | Status: DC

## 2022-12-27 MED ORDER — VALACYCLOVIR HCL 1 G PO TABS: 1000.0000 mg | ORAL_TABLET | Freq: Every day | ORAL | 0 refills | Status: AC

## 2022-12-27 MED ORDER — PANTOPRAZOLE SODIUM 40 MG PO TBEC: 40.0000 mg | DELAYED_RELEASE_TABLET | Freq: Every day | ORAL | 6 refills | Status: DC

## 2022-12-27 NOTE — Progress Notes (Signed)
Subjective:  Patient ID: Lindsay Mccoy, female    DOB: 1986-10-12, 37 y.o.   MRN: [id number]  Patient Care Team: Baruch Gouty, FNP as PCP - General (Family Medicine)   Chief Complaint:  ER follow up  [facility] Emergency Department 12/24/22- chest pain)   HPI: Lindsay Mccoy is a 37 y.o. female presenting on 12/27/2022 for ER follow up  [facility] 12/24/22- chest pain)   Pt was seen in ED at [facility] 12/24/2022 for chest pain. ACS workup was negative. She was discharged home and told to follow up with PCP for referral to cardiology. She states the pain has been intermittent for years. Nothing seems to improve or worsen the pain. No associated nausea, vomiting, diaphoresis, color change, shortness of breath, palpitations, dizziness, or syncope.   She reports she also needs refills on her valtrex and protonix. States medications are both working well with no associated side effects.   Chest Pain  This is a recurrent problem. The current episode started more than 1 month ago. The problem occurs intermittently. The problem has been resolved. The pain is present in the substernal region and epigastric region. The pain is moderate. The quality of the pain is described as pressure, heavy, burning, tightness and squeezing. The pain does not radiate. Pertinent negatives include no abdominal pain, back pain, claudication, cough, diaphoresis, dizziness, exertional chest pressure, fever, headaches, hemoptysis, irregular heartbeat, leg pain, lower extremity edema, malaise/fatigue, nausea, near-syncope, numbness, orthopnea, palpitations, PND, shortness of breath, sputum production, syncope, vomiting or weakness. The pain is aggravated by nothing. She has tried nothing for the symptoms.  Her past medical history is significant for anxiety/panic attacks.  Pertinent negatives for past medical history include no seizures.  Her family medical history is significant for CAD and heart disease. Prior diagnostic workup  includes chest x-ray.     Relevant past medical, surgical, family, and social history reviewed and updated as indicated.  Allergies and medications reviewed and updated. Data reviewed: Chart in Epic.   Past Medical History:  Diagnosis Date   Bronchitis    Gastritis    Scoliosis     Past Surgical History:  Procedure Laterality Date   UPPER GI ENDOSCOPY      Social History   Socioeconomic History   Marital status: Single    Spouse name: Not on file   Number of children: Not on file   Years of education: Not on file   Highest education level: Not on file  Occupational History   Not on file  Tobacco Use   Smoking status: Former    Packs/day: 0.50    Years: 20.00    Total pack years: 10.00    Types: Cigarettes    Quit date: 10/06/2015    Years since quitting: 7.2   Smokeless tobacco: Never  Vaping Use   Vaping Use: Every day  Substance and Sexual Activity   Alcohol use: Yes    Comment: occassionally    Drug use: Never   Sexual activity: Yes    Birth control/protection: Surgical  Other Topics Concern   Not on file  Social History Narrative   Not on file   Social Determinants of Health   Financial Resource Strain: Low Risk  (03/01/2021)   Overall Financial Resource Strain (CARDIA)    Difficulty of Paying Living Expenses: Not hard at all  Food Insecurity: [place] Present (03/01/2021)   Hunger Vital Sign    Worried About [place] in the  Last Year: Sometimes true    Ran Out of Food in the Last Year: Sometimes true  Transportation Needs: No Transportation Needs (03/01/2021)   PRAPARE - Administrator, Civil Service (Medical): No    Lack of Transportation (Non-Medical): No  Physical Activity: Sufficiently Active (03/01/2021)   Exercise Vital Sign    Days of Exercise per Week: 5 days    Minutes of Exercise per Session: 100 min  Stress: No Stress Concern Present (03/01/2021)   [employer] of Occupational Health - Occupational  Stress Questionnaire    Feeling of Stress : Not at all  Social Connections: Moderately Isolated (03/01/2021)   Social Connection and Isolation Panel [NHANES]    Frequency of Communication with Friends and Family: Once a week    Frequency of Social Gatherings with Friends and Family: Once a week    Attends Religious Services: 1 to 4 times per year    Active Member of [employer] or Organizations: No    Attends Banker Meetings: Never    Marital Status: Living with partner  Intimate Partner Violence: Not At Risk (03/01/2021)   Humiliation, Afraid, Rape, and Kick questionnaire    Fear of Current or Ex-Partner: No    Emotionally Abused: No    Physically Abused: No    Sexually Abused: No    Outpatient Encounter Medications as of 12/27/2022  Medication Sig   [DISCONTINUED] clindamycin (CLEOCIN) 300 MG capsule Take 300 mg by mouth 4 (four) times daily.   [DISCONTINUED] valACYclovir (VALTREX) 1000 MG tablet Take 1,000 mg by mouth 2 (two) times daily.   pantoprazole (PROTONIX) 40 MG tablet Take 1 tablet (40 mg total) by mouth daily. (NEEDS TO BE SEEN BEFORE NEXT REFILL)   valACYclovir (VALTREX) 1000 MG tablet Take 1 tablet (1,000 mg total) by mouth daily.   [DISCONTINUED] omeprazole (PRILOSEC) 20 MG capsule Take 1 capsule (20 mg total) by mouth 2 (two) times daily before a meal.   [DISCONTINUED] pantoprazole (PROTONIX) 40 MG tablet Take 1 tablet (40 mg total) by mouth daily. (NEEDS TO BE SEEN BEFORE NEXT REFILL) (Patient not taking: Reported on 12/27/2022)   [DISCONTINUED] pantoprazole (PROTONIX) 40 MG tablet Take 1 tablet (40 mg total) by mouth daily. (NEEDS TO BE SEEN BEFORE NEXT REFILL)   [DISCONTINUED] valACYclovir (VALTREX) 1000 MG tablet Take 1 tablet (1,000 mg total) by mouth daily.   No facility-administered encounter medications on file as of 12/27/2022.    No Known Allergies  Review of Systems  Constitutional:  Negative for activity change, appetite change, chills, diaphoresis,  fatigue, fever, malaise/fatigue and unexpected weight change.  HENT: Negative.  Negative for sore throat, trouble swallowing and voice change.   Eyes: Negative.  Negative for photophobia and visual disturbance.  Respiratory:  Negative for apnea, cough, hemoptysis, sputum production, choking, chest tightness, shortness of breath, wheezing and stridor.   Cardiovascular:  Positive for chest pain. Negative for palpitations, orthopnea, claudication, leg swelling, syncope, PND and near-syncope.  Gastrointestinal:  Negative for abdominal distention, abdominal pain, anal bleeding, blood in stool, constipation, diarrhea, nausea, rectal pain and vomiting.  Endocrine: Negative.   Genitourinary:  Negative for decreased urine volume, difficulty urinating, dysuria, frequency and urgency.  Musculoskeletal:  Negative for arthralgias, back pain and myalgias.  Skin: Negative.   Allergic/Immunologic: Negative.   Neurological:  Negative for dizziness, tremors, seizures, syncope, facial asymmetry, speech difficulty, weakness, light-headedness, numbness and headaches.  Hematological: Negative.   Psychiatric/Behavioral:  Negative for confusion, hallucinations, sleep disturbance and suicidal ideas.  All other systems reviewed and are negative.       Objective:  BP 116/73   Pulse 90   Temp 98.3 F (36.8 C) (Temporal)   Ht 5\' 5"  (1.651 m)   Wt 131 lb 3.2 oz (59.5 kg)   LMP 12/13/2022   SpO2 100%   BMI 21.83 kg/m    Wt Readings from Last 3 Encounters:  12/27/22 131 lb 3.2 oz (59.5 kg)  03/03/22 131 lb (59.4 kg)  03/01/21 134 lb 3.2 oz (60.9 kg)    Physical Exam Vitals and nursing note reviewed.  Constitutional:      General: She is not in acute distress.    Appearance: Normal appearance. She is well-developed, well-groomed and normal weight. She is not ill-appearing, toxic-appearing or diaphoretic.  HENT:     Head: Normocephalic and atraumatic.     Jaw: There is normal jaw occlusion.     Right  Ear: Hearing normal.     Left Ear: Hearing normal.     Nose: Nose normal.     Mouth/Throat:     Lips: Pink.     Mouth: Mucous membranes are moist.     Pharynx: Uvula midline.  Eyes:     General: Lids are normal.     Pupils: Pupils are equal, round, and reactive to light.  Neck:     Thyroid: No thyroid mass, thyromegaly or thyroid tenderness.     Vascular: No carotid bruit or JVD.     Trachea: Trachea and phonation normal.  Cardiovascular:     Rate and Rhythm: Normal rate and regular rhythm.     Chest Wall: PMI is not displaced.     Pulses: Normal pulses.     Heart sounds: Normal heart sounds. No murmur heard.    No friction rub. No gallop.  Pulmonary:     Effort: Pulmonary effort is normal. No respiratory distress.     Breath sounds: Normal breath sounds. No wheezing.  Abdominal:     General: There is no abdominal bruit.     Palpations: There is no hepatomegaly or splenomegaly.  Musculoskeletal:     Cervical back: Normal range of motion and neck supple.     Right lower leg: No edema.     Left lower leg: No edema.     Comments: Significant scoliosis   Lymphadenopathy:     Cervical: No cervical adenopathy.  Skin:    General: Skin is warm and dry.     Capillary Refill: Capillary refill takes less than 2 seconds.     Coloration: Skin is not cyanotic, jaundiced or pale.     Findings: No rash.  Neurological:     General: No focal deficit present.     Mental Status: She is alert and oriented to person, place, and time.     Sensory: Sensation is intact.     Motor: Motor function is intact.     Coordination: Coordination is intact.     Gait: Gait is intact.     Deep Tendon Reflexes: Reflexes are normal and symmetric.  Psychiatric:        Attention and Perception: Attention and perception normal.        Mood and Affect: Mood and affect normal.        Speech: Speech normal.        Behavior: Behavior normal. Behavior is cooperative.        Thought Content: Thought content  normal.        Cognition and  Memory: Cognition and memory normal.        Judgment: Judgment normal.     Results for orders placed or performed in visit on 03/10/22  Novel Coronavirus, NAA ([facility])   Specimen: Nasopharyngeal(NP) swabs in vial transport medium  Result Value Ref Range   SARS-CoV-2, NAA Not Detected Not Detected  Rapid Strep Screen ([facility] ONLY)   Specimen: Other   Other  Result Value Ref Range   Strep Gp A Ag, IA W/Reflex Negative Negative  Culture, Group A Strep   Other  Result Value Ref Range   Strep A Culture CANCELED   Veritor Flu A/B Waived  Result Value Ref Range   Influenza A Negative Negative   Influenza B Negative Negative       Pertinent labs & imaging results that were available during my care of the patient were reviewed by me and considered in my medical decision making.  Assessment & Plan:  Lindsay Mccoy was seen today for er follow up .  Diagnoses and all orders for this visit:  Chest pain in adult ED workup negative for ACS. No indications of ACS. Referral to cardiology placed. Pt aware of red flags which require emergent evaluation and treatment.  -     Ambulatory referral to Cardiology  GERD without esophagitis Pt reports she needs a refill on medications today. Will refill. No red flags concerning for esophagitis. Will discuss weaning off PPI therapy at next visit.  -     pantoprazole (PROTONIX) 40 MG tablet; Take 1 tablet (40 mg total) by mouth daily. 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 Aware of proper dosing.  -     valACYclovir (VALTREX) 1000 MG tablet; Take 1 tablet (1,000 mg total) by mouth daily.     Continue all other maintenance medications.  Follow up plan: Return if symptoms worsen or fail to improve.   Continue healthy lifestyle choices, including diet (rich in fruits, vegetables, and lean proteins, and low in salt and simple  carbohydrates) and exercise (at least 30 minutes of moderate physical activity daily).  Educational handout given for nonspecific chest pain  The above assessment and management plan was discussed with the patient. The patient verbalized understanding of and has agreed to the management plan. Patient is aware to call the clinic if they develop any new symptoms or if symptoms persist or worsen. Patient is aware when to return to the clinic for a follow-up visit. Patient educated on when it is appropriate to go to the emergency department.   Monia Pouch, FNP-C [facility] [phone]

## 2023-01-02 ENCOUNTER — Encounter: Payer: Self-pay | Admitting: *Deleted

## 2023-01-03 ENCOUNTER — Ambulatory Visit: Payer: Managed Care, Other (non HMO) | Attending: Internal Medicine | Admitting: Internal Medicine

## 2023-01-03 ENCOUNTER — Encounter: Payer: Self-pay | Admitting: Internal Medicine

## 2023-01-03 ENCOUNTER — Encounter: Payer: Self-pay | Admitting: *Deleted

## 2023-01-03 VITALS — BP 114/76 | HR 84 | Ht 65.0 in | Wt 125.2 lb

## 2023-01-03 DIAGNOSIS — R079 Chest pain, unspecified: Secondary | ICD-10-CM | POA: Diagnosis not present

## 2023-01-03 NOTE — Progress Notes (Signed)
Cardiology Office Note  Date: 01/03/2023   ID: Lindsay Mccoy, DOB 1986-07-02, MRN [id number]  PCP:  Baruch Gouty, FNP  Cardiologist:  Chalmers Guest, MD Electrophysiologist:  None   Reason for Office Visit: Evaluation of chest pain at the request of Rakes, FNP   History of Present Illness: Lindsay Mccoy is a 37 y.o. female known to have anxiety was referred to cardiology clinic for evaluation of chest pain.  Patient has been having substernal chest pain with exertion lasting for a few minutes to 10 minutes and resolving with breathing technique/rest. She is under a lot of stress right now due to her husband now becoming ex-husband. She said things have gotten a lot better recently but still a lot more to go. She has severe anxiety and cannot function due to ongoing stress.  Has palpitations.  Denies any SOB, dizziness and syncope.  No family history of premature ASCVD.   Past Medical History:  Diagnosis Date   Bronchitis    Gastritis    Scoliosis     Past Surgical History:  Procedure Laterality Date   UPPER GI ENDOSCOPY      Current Outpatient Medications  Medication Sig Dispense Refill   hydrOXYzine (VISTARIL) 50 MG capsule Take by mouth.     pantoprazole (PROTONIX) 40 MG tablet Take 1 tablet (40 mg total) by mouth daily. (NEEDS TO BE SEEN BEFORE NEXT REFILL) 30 tablet 6   valACYclovir (VALTREX) 1000 MG tablet Take 1 tablet (1,000 mg total) by mouth daily. 90 tablet 0   No current facility-administered medications for this visit.   Allergies:  Patient has no known allergies.   Social History: The patient  reports that she quit smoking about 7 years ago. Her smoking use included cigarettes. She has a 10.00 pack-year smoking history. She has never used smokeless tobacco. She reports current alcohol use. She reports that she does not use drugs.   Family History: The patient's family history includes Asthma in her mother; Scoliosis in her mother.   ROS:  Please see the  history of present illness. Otherwise, complete review of systems is positive for none.  All other systems are reviewed and negative.   Physical Exam: VS:  LMP 12/13/2022 , BMI There is no height or weight on file to calculate BMI.  Wt Readings from Last 3 Encounters:  12/27/22 131 lb 3.2 oz (59.5 kg)  03/03/22 131 lb (59.4 kg)  03/01/21 134 lb 3.2 oz (60.9 kg)    General: Patient appears comfortable at rest. HEENT: Conjunctiva and lids normal, oropharynx clear with moist mucosa. Neck: Supple, no elevated JVP or carotid bruits, no thyromegaly. Lungs: Clear to auscultation, nonlabored breathing at rest. Cardiac: Regular rate and rhythm, no S3 or significant systolic murmur, no pericardial rub. Abdomen: Soft, nontender, no hepatomegaly, bowel sounds present, no guarding or rebound. Extremities: No pitting edema, distal pulses 2+. Skin: Warm and dry. Musculoskeletal: No kyphosis. Neuropsychiatric: Alert and oriented x3, affect grossly appropriate.  ECG:  An ECG dated 01/03/2023 was personally reviewed today and demonstrated:  NSR  Recent Labwork: 03/03/2022: ALT 14; AST 19; BUN 15; Creatinine, Ser 0.78; Hemoglobin 11.9; Platelets 293; Potassium 4.8; Sodium 139; TSH 1.740     Component Value Date/Time   CHOL 177 03/03/2022 1127   TRIG 124 03/03/2022 1127   HDL 48 03/03/2022 1127   CHOLHDL 3.7 03/03/2022 1127   LDLCALC 107 (H) 03/03/2022 1127    Other Studies Reviewed Today:   Assessment and Plan: Patient  is a 37 y/o F known to have anxiety was referred to cardiology clinic for evaluation of chest pain.  # Chest pain likely sec to stress/anxiety -Obtain treadmill exercise EKG   I have spent a total of 30 minutes with patient reviewing chart, EKGs, labs and examining patient as well as establishing an assessment and plan that was discussed with the patient.  > 50% of time was spent in direct patient care.     Medication Adjustments/Labs and Tests Ordered: Current medicines  are reviewed at length with the patient today.  Concerns regarding medicines are outlined above.   Tests Ordered: Orders Placed This Encounter  Procedures   EKG 12-Lead    Medication Changes: No orders of the defined types were placed in this encounter.   Disposition:  Follow up prn  Signed Kaitlynn Tramontana Fidel Levy, MD, 01/03/2023 1:06 PM    [facility] [place], [place], [place] [address]

## 2023-01-03 NOTE — Patient Instructions (Addendum)
Medication Instructions:  Your physician recommends that you continue on your current medications as directed. Please refer to the Current Medication list given to you today.  Labwork: none  Testing/Procedures: Your physician has requested that you have an exercise tolerance test. For further information please visit [email or web address]. Please also follow instruction sheet, as given.  Follow-Up: Your physician recommends that you schedule a follow-up appointment in: as needed  Any Other Special Instructions Will Be Listed Below (If Applicable).  If you need a refill on your cardiac medications before your next appointment, please call your pharmacy.

## 2023-01-05 ENCOUNTER — Ambulatory Visit (HOSPITAL_COMMUNITY)
Admission: RE | Admit: 2023-01-05 | Discharge: 2023-01-05 | Disposition: A | Discharge status: Home / Self Care | Payer: Managed Care, Other (non HMO) | Source: Ambulatory Visit | Attending: Internal Medicine | Admitting: Internal Medicine

## 2023-01-05 DIAGNOSIS — R079 Chest pain, unspecified: Secondary | ICD-10-CM | POA: Insufficient documentation

## 2023-01-05 LAB — EXERCISE TOLERANCE TEST
Angina Index: 1
Duke Treadmill Score: 6
Estimated workload: 13.4
Exercise duration (min): 10 min
Exercise duration (sec): 0 s
MPHR: 183 {beats}/min
Peak HR: 173 {beats}/min
Percent HR: 94 %
RPE: 14
Rest HR: 67 {beats}/min
ST Depression (mm): 0 mm

## 2023-01-08 ENCOUNTER — Telehealth: Payer: Self-pay | Admitting: Internal Medicine

## 2023-01-08 NOTE — Telephone Encounter (Signed)
  Laurine Blazer, LPN [id number]  [id number] PM EST Back to Top    Notified, copy to pcp.   Vishnu P Mallipeddi, MD 01/05/2023  1:41 PM EST     Normal exercise stress ECG.

## 2023-01-08 NOTE — Telephone Encounter (Signed)
Pt is calling in regards to results. Requesting return call.

## 2023-02-17 ENCOUNTER — Encounter (HOSPITAL_COMMUNITY): Payer: Self-pay | Admitting: *Deleted

## 2023-02-17 ENCOUNTER — Other Ambulatory Visit: Payer: Self-pay

## 2023-02-17 ENCOUNTER — Emergency Department (HOSPITAL_COMMUNITY): Payer: 59

## 2023-02-17 ENCOUNTER — Emergency Department (HOSPITAL_COMMUNITY)
Admission: EM | Admit: 2023-02-17 | Discharge: 2023-02-17 | Disposition: A | Discharge status: Home / Self Care | Payer: 59 | Attending: Emergency Medicine | Admitting: Emergency Medicine

## 2023-02-17 DIAGNOSIS — S060X9A Concussion with loss of consciousness of unspecified duration, initial encounter: Secondary | ICD-10-CM | POA: Insufficient documentation

## 2023-02-17 DIAGNOSIS — W19XXXA Unspecified fall, initial encounter: Secondary | ICD-10-CM | POA: Diagnosis not present

## 2023-02-17 LAB — BASIC METABOLIC PANEL
Anion gap: 8 (ref 5–15)
BUN: 19 mg/dL (ref 6–20)
CO2: 23 mmol/L (ref 22–32)
Calcium: 8.5 mg/dL — ABNORMAL LOW (ref 8.9–10.3)
Chloride: 106 mmol/L (ref 98–111)
Creatinine, Ser: 0.7 mg/dL (ref 0.44–1.00)
GFR, Estimated: >60 mL/min (ref >60)
Glucose, Bld: 84 mg/dL (ref 70–99)
Potassium: 3.3 mmol/L — ABNORMAL LOW (ref 3.5–5.1)
Sodium: 137 mmol/L (ref 135–145)

## 2023-02-17 LAB — CBC WITH DIFFERENTIAL/PLATELET
Abs Immature Granulocytes: 0.02 10*3/uL (ref 0.00–0.07)
Basophils Absolute: 0 10*3/uL (ref 0.0–0.1)
Basophils Relative: 0 %
Eosinophils Absolute: 0 10*3/uL (ref 0.0–0.5)
Eosinophils Relative: 0 %
HCT: 34.1 % — ABNORMAL LOW (ref 36.0–46.0)
Hemoglobin: 10.7 g/dL — ABNORMAL LOW (ref 12.0–15.0)
Immature Granulocytes: 0 %
Lymphocytes Relative: 24 %
Lymphs Abs: 2.3 10*3/uL (ref 0.7–4.0)
MCH: 27.4 pg (ref 26.0–34.0)
MCHC: 31.4 g/dL (ref 30.0–36.0)
MCV: 87.2 fL (ref 80.0–100.0)
Monocytes Absolute: 0.7 10*3/uL (ref 0.1–1.0)
Monocytes Relative: 8 %
Neutro Abs: 6.3 10*3/uL (ref 1.7–7.7)
Neutrophils Relative %: 68 %
Platelets: 318 10*3/uL (ref 150–400)
RBC: 3.91 MIL/uL (ref 3.87–5.11)
RDW: 13.2 % (ref 11.5–15.5)
WBC: 9.4 10*3/uL (ref 4.0–10.5)
nRBC: 0 % (ref 0.0–0.2)

## 2023-02-17 LAB — HCG, SERUM, QUALITATIVE: Preg, Serum: NEGATIVE

## 2023-02-17 NOTE — ED Notes (Signed)
Patient transported to CT 

## 2023-02-17 NOTE — ED Triage Notes (Signed)
Pt states she passed out for few seconds, states she hit her head on bathroom door.  Pt states she had emesis on and off last night and this morning. Pt states she is "an emotional person"  and can cause her to have emesis. Denies any emesis since passing out.
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
## 2023-02-17 NOTE — Discharge Instructions (Signed)
Drink plenty of fluids, follow-up with your primary as your hemoglobin slightly low today which is sign anemia.  This may just be a one-time event but if it becomes chronic you may need further evaluation.  Return to the ED for new or concerning symptoms.

## 2023-05-02 ENCOUNTER — Encounter: Payer: Self-pay | Admitting: Family Medicine

## 2023-05-02 ENCOUNTER — Telehealth: Payer: Self-pay | Admitting: Family Medicine

## 2023-05-02 ENCOUNTER — Ambulatory Visit: Payer: Managed Care, Other (non HMO) | Admitting: Family Medicine

## 2023-05-02 VITALS — BP 109/73 | HR 72 | Temp 98.1°F | Ht 65.0 in | Wt 131.2 lb

## 2023-05-02 DIAGNOSIS — D649 Anemia, unspecified: Secondary | ICD-10-CM | POA: Diagnosis not present

## 2023-05-02 DIAGNOSIS — F339 Major depressive disorder, recurrent, unspecified: Secondary | ICD-10-CM | POA: Insufficient documentation

## 2023-05-02 DIAGNOSIS — M549 Dorsalgia, unspecified: Secondary | ICD-10-CM

## 2023-05-02 DIAGNOSIS — F909 Attention-deficit hyperactivity disorder, unspecified type: Secondary | ICD-10-CM

## 2023-05-02 DIAGNOSIS — G8929 Other chronic pain: Secondary | ICD-10-CM

## 2023-05-02 DIAGNOSIS — K219 Gastro-esophageal reflux disease without esophagitis: Secondary | ICD-10-CM | POA: Diagnosis not present

## 2023-05-02 DIAGNOSIS — E876 Hypokalemia: Secondary | ICD-10-CM

## 2023-05-02 DIAGNOSIS — M545 Low back pain, unspecified: Secondary | ICD-10-CM

## 2023-05-02 DIAGNOSIS — D500 Iron deficiency anemia secondary to blood loss (chronic): Secondary | ICD-10-CM

## 2023-05-02 DIAGNOSIS — E538 Deficiency of other specified B group vitamins: Secondary | ICD-10-CM

## 2023-05-02 DIAGNOSIS — N6459 Other signs and symptoms in breast: Secondary | ICD-10-CM | POA: Diagnosis not present

## 2023-05-02 MED ORDER — PANTOPRAZOLE SODIUM 40 MG PO TBEC: 40.0000 mg | DELAYED_RELEASE_TABLET | Freq: Every day | ORAL | 1 refills | Status: DC

## 2023-05-02 NOTE — Progress Notes (Signed)
Subjective:  Patient ID: Lindsay Mccoy, female    DOB: Apr 15, 1986, 37 y.o.   MRN: [id number]  Patient Care Team: Sonny Masters, FNP as PCP - General (Family Medicine) Mallipeddi, Orion Modest, MD as PCP - Cardiology (Cardiology)   Chief Complaint:  Breast Mass (Patient has noticed she has lumps in bilateral breast that has been on and off for years. ) and Depression (Patient states that her anxiety and depression has been ongoing but now is getting worse.  )   HPI: Lindsay Mccoy is a 37 y.o. female presenting on 05/02/2023 for Breast Mass (Patient has noticed she has lumps in bilateral breast that has been on and off for years. ) and Depression (Patient states that her anxiety and depression has been ongoing but now is getting worse.  )    1. GERD without esophagitis Compliant with medications - Yes Current medications - protonix Adverse side effects - No Cough - No Sore throat - No Voice change - No Hemoptysis - No Dysphagia or dyspepsia - No Water brash - No Red Flags (weight loss, hematochezia, melena, weight loss, early satiety, fevers, odynophagia, or persistent vomiting) - No  2. Hypokalemia Was noted to be low during ED visit in 01/2023. Not on repletion therapy. Has not followed up to have labs rechecked.   3. Hemoglobin low Was noted to be low during ED visit in 01/2023. Not on repletion therapy. Has not followed up to have labs rechecked.   4. Abnormal breast tissue Reports bilateral breast masses with tenderness over the last 4-5 months. She does report a family history of breast cancer. No weight changes or night sweats reported. No lymphadenopathy reported.   5. Adult ADHD 6. Depression, recurrent (HCC) Has not followed up with psychiatry as referred. Feels her ADHD and depression are worsening. Denies SI or HI.     05/02/2023   10:43 AM 12/27/2022   10:04 AM 03/03/2022   11:10 AM 03/01/2021    1:43 PM  GAD 7 : Generalized Anxiety Score  Nervous, Anxious, on Edge 1 1 0 0   Control/stop worrying 3 2 0 0  Worry too much - different things 3 2 0 0  Trouble relaxing 2 1 0 0  Restless 2 1 1 1   Easily annoyed or irritable 3 1 0 1  Afraid - awful might happen 1 0 0 0  Total GAD 7 Score 15 8 1 2   Anxiety Difficulty Very difficult Somewhat difficult Not difficult at all        05/02/2023   10:43 AM 12/27/2022   10:03 AM 03/03/2022   11:10 AM 03/01/2021    1:43 PM  Depression screen PHQ 2/9  Decreased Interest 1 1 0 0  Down, Depressed, Hopeless 3 2 0 0  PHQ - 2 Score 4 3 0 0  Altered sleeping 1 3 3  0  Tired, decreased energy 1 2 0 0  Change in appetite 0 2 0 0  Feeling bad or failure about yourself  0 1 0 0  Trouble concentrating 0 0 0 0  Moving slowly or fidgety/restless 0 1 0 0  Suicidal thoughts 0 0 0 0  PHQ-9 Score 6 12 3  0  Difficult doing work/chores Somewhat difficult Somewhat difficult Not difficult at all      7. Chronic high back pain Has been referred to pain management but has not followed up, would like new referral.      Relevant past medical, surgical, family, and  social history reviewed and updated as indicated.  Allergies and medications reviewed and updated. Data reviewed: Chart in Epic.   Past Medical History:  Diagnosis Date   Bronchitis    Gastritis    Scoliosis     Past Surgical History:  Procedure Laterality Date   UPPER GI ENDOSCOPY      Social History   Socioeconomic History   Marital status: Married    Spouse name: Not on file   Number of children: Not on file   Years of education: Not on file   Highest education level: Not on file  Occupational History   Not on file  Tobacco Use   Smoking status: Former    Packs/day: 0.50    Years: 20.00    Additional pack years: 0.00    Total pack years: 10.00    Types: Cigarettes    Quit date: 10/06/2015    Years since quitting: 7.5   Smokeless tobacco: Never  Vaping Use   Vaping Use: Every day  Substance and Sexual Activity   Alcohol use: Yes    Comment:  occassionally    Drug use: Never   Sexual activity: Yes    Birth control/protection: Surgical  Other Topics Concern   Not on file  Social History Narrative   Not on file   Social Determinants of Health   Financial Resource Strain: Low Risk  (03/01/2021)   Overall Financial Resource Strain (CARDIA)    Difficulty of Paying Living Expenses: Not hard at all  Food Insecurity: Food Insecurity Present (03/01/2021)   Hunger Vital Sign    Worried About Running Out of Food in the Last Year: Sometimes true    Ran Out of Food in the Last Year: Sometimes true  Transportation Needs: No Transportation Needs (03/01/2021)   PRAPARE - Administrator, Civil Service (Medical): No    Lack of Transportation (Non-Medical): No  Physical Activity: Sufficiently Active (03/01/2021)   Exercise Vital Sign    Days of Exercise per Week: 5 days    Minutes of Exercise per Session: 100 min  Stress: No Stress Concern Present (03/01/2021)   [employer] of Occupational Health - Occupational Stress Questionnaire    Feeling of Stress : Not at all  Social Connections: Moderately Isolated (03/01/2021)   Social Connection and Isolation Panel [NHANES]    Frequency of Communication with Friends and Family: Once a week    Frequency of Social Gatherings with Friends and Family: Once a week    Attends Religious Services: 1 to 4 times per year    Active Member of [employer] or Organizations: No    Attends Banker Meetings: Never    Marital Status: Living with partner  Intimate Partner Violence: Not At Risk (03/01/2021)   Humiliation, Afraid, Rape, and Kick questionnaire    Fear of Current or Ex-Partner: No    Emotionally Abused: No    Physically Abused: No    Sexually Abused: No    Outpatient Encounter Medications as of 05/02/2023  Medication Sig   meloxicam (MOBIC) 7.5 MG tablet Take 7.5 mg by mouth daily.   [DISCONTINUED] pantoprazole (PROTONIX) 40 MG tablet Take 1 tablet (40 mg total) by mouth  daily. (NEEDS TO BE SEEN BEFORE NEXT REFILL)   gabapentin (NEURONTIN) 300 MG capsule Take 300 mg by mouth as needed. (Patient not taking: Reported on 05/02/2023)   pantoprazole (PROTONIX) 40 MG tablet Take 1 tablet (40 mg total) by mouth daily.   [DISCONTINUED] omeprazole (PRILOSEC)  20 MG capsule Take 1 capsule (20 mg total) by mouth 2 (two) times daily before a meal.   No facility-administered encounter medications on file as of 05/02/2023.    No Known Allergies  Review of Systems  Constitutional:  Positive for activity change, appetite change and fatigue. Negative for chills, diaphoresis, fever and unexpected weight change.  HENT: Negative.    Eyes: Negative.  Negative for photophobia and visual disturbance.  Respiratory:  Negative for cough, chest tightness and shortness of breath.   Cardiovascular:  Negative for chest pain, palpitations and leg swelling.  Gastrointestinal:  Negative for abdominal pain, blood in stool, constipation, diarrhea, nausea and vomiting.  Endocrine: Negative.  Negative for polydipsia, polyphagia and polyuria.  Genitourinary:  Negative for decreased urine volume, difficulty urinating, dysuria, frequency and urgency.  Musculoskeletal:  Positive for arthralgias, back pain, gait problem and myalgias.  Allergic/Immunologic: Negative.   Neurological:  Negative for dizziness, tremors, seizures, syncope, facial asymmetry, speech difficulty, weakness, light-headedness, numbness and headaches.  Hematological: Negative.   Psychiatric/Behavioral:  Positive for agitation and sleep disturbance. Negative for behavioral problems, confusion, decreased concentration, dysphoric mood, hallucinations, self-injury and suicidal ideas. The patient is nervous/anxious. The patient is not hyperactive.   All other systems reviewed and are negative.       Objective:  BP 109/73   Pulse 72   Temp 98.1 F (36.7 C) (Temporal)   Ht 5\' 5"  (1.651 m)   Wt 131 lb 3.2 oz (59.5 kg)   LMP  04/11/2023   SpO2 99%   BMI 21.83 kg/m    Wt Readings from Last 3 Encounters:  05/02/23 131 lb 3.2 oz (59.5 kg)  02/17/23 121 lb (54.9 kg)  01/03/23 125 lb 3.2 oz (56.8 kg)    Physical Exam Vitals and nursing note reviewed.  Constitutional:      General: She is not in acute distress.    Appearance: She is not ill-appearing, toxic-appearing or diaphoretic.     Comments: Appears older than stated age  HENT:     Head: Normocephalic and atraumatic.  Eyes:     Conjunctiva/sclera: Conjunctivae normal.     Pupils: Pupils are equal, round, and reactive to light.  Cardiovascular:     Rate and Rhythm: Normal rate and regular rhythm.     Heart sounds: Normal heart sounds.  Pulmonary:     Effort: Pulmonary effort is normal.     Breath sounds: Normal breath sounds.  Chest:     Chest wall: Tenderness present.  Breasts:    Breasts are symmetrical.     Right: Mass present.     Left: Mass present.    Musculoskeletal:     Comments: Significant scoliosis of upper spine  Lymphadenopathy:     Upper Body:     Right upper body: No supraclavicular, axillary or pectoral adenopathy.     Left upper body: No supraclavicular, axillary or pectoral adenopathy.  Skin:    General: Skin is warm and dry.     Capillary Refill: Capillary refill takes less than 2 seconds.  Neurological:     General: No focal deficit present.     Mental Status: She is alert and oriented to person, place, and time.  Psychiatric:        Attention and Perception: Attention normal.        Mood and Affect: Mood normal. Affect is inappropriate.        Speech: Speech is slurred.        Thought Content: Thought  content normal.        Judgment: Judgment normal.     Results for orders placed or performed during the hospital encounter of 02/17/23  Basic metabolic panel  Result Value Ref Range   Sodium 137 135 - 145 mmol/L   Potassium 3.3 (L) 3.5 - 5.1 mmol/L   Chloride 106 98 - 111 mmol/L   CO2 23 22 - 32 mmol/L    Glucose, Bld 84 70 - 99 mg/dL   BUN 19 6 - 20 mg/dL   Creatinine, Ser 1.61 0.44 - 1.00 mg/dL   Calcium 8.5 (L) 8.9 - 10.3 mg/dL   GFR, Estimated >09 >60 mL/min   Anion gap 8 5 - 15  CBC with Differential  Result Value Ref Range   WBC 9.4 4.0 - 10.5 K/uL   RBC 3.91 3.87 - 5.11 MIL/uL   Hemoglobin 10.7 (L) 12.0 - 15.0 g/dL   HCT 45.4 (L) 09.8 - 11.9 %   MCV 87.2 80.0 - 100.0 fL   MCH 27.4 26.0 - 34.0 pg   MCHC 31.4 30.0 - 36.0 g/dL   RDW 14.7 82.9 - 56.2 %   Platelets 318 150 - 400 K/uL   nRBC 0.0 0.0 - 0.2 %   Neutrophils Relative % 68 %   Neutro Abs 6.3 1.7 - 7.7 K/uL   Lymphocytes Relative 24 %   Lymphs Abs 2.3 0.7 - 4.0 K/uL   Monocytes Relative 8 %   Monocytes Absolute 0.7 0.1 - 1.0 K/uL   Eosinophils Relative 0 %   Eosinophils Absolute 0.0 0.0 - 0.5 K/uL   Basophils Relative 0 %   Basophils Absolute 0.0 0.0 - 0.1 K/uL   Immature Granulocytes 0 %   Abs Immature Granulocytes 0.02 0.00 - 0.07 K/uL  hCG, serum, qualitative  Result Value Ref Range   Preg, Serum NEGATIVE NEGATIVE       Pertinent labs & imaging results that were available during my care of the patient were reviewed by me and considered in my medical decision making.  Assessment & Plan:  Lindsay Mccoy was seen today for breast mass and depression.  Diagnoses and all orders for this visit:  Hypokalemia Noted during ED visit 01/2023, will repeat labs today.  -     CMP14+EGFR  GERD without esophagitis No red flags present. Diet discussed. Avoid fried, spicy, fatty, greasy, and acidic foods. Avoid caffeine, nicotine, and alcohol. Do not eat 2-3 hours before bedtime and stay upright for at least 1-2 hours after eating. Eat small frequent meals. Avoid NSAID's like motrin and aleve. Medications as prescribed. Report any new or worsening symptoms. Follow up as discussed or sooner if needed.   -     pantoprazole (PROTONIX) 40 MG tablet; Take 1 tablet (40 mg total) by mouth daily.  Hemoglobin low Noted during ED visit,  will repeat labs today.  -     Anemia Profile B  Abnormal breast tissue Feels cystic in nature but does have a family history of breast cancer. Will order diagnostic imaging. Aware to avoid caffeine as this can make fibrocystic breast disease worse.  -     US BREAST COMPLETE UNI LEFT INC AXILLA -     US BREAST COMPLETE UNI RIGHT INC AXILLA -     MM 3D DIAGNOSTIC MAMMOGRAM BILATERAL BREAST  Adult ADHD Depression, recurrent (HCC) Has not followed up with psychiatry, will place new referral.  -     Ambulatory referral to Psychiatry  Chronic high back pain Has  not followed up with pain management, will place new referral today.  -     Ambulatory referral to Pain Clinic     Continue all other maintenance medications.  Follow up plan: Return in about 6 months (around 11/01/2023), or if symptoms worsen or fail to improve, for CPE.   Continue healthy lifestyle choices, including diet (rich in fruits, vegetables, and lean proteins, and low in salt and simple carbohydrates) and exercise (at least 30 minutes of moderate physical activity daily).   The above assessment and management plan was discussed with the patient. The patient verbalized understanding of and has agreed to the management plan. Patient is aware to call the clinic if they develop any new symptoms or if symptoms persist or worsen. Patient is aw/are when to return to the clinic for a follow-up visit. Patient educated on when it is appropriate to go to the emergency department.   Kari Baars, FNP-C [facility] [phone]

## 2023-05-02 NOTE — Telephone Encounter (Signed)
Reason for Referral:  Has the referral been discussed with the patient?: yes on 6/12  Designated contact for the referral if not the patient (name/phone number):   Has the patient seen a specialist for this issue before?: no  If so, who (practice/provider)?  Does the patient have a provider or location preference for the referral?: [facility] Would the patient like to see previous specialist if applicable?   [place] Spaniel that her husband goes to this facility and she would like to go there

## 2023-05-03 LAB — ANEMIA PROFILE B
Basophils Absolute: 0.1 10*3/uL (ref 0.0–0.2)
Basos: 1 %
EOS (ABSOLUTE): 0.1 10*3/uL (ref 0.0–0.4)
Eos: 2 %
Ferritin: 5 ng/mL — ABNORMAL LOW (ref 15–150)
Folate: 8.1 ng/mL (ref >3.0)
Hematocrit: 34.2 % (ref 34.0–46.6)
Hemoglobin: 10.5 g/dL — ABNORMAL LOW (ref 11.1–15.9)
Immature Grans (Abs): 0 10*3/uL (ref 0.0–0.1)
Immature Granulocytes: 0 %
Iron Saturation: 6 % — CL (ref 15–55)
Iron: 25 ug/dL — ABNORMAL LOW (ref 27–159)
Lymphocytes Absolute: 1.9 10*3/uL (ref 0.7–3.1)
Lymphs: 31 %
MCH: 25.5 pg — ABNORMAL LOW (ref 26.6–33.0)
MCHC: 30.7 g/dL — ABNORMAL LOW (ref 31.5–35.7)
MCV: 83 fL (ref 79–97)
Monocytes Absolute: 0.5 10*3/uL (ref 0.1–0.9)
Monocytes: 8 %
Neutrophils Absolute: 3.4 10*3/uL (ref 1.4–7.0)
Neutrophils: 58 %
Platelets: 328 10*3/uL (ref 150–450)
RBC: 4.12 x10E6/uL (ref 3.77–5.28)
RDW: 15 % (ref 11.7–15.4)
Retic Ct Pct: 1.7 % (ref 0.6–2.6)
Total Iron Binding Capacity: 419 ug/dL (ref 250–450)
UIBC: 394 ug/dL (ref 131–425)
Vitamin B-12: 172 pg/mL — ABNORMAL LOW (ref 232–1245)
WBC: 6 10*3/uL (ref 3.4–10.8)

## 2023-05-03 LAB — CMP14+EGFR
ALT: 12 IU/L (ref 0–32)
AST: 16 IU/L (ref 0–40)
Albumin/Globulin Ratio: 1.4
Albumin: 4.1 g/dL (ref 3.9–4.9)
Alkaline Phosphatase: 66 IU/L (ref 44–121)
BUN/Creatinine Ratio: 15 (ref 9–23)
BUN: 12 mg/dL (ref 6–20)
Bilirubin Total: 0.3 mg/dL (ref 0.0–1.2)
CO2: 21 mmol/L (ref 20–29)
Calcium: 9.2 mg/dL (ref 8.7–10.2)
Chloride: 105 mmol/L (ref 96–106)
Creatinine, Ser: 0.8 mg/dL (ref 0.57–1.00)
Globulin, Total: 2.9 g/dL (ref 1.5–4.5)
Glucose: 84 mg/dL (ref 70–99)
Potassium: 4.5 mmol/L (ref 3.5–5.2)
Sodium: 138 mmol/L (ref 134–144)
Total Protein: 7 g/dL (ref 6.0–8.5)
eGFR: 97 mL/min/{1.73_m2} (ref >59)

## 2023-05-03 MED ORDER — IRON (FERROUS SULFATE) 325 (65 FE) MG PO TABS: 325.0000 mg | ORAL_TABLET | Freq: Every day | ORAL | 6 refills | Status: DC

## 2023-05-03 NOTE — Addendum Note (Signed)
Addended by: Sonny Masters on: 05/03/2023 01:13 PM   Modules accepted: Orders

## 2023-05-04 ENCOUNTER — Other Ambulatory Visit: Payer: Self-pay

## 2023-05-04 DIAGNOSIS — N63 Unspecified lump in unspecified breast: Secondary | ICD-10-CM

## 2023-05-09 ENCOUNTER — Ambulatory Visit (INDEPENDENT_AMBULATORY_CARE_PROVIDER_SITE_OTHER): Payer: Managed Care, Other (non HMO) | Admitting: *Deleted

## 2023-05-09 ENCOUNTER — Telehealth: Payer: Self-pay | Admitting: *Deleted

## 2023-05-09 DIAGNOSIS — E538 Deficiency of other specified B group vitamins: Secondary | ICD-10-CM | POA: Diagnosis not present

## 2023-05-09 MED ORDER — CYANOCOBALAMIN 1000 MCG/ML IJ SOLN
1000.0000 ug | Freq: Once | INTRAMUSCULAR | Status: AC
  Administered 2023-05-09: 1000 ug via INTRAMUSCULAR

## 2023-05-09 NOTE — Telephone Encounter (Signed)
Patient aware.

## 2023-05-09 NOTE — Telephone Encounter (Signed)
Referral placed.

## 2023-05-09 NOTE — Telephone Encounter (Signed)
Patient came in for B12 injection. Patient would like a referral to orthopedics.

## 2023-05-09 NOTE — Telephone Encounter (Signed)
Lumbar back pain, patient prefers [facility] if possible.

## 2023-05-17 ENCOUNTER — Ambulatory Visit (HOSPITAL_COMMUNITY)
Admission: RE | Admit: 2023-05-17 | Discharge: 2023-05-17 | Disposition: A | Discharge status: Home / Self Care | Payer: Managed Care, Other (non HMO) | Source: Ambulatory Visit | Attending: Family Medicine | Admitting: Family Medicine

## 2023-05-17 ENCOUNTER — Encounter (HOSPITAL_COMMUNITY): Payer: Self-pay

## 2023-05-17 DIAGNOSIS — N6459 Other signs and symptoms in breast: Secondary | ICD-10-CM | POA: Insufficient documentation

## 2023-05-17 DIAGNOSIS — N63 Unspecified lump in unspecified breast: Secondary | ICD-10-CM | POA: Insufficient documentation

## 2023-05-19 ENCOUNTER — Other Ambulatory Visit: Payer: Self-pay | Admitting: Family Medicine

## 2023-05-19 DIAGNOSIS — K219 Gastro-esophageal reflux disease without esophagitis: Secondary | ICD-10-CM

## 2023-06-11 ENCOUNTER — Ambulatory Visit (INDEPENDENT_AMBULATORY_CARE_PROVIDER_SITE_OTHER): Payer: Managed Care, Other (non HMO)

## 2023-06-11 DIAGNOSIS — E538 Deficiency of other specified B group vitamins: Secondary | ICD-10-CM | POA: Diagnosis not present

## 2023-06-11 MED ORDER — CYANOCOBALAMIN 1000 MCG/ML IJ SOLN
1000.0000 ug | Freq: Once | INTRAMUSCULAR | Status: AC
  Administered 2023-06-11: 1000 ug via INTRAMUSCULAR

## 2023-06-11 NOTE — Progress Notes (Signed)
B12 given in right deltoid without difficulty. Pt has no concerns today.

## 2023-06-12 ENCOUNTER — Other Ambulatory Visit: Payer: Self-pay | Admitting: Anesthesiology

## 2023-06-12 DIAGNOSIS — M546 Pain in thoracic spine: Secondary | ICD-10-CM

## 2023-06-23 ENCOUNTER — Ambulatory Visit
Admission: RE | Admit: 2023-06-23 | Discharge: 2023-06-23 | Disposition: A | Discharge status: Home / Self Care | Payer: Managed Care, Other (non HMO) | Source: Ambulatory Visit | Attending: Anesthesiology | Admitting: Anesthesiology

## 2023-06-23 ENCOUNTER — Other Ambulatory Visit: Payer: Managed Care, Other (non HMO)

## 2023-06-23 DIAGNOSIS — M546 Pain in thoracic spine: Secondary | ICD-10-CM

## 2023-07-12 ENCOUNTER — Ambulatory Visit: Payer: Managed Care, Other (non HMO)

## 2023-07-13 ENCOUNTER — Ambulatory Visit (INDEPENDENT_AMBULATORY_CARE_PROVIDER_SITE_OTHER): Payer: Managed Care, Other (non HMO)

## 2023-07-13 DIAGNOSIS — E538 Deficiency of other specified B group vitamins: Secondary | ICD-10-CM

## 2023-07-13 MED ORDER — CYANOCOBALAMIN 1000 MCG/ML IJ SOLN
1000.0000 ug | INTRAMUSCULAR | Status: DC
  Administered 2023-07-13: 1000 ug via INTRAMUSCULAR

## 2023-07-13 NOTE — Progress Notes (Signed)
B12 injection given to patient and tolerated well.  

## 2023-07-20 ENCOUNTER — Other Ambulatory Visit: Payer: Self-pay | Admitting: Rehabilitation

## 2023-07-20 DIAGNOSIS — M419 Scoliosis, unspecified: Secondary | ICD-10-CM

## 2023-07-30 ENCOUNTER — Encounter: Payer: Self-pay | Admitting: Rehabilitation

## 2023-08-02 ENCOUNTER — Other Ambulatory Visit: Payer: Managed Care, Other (non HMO)

## 2023-08-14 ENCOUNTER — Ambulatory Visit: Payer: Managed Care, Other (non HMO)

## 2023-08-14 ENCOUNTER — Encounter: Payer: Self-pay | Admitting: Family Medicine

## 2023-08-14 ENCOUNTER — Ambulatory Visit (INDEPENDENT_AMBULATORY_CARE_PROVIDER_SITE_OTHER): Payer: Managed Care, Other (non HMO) | Admitting: Family Medicine

## 2023-08-14 VITALS — BP 117/72 | HR 78 | Temp 97.8°F | Ht 65.0 in | Wt 134.2 lb

## 2023-08-14 DIAGNOSIS — K625 Hemorrhage of anus and rectum: Secondary | ICD-10-CM | POA: Diagnosis not present

## 2023-08-14 DIAGNOSIS — N858 Other specified noninflammatory disorders of uterus: Secondary | ICD-10-CM | POA: Diagnosis not present

## 2023-08-14 DIAGNOSIS — K644 Residual hemorrhoidal skin tags: Secondary | ICD-10-CM

## 2023-08-14 NOTE — Progress Notes (Signed)
Subjective:  Patient ID: Lindsay Mccoy, female    DOB: 03/16/1986, 37 y.o.   MRN: [id number]  Patient Care Team: Sonny Masters, FNP as PCP - General (Family Medicine) Marjo Bicker, MD as PCP - Cardiology (Cardiology)   Chief Complaint:  [facility]  (9/19- rectal bleeding - states she is not having blood in stool as much.  Is still having nausea, abd pain & dark stools. )   HPI: Lindsay Mccoy is a 37 y.o. female presenting on 08/14/2023 for ER [facility]  (9/19- rectal bleeding - states she is not having blood in stool as much.  Is still having nausea, abd pain & dark stools. )   Pt presents today for ED follow up and referrals to GI and GYN. She states she was seen in the ED at [facility] for vaginal bleeding and pain, rectal bleeding and pain. Reports she was diagnosed with external hemorrhoids and uterine mass. She was told she needed to follow up with her PCP for referrals. Those ED records and results are not available for review and have been requested.      Relevant past medical, surgical, family, and social history reviewed and updated as indicated.  Allergies and medications reviewed and updated. Data reviewed: Chart in Epic.   Past Medical History:  Diagnosis Date   Bronchitis    Gastritis    Scoliosis     Past Surgical History:  Procedure Laterality Date   UPPER GI ENDOSCOPY      Social History   Socioeconomic History   Marital status: Married    Spouse name: Not on file   Number of children: Not on file   Years of education: Not on file   Highest education level: Not on file  Occupational History   Not on file  Tobacco Use   Smoking status: Former    Current packs/day: 0.00    Average packs/day: 0.5 packs/day for 20.0 years (10.0 ttl pk-yrs)    Types: Cigarettes    Start date: 10/06/1995    Quit date: 10/06/2015    Years since quitting: 7.8   Smokeless tobacco: Never  Vaping Use   Vaping status:  Every Day  Substance and Sexual Activity   Alcohol use: Yes    Comment: occassionally    Drug use: Never   Sexual activity: Yes    Birth control/protection: Surgical  Other Topics Concern   Not on file  Social History Narrative   Not on file   Social Determinants of Health   Financial Resource Strain: Low Risk  (03/01/2021)   Overall Financial Resource Strain (CARDIA)    Difficulty of Paying Living Expenses: Not hard at all  Food Insecurity: Food Insecurity Present (03/01/2021)   Hunger Vital Sign    Worried About Running Out of Food in the Last Year: Sometimes true    Ran Out of Food in the Last Year: Sometimes true  Transportation Needs: No Transportation Needs (03/01/2021)   PRAPARE - Administrator, Civil Service (Medical): No    Lack of Transportation (Non-Medical): No  Physical Activity: Sufficiently Active (03/01/2021)   Exercise Vital Sign    Days of Exercise per Week: 5 days    Minutes of Exercise per Session: 100 min  Stress: No Stress Concern Present (03/01/2021)   [employer] of Occupational Health - Occupational Stress Questionnaire    Feeling of Stress : Not at all  Social Connections: Unknown (04/03/2022)  Received from [facility], [facility]   Social Network    Social Network: Not on file  Intimate Partner Violence: Unknown (02/23/2022)   Received from [facility], [facility]   HITS    Physically Hurt: Not on file    Insult or Talk Down To: Not on file    Threaten Physical Harm: Not on file    Scream or Curse: Not on file    Outpatient Encounter Medications as of 08/14/2023  Medication Sig   celecoxib (CELEBREX) 100 MG capsule Take 100 mg by mouth 2 (two) times daily.   Iron, Ferrous Sulfate, 325 (65 Fe) MG TABS Take 325 mg by mouth daily.   misoprostol (CYTOTEC) 100 MCG tablet Take 100 mcg by mouth 4 (four) times daily.   pantoprazole (PROTONIX) 40 MG tablet TAKE 1 TABLET BY MOUTH DAILY   valACYclovir (VALTREX) 500 MG tablet Take  500 mg by mouth 2 (two) times daily.   [DISCONTINUED] gabapentin (NEURONTIN) 300 MG capsule Take 300 mg by mouth as needed. (Patient not taking: Reported on 05/02/2023)   [DISCONTINUED] meloxicam (MOBIC) 7.5 MG tablet Take 7.5 mg by mouth daily. (Patient not taking: Reported on 08/14/2023)   [DISCONTINUED] omeprazole (PRILOSEC) 20 MG capsule Take 1 capsule (20 mg total) by mouth 2 (two) times daily before a meal.   Facility-Administered Encounter Medications as of 08/14/2023  Medication   cyanocobalamin (VITAMIN B12) injection 1,000 mcg    No Known Allergies  Review of Systems  Cardiovascular:  Negative for chest pain, palpitations and leg swelling.  Gastrointestinal:  Positive for blood in stool and rectal pain. Negative for abdominal distention, abdominal pain, anal bleeding, constipation, diarrhea, nausea and vomiting.  Genitourinary:  Positive for vaginal bleeding and vaginal pain. Negative for decreased urine volume, difficulty urinating, menstrual problem and vaginal discharge.  Neurological:  Negative for dizziness, tremors, seizures, syncope, facial asymmetry, speech difficulty, weakness, light-headedness, numbness and headaches.  All other systems reviewed and are negative.       Objective:  BP 117/72   Pulse 78   Temp 97.8 F (36.6 C) (Temporal)   Ht 5\' 5"  (1.651 m)   Wt 134 lb 3.2 oz (60.9 kg)   LMP 07/22/2023 (Approximate)   SpO2 99%   BMI 22.33 kg/m    Wt Readings from Last 3 Encounters:  08/14/23 134 lb 3.2 oz (60.9 kg)  05/02/23 131 lb 3.2 oz (59.5 kg)  02/17/23 121 lb (54.9 kg)    Physical Exam Vitals and nursing note reviewed.  Constitutional:      Appearance: She is normal weight.     Comments: Appears older than stated age  HENT:     Head: Normocephalic and atraumatic.     Mouth/Throat:     Mouth: Mucous membranes are moist.  Eyes:     Pupils: Pupils are equal, round, and reactive to light.  Cardiovascular:     Rate and Rhythm: Normal rate and  regular rhythm.  Pulmonary:     Effort: Pulmonary effort is normal.     Breath sounds: Normal breath sounds.  Abdominal:     General: Bowel sounds are normal.     Palpations: Abdomen is soft.  Musculoskeletal:     Cervical back: Normal range of motion.  Skin:    General: Skin is warm and dry.     Capillary Refill: Capillary refill takes less than 2 seconds.     Coloration: Skin is not pale.  Neurological:     General: No focal deficit  present.     Mental Status: She is alert and oriented to person, place, and time.  Psychiatric:        Mood and Affect: Mood normal.        Behavior: Behavior normal.        Thought Content: Thought content normal.        Judgment: Judgment normal.     Results for orders placed or performed in visit on 05/02/23  Anemia Profile B  Result Value Ref Range   Total Iron Binding Capacity 419 250 - 450 ug/dL   UIBC 621 308 - 657 ug/dL   Iron 25 (L) 27 - 846 ug/dL   Iron Saturation 6 (LL) 15 - 55 %   Ferritin 5 (L) 15 - 150 ng/mL   Vitamin B-12 172 (L) 232 - 1,245 pg/mL   Folate 8.1 >3.0 ng/mL   WBC 6.0 3.4 - 10.8 x10E3/uL   RBC 4.12 3.77 - 5.28 x10E6/uL   Hemoglobin 10.5 (L) 11.1 - 15.9 g/dL   Hematocrit 96.2 95.2 - 46.6 %   MCV 83 79 - 97 fL   MCH 25.5 (L) 26.6 - 33.0 pg   MCHC 30.7 (L) 31.5 - 35.7 g/dL   RDW 84.1 32.4 - 40.1 %   Platelets 328 150 - 450 x10E3/uL   Neutrophils 58 Not Estab. %   Lymphs 31 Not Estab. %   Monocytes 8 Not Estab. %   Eos 2 Not Estab. %   Basos 1 Not Estab. %   Neutrophils Absolute 3.4 1.4 - 7.0 x10E3/uL   Lymphocytes Absolute 1.9 0.7 - 3.1 x10E3/uL   Monocytes Absolute 0.5 0.1 - 0.9 x10E3/uL   EOS (ABSOLUTE) 0.1 0.0 - 0.4 x10E3/uL   Basophils Absolute 0.1 0.0 - 0.2 x10E3/uL   Immature Granulocytes 0 Not Estab. %   Immature Grans (Abs) 0.0 0.0 - 0.1 x10E3/uL   Retic Ct Pct 1.7 0.6 - 2.6 %  CMP14+EGFR  Result Value Ref Range   Glucose 84 70 - 99 mg/dL   BUN 12 6 - 20 mg/dL   Creatinine, Ser 0.27 0.57 - 1.00  mg/dL   eGFR 97 >25 DG/UYQ/0.34   BUN/Creatinine Ratio 15 9 - 23   Sodium 138 134 - 144 mmol/L   Potassium 4.5 3.5 - 5.2 mmol/L   Chloride 105 96 - 106 mmol/L   CO2 21 20 - 29 mmol/L   Calcium 9.2 8.7 - 10.2 mg/dL   Total Protein 7.0 6.0 - 8.5 g/dL   Albumin 4.1 3.9 - 4.9 g/dL   Globulin, Total 2.9 1.5 - 4.5 g/dL   Albumin/Globulin Ratio 1.4    Bilirubin Total 0.3 0.0 - 1.2 mg/dL   Alkaline Phosphatase 66 44 - 121 IU/L   AST 16 0 - 40 IU/L   ALT 12 0 - 32 IU/L       Pertinent labs & imaging results that were available during my care of the patient were reviewed by me and considered in my medical decision making.  Assessment & Plan:  Lindsay Mccoy was seen today for er [facility] .  Diagnoses and all orders for this visit:  Hemorrhoids, external Rectal bleeding Referral placed and labs repeated. ED records requested.  -     Ambulatory referral to Gastroenterology -     CBC with Differential/Platelet  Uterine mass Referral placed and labs repeated. ED records requested.  -     Ambulatory referral to Gynecology -     CBC with Differential/Platelet -  BMP8+EGFR    Continue all other maintenance medications.  Follow up plan: Return if symptoms worsen or fail to improve.   Continue healthy lifestyle choices, including diet (rich in fruits, vegetables, and lean proteins, and low in salt and simple carbohydrates) and exercise (at least 30 minutes of moderate physical activity daily).    The above assessment and management plan was discussed with the patient. The patient verbalized understanding of and has agreed to the management plan. Patient is aware to call the clinic if they develop any new symptoms or if symptoms persist or worsen. Patient is aware when to return to the clinic for a follow-up visit. Patient educated on when it is appropriate to go to the emergency department.   Kari Baars, FNP-C [facility] [phone]

## 2023-08-15 ENCOUNTER — Encounter: Payer: Self-pay | Admitting: Internal Medicine

## 2023-08-15 LAB — BMP8+EGFR
BUN/Creatinine Ratio: 10 (ref 9–23)
BUN: 10 mg/dL (ref 6–20)
CO2: 22 mmol/L (ref 20–29)
Calcium: 9.4 mg/dL (ref 8.7–10.2)
Chloride: 101 mmol/L (ref 96–106)
Creatinine, Ser: 0.96 mg/dL (ref 0.57–1.00)
Glucose: 85 mg/dL (ref 70–99)
Potassium: 4.5 mmol/L (ref 3.5–5.2)
Sodium: 138 mmol/L (ref 134–144)
eGFR: 78 mL/min/{1.73_m2} (ref >59)

## 2023-08-15 LAB — CBC WITH DIFFERENTIAL/PLATELET
Basophils Absolute: 0.1 10*3/uL (ref 0.0–0.2)
Basos: 1 %
EOS (ABSOLUTE): 0.2 10*3/uL (ref 0.0–0.4)
Eos: 3 %
Hematocrit: 43 % (ref 34.0–46.6)
Hemoglobin: 14 g/dL (ref 11.1–15.9)
Immature Grans (Abs): 0.1 10*3/uL (ref 0.0–0.1)
Immature Granulocytes: 1 %
Lymphocytes Absolute: 2.4 10*3/uL (ref 0.7–3.1)
Lymphs: 40 %
MCH: 31 pg (ref 26.6–33.0)
MCHC: 32.6 g/dL (ref 31.5–35.7)
MCV: 95 fL (ref 79–97)
Monocytes Absolute: 0.4 10*3/uL (ref 0.1–0.9)
Monocytes: 7 %
Neutrophils Absolute: 3 10*3/uL (ref 1.4–7.0)
Neutrophils: 48 %
Platelets: 264 10*3/uL (ref 150–450)
RBC: 4.51 x10E6/uL (ref 3.77–5.28)
RDW: 14.3 % (ref 11.7–15.4)
WBC: 6.1 10*3/uL (ref 3.4–10.8)

## 2023-08-16 ENCOUNTER — Encounter: Payer: Self-pay | Admitting: Obstetrics & Gynecology

## 2023-08-16 ENCOUNTER — Ambulatory Visit: Payer: Managed Care, Other (non HMO) | Admitting: Obstetrics & Gynecology

## 2023-08-16 ENCOUNTER — Other Ambulatory Visit (HOSPITAL_COMMUNITY)
Admission: RE | Admit: 2023-08-16 | Discharge: 2023-08-16 | Disposition: A | Discharge status: Home / Self Care | Payer: 59 | Source: Ambulatory Visit | Attending: Obstetrics & Gynecology | Admitting: Obstetrics & Gynecology

## 2023-08-16 VITALS — BP 123/72 | HR 88 | Ht 65.0 in | Wt 135.0 lb

## 2023-08-16 DIAGNOSIS — N941 Unspecified dyspareunia: Secondary | ICD-10-CM

## 2023-08-16 DIAGNOSIS — Z124 Encounter for screening for malignant neoplasm of cervix: Secondary | ICD-10-CM | POA: Insufficient documentation

## 2023-08-16 DIAGNOSIS — N920 Excessive and frequent menstruation with regular cycle: Secondary | ICD-10-CM

## 2023-08-16 DIAGNOSIS — R102 Pelvic and perineal pain: Secondary | ICD-10-CM | POA: Diagnosis not present

## 2023-08-16 DIAGNOSIS — D219 Benign neoplasm of connective and other soft tissue, unspecified: Secondary | ICD-10-CM | POA: Diagnosis not present

## 2023-08-16 MED ORDER — SUCRALFATE 1 G PO TABS: 1.0000 g | ORAL_TABLET | Freq: Three times a day (TID) | ORAL | 1 refills | Status: DC

## 2023-08-16 NOTE — Progress Notes (Signed)
Chief Complaint  Patient presents with   Follow-up    Seen in ER in [place], no records in care everywhere.       37 y.o. [id number] Patient's last menstrual period was 07/22/2023 (approximate). The current method of family planning is tubal ligation.  Outpatient Encounter Medications as of 08/16/2023  Medication Sig   celecoxib (CELEBREX) 100 MG capsule Take 100 mg by mouth 2 (two) times daily.   Iron, Ferrous Sulfate, 325 (65 Fe) MG TABS Take 325 mg by mouth daily.   pantoprazole (PROTONIX) 40 MG tablet TAKE 1 TABLET BY MOUTH DAILY   sucralfate (CARAFATE) 1 g tablet Take 1 tablet (1 g total) by mouth 4 (four) times daily -  with meals and at bedtime.   valACYclovir (VALTREX) 500 MG tablet Take 500 mg by mouth 2 (two) times daily.   misoprostol (CYTOTEC) 100 MCG tablet Take 100 mcg by mouth 4 (four) times daily. (Patient not taking: Reported on 08/16/2023)   [DISCONTINUED] omeprazole (PRILOSEC) 20 MG capsule Take 1 capsule (20 mg total) by mouth 2 (two) times daily before a meal.   Facility-Administered Encounter Medications as of 08/16/2023  Medication   cyanocobalamin (VITAMIN B12) injection 1,000 mcg    Subjective Pt is seen as referral from [facility] for pelvic mass Unfortunately her scans from her [facility] are not on Care everywhere But she did show me on her cell phone But I think there is a typo with her sonogram report If I use 13.2 x 7.3 x12.1 x 0.5233=610 cc  Her exam today confirms a fibroid filling her bony pelvis with a very retroverted and retroflexed uterus, it is completely filling the pelvis and I can push it out with some effort  She is having heavy bleeding first 3 days of bleeding She has also started to have dysparuenia  She has had acute pelvic pressure issues  I think the uterus has just now gotten to the size of filling the pelvis completely causing acute symptoms Past Medical History:  Diagnosis Date   Bronchitis     Gastritis    Scoliosis     Past Surgical History:  Procedure Laterality Date   UPPER GI ENDOSCOPY      OB History     Gravida  2   Para  2   Term  2   Preterm      AB      Living  2      SAB      IAB      Ectopic      Multiple      Live Births              No Known Allergies  Social History   Socioeconomic History   Marital status: Married    Spouse name: Not on file   Number of children: Not on file   Years of education: Not on file   Highest education level: Not on file  Occupational History   Not on file  Tobacco Use   Smoking status: Former    Current packs/day: 0.00    Average packs/day: 0.5 packs/day for 20.0 years (10.0 ttl pk-yrs)    Types: Cigarettes    Start date: 10/06/1995    Quit date: 10/06/2015    Years since quitting: 7.8   Smokeless tobacco: Never  Vaping Use   Vaping status: Every Day  Substance and Sexual Activity   Alcohol use: Yes  Comment: occassionally    Drug use: Never   Sexual activity: Yes    Birth control/protection: Surgical  Other Topics Concern   Not on file  Social History Narrative   Not on file   Social Determinants of Health   Financial Resource Strain: Low Risk  (03/01/2021)   Overall Financial Resource Strain (CARDIA)    Difficulty of Paying Living Expenses: Not hard at all  Food Insecurity: Food Insecurity Present (03/01/2021)   Hunger Vital Sign    Worried About Running Out of Food in the Last Year: Sometimes true    Ran Out of Food in the Last Year: Sometimes true  Transportation Needs: No Transportation Needs (03/01/2021)   PRAPARE - Administrator, Civil Service (Medical): No    Lack of Transportation (Non-Medical): No  Physical Activity: Sufficiently Active (03/01/2021)   Exercise Vital Sign    Days of Exercise per Week: 5 days    Minutes of Exercise per Session: 100 min  Stress: No Stress Concern Present (03/01/2021)   [employer] of Occupational Health - Occupational  Stress Questionnaire    Feeling of Stress : Not at all  Social Connections: Unknown (04/03/2022)   Received from [facility], [facility]   Social Network    Social Network: Not on file    Family History  Problem Relation Age of Onset   Scoliosis Mother    Asthma Mother    Breast cancer Maternal Aunt    Breast cancer Maternal Grandmother     Medications:       Current Outpatient Medications:    celecoxib (CELEBREX) 100 MG capsule, Take 100 mg by mouth 2 (two) times daily., Disp: , Rfl:    Iron, Ferrous Sulfate, 325 (65 Fe) MG TABS, Take 325 mg by mouth daily., Disp: 30 tablet, Rfl: 6   pantoprazole (PROTONIX) 40 MG tablet, TAKE 1 TABLET BY MOUTH DAILY, Disp: 90 tablet, Rfl: 1   sucralfate (CARAFATE) 1 g tablet, Take 1 tablet (1 g total) by mouth 4 (four) times daily -  with meals and at bedtime., Disp: 90 tablet, Rfl: 1   valACYclovir (VALTREX) 500 MG tablet, Take 500 mg by mouth 2 (two) times daily., Disp: , Rfl:    misoprostol (CYTOTEC) 100 MCG tablet, Take 100 mcg by mouth 4 (four) times daily. (Patient not taking: Reported on 08/16/2023), Disp: , Rfl:   Current Facility-Administered Medications:    cyanocobalamin (VITAMIN B12) injection 1,000 mcg, 1,000 mcg, Intramuscular, Q30 days, Rakes, Linda M, FNP, 1,000 mcg at 07/13/23 0840  Objective Blood pressure 123/72, pulse 88, height 5\' 5"  (1.651 m), weight 135 lb (61.2 kg), last menstrual period 07/22/2023.  General WDWN female NAD Vulva:  normal appearing vulva with no masses, tenderness or lesions Vagina:  normal mucosa, no discharge Cervix:  Normal no lesions Pap is done Uterus:  uterus retroverted + retroflexed (C shape) with fibroid uterus filling the pelvis completely does not really move out of the pelvis at all Adnexa: ovaries:present,  normal adnexa in size, nontender and no masses   Pertinent ROS No burning with urination, frequency or urgency No nausea, vomiting or diarrhea Nor fever chills or other  constitutional symptoms   Labs or studies Reviewed her labs and scans on the patient's phone    Impression + Management Plan: Diagnoses this Encounter::   ICD-10-CM   1. Fibroids, large fundal, casuing extreme retroversion and flexion filling the pelvis, volume 610 cc  D21.9    interestingly she had a  scan 08/2019 with a normal uterine size at that time    2. Menorrhagia with regular cycle  N92.0     3. Dyspareunia in female  N94.10     4. Pelvic pain, off on the past month ^ last 2 weeks  R10.2     5. Routine cervical smear  Z12.4 Cytology - PAP( [place])      No real long term options for management except hysterectomy with a uterus this size  Medications prescribed during  this encounter: Meds ordered this encounter  Medications   sucralfate (CARAFATE) 1 g tablet    Sig: Take 1 tablet (1 g total) by mouth 4 (four) times daily -  with meals and at bedtime.    Dispense:  90 tablet    Refill:  1    Labs or Scans Ordered during this encounter: No orders of the defined types were placed in this encounter.     Follow up Return in about 4 weeks (around 09/13/2023) for Post Op, with Dr Despina Hidden.

## 2023-08-17 ENCOUNTER — Encounter: Payer: Self-pay | Admitting: Family Medicine

## 2023-08-17 ENCOUNTER — Encounter: Payer: Self-pay | Admitting: Obstetrics & Gynecology

## 2023-08-21 LAB — CYTOLOGY - PAP
Chlamydia: NEGATIVE
Comment: NEGATIVE
Comment: NEGATIVE
Comment: NORMAL
Diagnosis: UNDETERMINED — AB
High risk HPV: NEGATIVE
Neisseria Gonorrhea: NEGATIVE

## 2023-08-31 NOTE — Patient Instructions (Signed)
Lindsay Mccoy  08/31/2023     @[email or web address] @   Your procedure is scheduled on  09/05/2023.   Report to [facility] at  0600  A.M.   Call this number if you have problems the morning of surgery:  [phone]  If you experience any cold or flu symptoms such as cough, fever, chills, shortness of breath, etc. between now and your scheduled surgery, please notify us at the above number.   Remember:  Do not eat after midnight.    You may drink clear liquids until 0330 am on 09/05/2023.    Clear liquids allowed are:                    Water, Carbonated beverages (diabetics please choose diet or no sugar options), Black Coffee Only (No creamer, milk or cream, including half & half and powdered creamer), and Clear Sports drink (No red color; diabetics please choose diet or no sugar options)     At 0330 am on 09/05/2023 drink your carb drink. You can have nothing else to drink after this.     Take these medicines the morning of surgery with A SIP OF WATER                           vyvanse, pantoprazole, sertraline.    Do not wear jewelry, make-up or nail polish, including gel polish,  artificial nails, or any other type of covering on natural nails (fingers and  toes).  Do not wear lotions, powders, or perfumes, or deodorant.  Do not shave 48 hours prior to surgery.  Men may shave face and neck.  Do not bring valuables to the hospital.  [facility] is not responsible for any belongings or valuables.  Contacts, dentures or bridgework may not be worn into surgery.  Leave your suitcase in the car.  After surgery it may be brought to your room.  For patients admitted to the hospital, discharge time will be determined by your treatment team.  Patients discharged the day of surgery will not be allowed to drive home and must have someone with them for 24 hours.    Special instructions:   DO NOT smoke tobacco or vape for 24 hours before your procedure.  Please read over  the following fact sheets that you were given. Pain Booklet, Coughing and Deep Breathing, Surgical Site Infection Prevention, Anesthesia Post-op Instructions, and Care and Recovery After Surgery       Total Laparoscopic Hysterectomy, Care After The following information offers guidance on how to care for yourself after your procedure. Your health care provider may also give you more specific instructions. If you have problems or questions, contact your health care provider. What can I expect after the procedure? After the procedure, it is common to have: Pain, bruising, and numbness around your incisions. Tiredness (fatigue). Poor appetite. Less interest in sex. Vaginal discharge or bleeding. You will need to use a sanitary pad after this procedure. Feelings of sadness or other emotions. If your ovaries were also removed, it is also common to have symptoms of menopause, such as hot flashes, night sweats, and lack of sleep (insomnia). Follow these instructions at home: Medicines Take over-the-counter and prescription medicines only as told by your health care provider. Ask your health care provider if the medicine prescribed to you: Requires you to avoid driving or using machinery. Can cause constipation. You may need to  take these actions to prevent or treat constipation: Drink enough fluid to keep your urine pale yellow. Take over-the-counter or prescription medicines. Eat foods that are high in fiber, such as beans, whole grains, and fresh fruits and vegetables. Limit foods that are high in fat and processed sugars, such as fried or sweet foods. Incision care  Follow instructions from your health care provider about how to take care of your incisions. Make sure you: Wash your hands with soap and water for at least 20 seconds before and after you change your bandage (dressing). If soap and water are not available, use Matheny sanitizer. Change your dressing as told by your health care  provider. Leave stitches (sutures), skin glue, or adhesive strips in place. These skin closures may need to stay in place for 2 weeks or longer. If adhesive strip edges start to loosen and curl up, you may trim the loose edges. Do not remove adhesive strips completely unless your health care provider tells you to do that. Check your incision areas every day for signs of infection. Check for: More redness, swelling, or pain. Fluid or blood. Warmth. Pus or a bad smell. Activity  Rest as told by your health care provider. Avoid sitting for a long time without moving. Get up to take short walks every 1-2 hours. This is important to improve blood flow and breathing. Ask for help if you feel weak or unsteady. Return to your normal activities as told by your health care provider. Ask your health care provider what activities are safe for you. Do not lift anything that is heavier than 10 lb (4.5 kg), or the limit that you are told, for one month after surgery or until your health care provider says that it is safe. If you were given a sedative during the procedure, it can affect you for several hours. Do not drive or operate machinery until your health care provider says that it is safe. Lifestyle Do not use any products that contain nicotine or tobacco. These products include cigarettes, chewing tobacco, and vaping devices, such as e-cigarettes. These can delay healing after surgery. If you need help quitting, ask your health care provider. Do not drink alcohol until your health care provider approves. General instructions  Do not douche, use tampons, or have sex for at least 6 weeks, or as told by your health care provider. If you struggle with physical or emotional changes after your procedure, speak with your health care provider or a therapist. Do not take baths, swim, or use a hot tub until your health care provider approves. You may only be allowed to take showers for 2-3 weeks. Keep your  dressing dry until your health care provider says it can be removed. Try to have someone at home with you for the first 1-2 weeks to help with your daily chores. Wear compression stockings as told by your health care provider. These stockings help to prevent blood clots and reduce swelling in your legs. Keep all follow-up visits. This is important. Contact a health care provider if: You have any of these signs of infection: Chills or a fever. More redness, swelling, or pain around an incision. Fluid or blood coming from an incision. Warmth coming from an incision. Pus or a bad smell coming from an incision. An incision opens. You feel dizzy or light-headed. You have pain or bleeding when you urinate, or you are unable to urinate. You have abnormal vaginal discharge. You have pain that does not get better  with medicine. Get help right away if: You have a fever and your symptoms suddenly get worse. You have severe abdominal pain. You have chest pain or shortness of breath. You faint. You have pain, swelling, or redness in your leg. You have heavy vaginal bleeding with blood clots, soaking through a sanitary pad in less than 1 hour. These symptoms may represent a serious problem that is an emergency. Do not wait to see if the symptoms will go away. Get medical help right away. Call your local emergency services (911 in the [place] Do not drive yourself to the hospital. Summary After the procedure, it is common to have pain and bruising around your incisions. Do not take baths, swim, or use a hot tub until your health care provider approves. Do not lift anything that is heavier than 10 lb (4.5 kg), or the limit that you are told, for one month after surgery or until your health care provider says that it is safe. Tell your health care provider if you have any signs or symptoms of infection after the procedure. Get help right away if you have severe abdominal pain, chest pain, shortness of  breath, or heavy bleeding from your vagina. This information is not intended to replace advice given to you by your health care provider. Make sure you discuss any questions you have with your health care provider. Document Revised: 07/08/2020 Document Reviewed: 07/09/2020 Elsevier Patient Education  2024 [employer] General Anesthesia, Adult, Care After The following information offers guidance on how to care for yourself after your procedure. Your health care provider may also give you more specific instructions. If you have problems or questions, contact your health care provider. What can I expect after the procedure? After the procedure, it is common for people to: Have pain or discomfort at the IV site. Have nausea or vomiting. Have a sore throat or hoarseness. Have trouble concentrating. Feel cold or chills. Feel weak, sleepy, or tired (fatigue). Have soreness and body aches. These can affect parts of the body that were not involved in surgery. Follow these instructions at home: For the time period you were told by your health care provider:  Rest. Do not participate in activities where you could fall or become injured. Do not drive or use machinery. Do not drink alcohol. Do not take sleeping pills or medicines that cause drowsiness. Do not make important decisions or sign legal documents. Do not take care of children on your own. General instructions Drink enough fluid to keep your urine pale yellow. If you have sleep apnea, surgery and certain medicines can increase your risk for breathing problems. Follow instructions from your health care provider about wearing your sleep device: Anytime you are sleeping, including during daytime naps. While taking prescription pain medicines, sleeping medicines, or medicines that make you drowsy. Return to your normal activities as told by your health care provider. Ask your health care provider what activities are safe for you. Take  over-the-counter and prescription medicines only as told by your health care provider. Do not use any products that contain nicotine or tobacco. These products include cigarettes, chewing tobacco, and vaping devices, such as e-cigarettes. These can delay incision healing after surgery. If you need help quitting, ask your health care provider. Contact a health care provider if: You have nausea or vomiting that does not get better with medicine. You vomit every time you eat or drink. You have pain that does not get better with medicine. You cannot urinate or  have bloody urine. You develop a skin rash. You have a fever. Get help right away if: You have trouble breathing. You have chest pain. You vomit blood. These symptoms may be an emergency. Get help right away. Call 911. Do not wait to see if the symptoms will go away. Do not drive yourself to the hospital. Summary After the procedure, it is common to have a sore throat, hoarseness, nausea, vomiting, or to feel weak, sleepy, or fatigue. For the time period you were told by your health care provider, do not drive or use machinery. Get help right away if you have difficulty breathing, have chest pain, or vomit blood. These symptoms may be an emergency. This information is not intended to replace advice given to you by your health care provider. Make sure you discuss any questions you have with your health care provider. Document Revised: 02/03/2022 Document Reviewed: 02/03/2022 Elsevier Patient Education  2024 [employer] How to Use Chlorhexidine Before Surgery Chlorhexidine gluconate (CHG) is a germ-killing (antiseptic) solution that is used to clean the skin. It can get rid of the bacteria that normally live on the skin and can keep them away for about 24 hours. To clean your skin with CHG, you may be given: A CHG solution to use in the shower or as part of a sponge bath. A prepackaged cloth that contains CHG. Cleaning your skin with  CHG may help lower the risk for infection: While you are staying in the intensive care unit of the hospital. If you have a vascular access, such as a central line, to provide short-term or long-term access to your veins. If you have a catheter to drain urine from your bladder. If you are on a ventilator. A ventilator is a machine that helps you breathe by moving air in and out of your lungs. After surgery. What are the risks? Risks of using CHG include: A skin reaction. Hearing loss, if CHG gets in your ears and you have a perforated eardrum. Eye injury, if CHG gets in your eyes and is not rinsed out. The CHG product catching fire. Make sure that you avoid smoking and flames after applying CHG to your skin. Do not use CHG: If you have a chlorhexidine allergy or have previously reacted to chlorhexidine. On babies younger than 44 months of age. How to use CHG solution Use CHG only as told by your health care provider, and follow the instructions on the label. Use the full amount of CHG as directed. Usually, this is one bottle. During a shower Follow these steps when using CHG solution during a shower (unless your health care provider gives you different instructions): Start the shower. Use your normal soap and shampoo to wash your face and hair. Turn off the shower or move out of the shower stream. Pour the CHG onto a clean washcloth. Do not use any type of brush or rough-edged sponge. Starting at your neck, lather your body down to your toes. Make sure you follow these instructions: If you will be having surgery, pay special attention to the part of your body where you will be having surgery. Scrub this area for at least 1 minute. Do not use CHG on your head or face. If the solution gets into your ears or eyes, rinse them well with water. Avoid your genital area. Avoid any areas of skin that have broken skin, cuts, or scrapes. Scrub your back and under your arms. Make sure to wash skin  folds. Let the  lather sit on your skin for 1-2 minutes or as long as told by your health care provider. Thoroughly rinse your entire body in the shower. Make sure that all body creases and crevices are rinsed well. Dry off with a clean towel. Do not put any substances on your body afterward--such as powder, lotion, or perfume--unless you are told to do so by your health care provider. Only use lotions that are recommended by the manufacturer. Put on clean clothes or pajamas. If it is the night before your surgery, sleep in clean sheets.  During a sponge bath Follow these steps when using CHG solution during a sponge bath (unless your health care provider gives you different instructions): Use your normal soap and shampoo to wash your face and hair. Pour the CHG onto a clean washcloth. Starting at your neck, lather your body down to your toes. Make sure you follow these instructions: If you will be having surgery, pay special attention to the part of your body where you will be having surgery. Scrub this area for at least 1 minute. Do not use CHG on your head or face. If the solution gets into your ears or eyes, rinse them well with water. Avoid your genital area. Avoid any areas of skin that have broken skin, cuts, or scrapes. Scrub your back and under your arms. Make sure to wash skin folds. Let the lather sit on your skin for 1-2 minutes or as long as told by your health care provider. Using a different clean, wet washcloth, thoroughly rinse your entire body. Make sure that all body creases and crevices are rinsed well. Dry off with a clean towel. Do not put any substances on your body afterward--such as powder, lotion, or perfume--unless you are told to do so by your health care provider. Only use lotions that are recommended by the manufacturer. Put on clean clothes or pajamas. If it is the night before your surgery, sleep in clean sheets. How to use CHG prepackaged cloths Only use CHG  cloths as told by your health care provider, and follow the instructions on the label. Use the CHG cloth on clean, dry skin. Do not use the CHG cloth on your head or face unless your health care provider tells you to. When washing with the CHG cloth: Avoid your genital area. Avoid any areas of skin that have broken skin, cuts, or scrapes. Before surgery Follow these steps when using a CHG cloth to clean before surgery (unless your health care provider gives you different instructions): Using the CHG cloth, vigorously scrub the part of your body where you will be having surgery. Scrub using a back-and-forth motion for 3 minutes. The area on your body should be completely wet with CHG when you are done scrubbing. Do not rinse. Discard the cloth and let the area air-dry. Do not put any substances on the area afterward, such as powder, lotion, or perfume. Put on clean clothes or pajamas. If it is the night before your surgery, sleep in clean sheets.  For general bathing Follow these steps when using CHG cloths for general bathing (unless your health care provider gives you different instructions). Use a separate CHG cloth for each area of your body. Make sure you wash between any folds of skin and between your fingers and toes. Wash your body in the following order, switching to a new cloth after each step: The front of your neck, shoulders, and chest. Both of your arms, under your arms, and your  hands. Your stomach and groin area, avoiding the genitals. Your right leg and foot. Your left leg and foot. The back of your neck, your back, and your buttocks. Do not rinse. Discard the cloth and let the area air-dry. Do not put any substances on your body afterward--such as powder, lotion, or perfume--unless you are told to do so by your health care provider. Only use lotions that are recommended by the manufacturer. Put on clean clothes or pajamas. Contact a health care provider if: Your skin gets  irritated after scrubbing. You have questions about using your solution or cloth. You swallow any chlorhexidine. Call your local poison control center [phone])523-4005 in the [place] Get help right away if: Your eyes itch badly, or they become very red or swollen. Your skin itches badly and is red or swollen. Your hearing changes. You have trouble seeing. You have swelling or tingling in your mouth or throat. You have trouble breathing. These symptoms may represent a serious problem that is an emergency. Do not wait to see if the symptoms will go away. Get medical help right away. Call your local emergency services (911 in the [place] Do not drive yourself to the hospital. Summary Chlorhexidine gluconate (CHG) is a germ-killing (antiseptic) solution that is used to clean the skin. Cleaning your skin with CHG may help to lower your risk for infection. You may be given CHG to use for bathing. It may be in a bottle or in a prepackaged cloth to use on your skin. Carefully follow your health care provider's instructions and the instructions on the product label. Do not use CHG if you have a chlorhexidine allergy. Contact your health care provider if your skin gets irritated after scrubbing. This information is not intended to replace advice given to you by your health care provider. Make sure you discuss any questions you have with your health care provider. Document Revised: 03/06/2022 Document Reviewed: 01/17/2021 Elsevier Patient Education  2023 [employer].

## 2023-09-03 ENCOUNTER — Other Ambulatory Visit: Payer: Self-pay | Admitting: Obstetrics & Gynecology

## 2023-09-03 ENCOUNTER — Encounter (HOSPITAL_COMMUNITY)
Admission: RE | Admit: 2023-09-03 | Discharge: 2023-09-03 | Disposition: A | Discharge status: Home / Self Care | Payer: 59 | Source: Ambulatory Visit | Attending: Obstetrics & Gynecology | Admitting: Obstetrics & Gynecology

## 2023-09-03 ENCOUNTER — Encounter (HOSPITAL_COMMUNITY): Payer: Self-pay

## 2023-09-03 DIAGNOSIS — Z01812 Encounter for preprocedural laboratory examination: Secondary | ICD-10-CM | POA: Insufficient documentation

## 2023-09-03 DIAGNOSIS — Z01818 Encounter for other preprocedural examination: Secondary | ICD-10-CM

## 2023-09-03 HISTORY — DX: Gastro-esophageal reflux disease without esophagitis: K21.9

## 2023-09-03 LAB — COMPREHENSIVE METABOLIC PANEL
ALT: 10 U/L (ref 0–44)
AST: 15 U/L (ref 15–41)
Albumin: 3.9 g/dL (ref 3.5–5.0)
Alkaline Phosphatase: 44 U/L (ref 38–126)
Anion gap: 7 (ref 5–15)
BUN: 11 mg/dL (ref 6–20)
CO2: 22 mmol/L (ref 22–32)
Calcium: 8.3 mg/dL — ABNORMAL LOW (ref 8.9–10.3)
Chloride: 106 mmol/L (ref 98–111)
Creatinine, Ser: 0.68 mg/dL (ref 0.44–1.00)
GFR, Estimated: >60 mL/min (ref >60)
Glucose, Bld: 88 mg/dL (ref 70–99)
Potassium: 3.1 mmol/L — ABNORMAL LOW (ref 3.5–5.1)
Sodium: 135 mmol/L (ref 135–145)
Total Bilirubin: 0.7 mg/dL (ref 0.3–1.2)
Total Protein: 6.9 g/dL (ref 6.5–8.1)

## 2023-09-03 LAB — RAPID HIV SCREEN (HIV 1/2 AB+AG)
HIV 1/2 Antibodies: NONREACTIVE
HIV-1 P24 Antigen - HIV24: NONREACTIVE

## 2023-09-03 LAB — URINALYSIS, ROUTINE W REFLEX MICROSCOPIC
Bilirubin Urine: NEGATIVE
Glucose, UA: NEGATIVE mg/dL
Ketones, ur: NEGATIVE mg/dL
Nitrite: NEGATIVE
Protein, ur: NEGATIVE mg/dL
Specific Gravity, Urine: <1.005 — ABNORMAL LOW (ref 1.005–1.030)
pH: 6 (ref 5.0–8.0)

## 2023-09-03 LAB — URINALYSIS, MICROSCOPIC (REFLEX): Squamous Epithelial / HPF: >50 /[HPF] (ref 0–5)

## 2023-09-03 LAB — CBC
HCT: 38.4 % (ref 36.0–46.0)
Hemoglobin: 13 g/dL (ref 12.0–15.0)
MCH: 31.4 pg (ref 26.0–34.0)
MCHC: 33.9 g/dL (ref 30.0–36.0)
MCV: 92.8 fL (ref 80.0–100.0)
Platelets: 255 10*3/uL (ref 150–400)
RBC: 4.14 MIL/uL (ref 3.87–5.11)
RDW: 13.2 % (ref 11.5–15.5)
WBC: 6.9 10*3/uL (ref 4.0–10.5)
nRBC: 0 % (ref 0.0–0.2)

## 2023-09-03 LAB — PREGNANCY, URINE: Preg Test, Ur: NEGATIVE

## 2023-09-03 MED ORDER — POTASSIUM CHLORIDE CRYS ER 20 MEQ PO TBCR: 20.0000 meq | EXTENDED_RELEASE_TABLET | Freq: Two times a day (BID) | ORAL | 0 refills | Status: DC

## 2023-09-04 ENCOUNTER — Other Ambulatory Visit (HOSPITAL_COMMUNITY)
Admission: RE | Admit: 2023-09-04 | Discharge: 2023-09-04 | Disposition: A | Discharge status: Home / Self Care | Payer: Managed Care, Other (non HMO) | Source: Ambulatory Visit | Attending: Obstetrics & Gynecology | Admitting: Obstetrics & Gynecology

## 2023-09-04 DIAGNOSIS — Z01818 Encounter for other preprocedural examination: Secondary | ICD-10-CM | POA: Insufficient documentation

## 2023-09-04 LAB — TYPE AND SCREEN
ABO/RH(D): A NEG
Antibody Screen: NEGATIVE

## 2023-09-05 ENCOUNTER — Encounter (HOSPITAL_COMMUNITY)
Admission: RE | Disposition: A | Discharge status: Home / Self Care | Payer: Self-pay | Source: Home / Self Care | Attending: Obstetrics & Gynecology

## 2023-09-05 ENCOUNTER — Ambulatory Visit (HOSPITAL_COMMUNITY): Payer: Self-pay | Admitting: Certified Registered Nurse Anesthetist

## 2023-09-05 ENCOUNTER — Encounter (HOSPITAL_COMMUNITY): Payer: Self-pay | Admitting: Obstetrics & Gynecology

## 2023-09-05 ENCOUNTER — Ambulatory Visit (HOSPITAL_BASED_OUTPATIENT_CLINIC_OR_DEPARTMENT_OTHER): Payer: Managed Care, Other (non HMO) | Admitting: Certified Registered Nurse Anesthetist

## 2023-09-05 ENCOUNTER — Other Ambulatory Visit: Payer: Self-pay

## 2023-09-05 ENCOUNTER — Ambulatory Visit (HOSPITAL_COMMUNITY)
Admission: RE | Admit: 2023-09-05 | Discharge: 2023-09-05 | Disposition: A | Discharge status: Home / Self Care | Payer: Managed Care, Other (non HMO) | Attending: Obstetrics & Gynecology | Admitting: Obstetrics & Gynecology

## 2023-09-05 DIAGNOSIS — Z87891 Personal history of nicotine dependence: Secondary | ICD-10-CM | POA: Insufficient documentation

## 2023-09-05 DIAGNOSIS — N8003 Adenomyosis of the uterus: Secondary | ICD-10-CM | POA: Diagnosis not present

## 2023-09-05 DIAGNOSIS — N946 Dysmenorrhea, unspecified: Secondary | ICD-10-CM

## 2023-09-05 DIAGNOSIS — K219 Gastro-esophageal reflux disease without esophagitis: Secondary | ICD-10-CM | POA: Insufficient documentation

## 2023-09-05 DIAGNOSIS — N920 Excessive and frequent menstruation with regular cycle: Secondary | ICD-10-CM | POA: Diagnosis present

## 2023-09-05 DIAGNOSIS — R102 Pelvic and perineal pain: Secondary | ICD-10-CM

## 2023-09-05 DIAGNOSIS — D219 Benign neoplasm of connective and other soft tissue, unspecified: Secondary | ICD-10-CM | POA: Diagnosis not present

## 2023-09-05 DIAGNOSIS — Z01818 Encounter for other preprocedural examination: Secondary | ICD-10-CM

## 2023-09-05 DIAGNOSIS — J45909 Unspecified asthma, uncomplicated: Secondary | ICD-10-CM | POA: Diagnosis not present

## 2023-09-05 DIAGNOSIS — N80203 Endometriosis of bilateral fallopian tubes, unspecified depth: Secondary | ICD-10-CM

## 2023-09-05 DIAGNOSIS — N941 Unspecified dyspareunia: Secondary | ICD-10-CM | POA: Diagnosis not present

## 2023-09-05 HISTORY — PX: ROBOTIC ASSISTED TOTAL HYSTERECTOMY WITH BILATERAL SALPINGO OOPHERECTOMY: SHX6086

## 2023-09-05 SURGERY — HYSTERECTOMY, TOTAL, ROBOT-ASSISTED, LAPAROSCOPIC, WITH BILATERAL SALPINGO-OOPHORECTOMY
Anesthesia: General | Site: Abdomen | Laterality: Bilateral

## 2023-09-05 MED ORDER — ONDANSETRON HCL 4 MG/2ML IJ SOLN
INTRAMUSCULAR | Status: AC
  Filled 2023-09-05: qty 2

## 2023-09-05 MED ORDER — ROCURONIUM BROMIDE 10 MG/ML (PF) SYRINGE
PREFILLED_SYRINGE | INTRAVENOUS | Status: AC
  Filled 2023-09-05: qty 10

## 2023-09-05 MED ORDER — OXYCODONE HCL 5 MG/5ML PO SOLN: 5.0000 mg | Freq: Once | ORAL | Status: DC | PRN

## 2023-09-05 MED ORDER — BUPIVACAINE HCL 0.25 % IJ SOLN
INTRAMUSCULAR | Status: DC | PRN
  Administered 2023-09-05: 40 mL

## 2023-09-05 MED ORDER — OXYCODONE HCL 5 MG PO TABS: 5.0000 mg | ORAL_TABLET | Freq: Once | ORAL | Status: DC | PRN

## 2023-09-05 MED ORDER — FENTANYL CITRATE PF 50 MCG/ML IJ SOSY
25.0000 ug | PREFILLED_SYRINGE | INTRAMUSCULAR | Status: DC | PRN
  Administered 2023-09-05 (×2): 50 ug via INTRAVENOUS
  Filled 2023-09-05 (×2): qty 1

## 2023-09-05 MED ORDER — SUGAMMADEX SODIUM 200 MG/2ML IV SOLN
INTRAVENOUS | Status: DC | PRN
  Administered 2023-09-05: 122.4 mg via INTRAVENOUS

## 2023-09-05 MED ORDER — STERILE WATER FOR IRRIGATION IR SOLN
Status: DC | PRN
  Administered 2023-09-05: 500 mL

## 2023-09-05 MED ORDER — ONDANSETRON HCL 4 MG/2ML IJ SOLN: 4.0000 mg | Freq: Once | INTRAMUSCULAR | Status: DC | PRN

## 2023-09-05 MED ORDER — DEXAMETHASONE SODIUM PHOSPHATE 10 MG/ML IJ SOLN
INTRAMUSCULAR | Status: DC | PRN
  Administered 2023-09-05: 10 mg via INTRAVENOUS

## 2023-09-05 MED ORDER — BUPIVACAINE HCL (PF) 0.25 % IJ SOLN
INTRAMUSCULAR | Status: AC
  Filled 2023-09-05: qty 60

## 2023-09-05 MED ORDER — LIDOCAINE 2% (20 MG/ML) 5 ML SYRINGE
INTRAMUSCULAR | Status: DC | PRN
  Administered 2023-09-05: 60 mg via INTRAVENOUS

## 2023-09-05 MED ORDER — LACTATED RINGERS IV SOLN: INTRAVENOUS | Status: DC | PRN

## 2023-09-05 MED ORDER — ONDANSETRON HCL 4 MG/2ML IJ SOLN
INTRAMUSCULAR | Status: DC | PRN
  Administered 2023-09-05: 4 mg via INTRAVENOUS

## 2023-09-05 MED ORDER — MIDAZOLAM HCL 2 MG/2ML IJ SOLN
INTRAMUSCULAR | Status: AC
  Filled 2023-09-05: qty 2

## 2023-09-05 MED ORDER — HYDROMORPHONE HCL 1 MG/ML IJ SOLN
INTRAMUSCULAR | Status: AC
  Filled 2023-09-05: qty 0.5

## 2023-09-05 MED ORDER — CHLORHEXIDINE GLUCONATE 0.12 % MT SOLN
OROMUCOSAL | Status: AC
  Filled 2023-09-05: qty 15

## 2023-09-05 MED ORDER — FENTANYL CITRATE (PF) 250 MCG/5ML IJ SOLN
INTRAMUSCULAR | Status: DC | PRN
  Administered 2023-09-05: 100 ug via INTRAVENOUS
  Administered 2023-09-05 (×6): 25 ug via INTRAVENOUS

## 2023-09-05 MED ORDER — OXYCODONE-ACETAMINOPHEN 7.5-325 MG PO TABS
1.0000 | ORAL_TABLET | Freq: Four times a day (QID) | ORAL | 0 refills | Status: DC | PRN
Start: 2023-09-05 — End: 2023-10-16

## 2023-09-05 MED ORDER — HYDROMORPHONE HCL 1 MG/ML IJ SOLN
INTRAMUSCULAR | Status: DC | PRN
Start: 2023-09-05 — End: 2023-09-05
  Administered 2023-09-05: .5 mg via INTRAVENOUS

## 2023-09-05 MED ORDER — ROCURONIUM BROMIDE 10 MG/ML (PF) SYRINGE
PREFILLED_SYRINGE | INTRAVENOUS | Status: DC | PRN
  Administered 2023-09-05 (×3): 50 mg via INTRAVENOUS

## 2023-09-05 MED ORDER — POTASSIUM CHLORIDE CRYS ER 20 MEQ PO TBCR: 20.0000 meq | EXTENDED_RELEASE_TABLET | Freq: Every day | ORAL | 0 refills | Status: DC

## 2023-09-05 MED ORDER — PROPOFOL 10 MG/ML IV BOLUS
INTRAVENOUS | Status: AC
  Filled 2023-09-05: qty 20

## 2023-09-05 MED ORDER — ONDANSETRON 8 MG PO TBDP: 8.0000 mg | ORAL_TABLET | Freq: Three times a day (TID) | ORAL | 0 refills | Status: DC | PRN

## 2023-09-05 MED ORDER — FENTANYL CITRATE (PF) 250 MCG/5ML IJ SOLN
INTRAMUSCULAR | Status: AC
  Filled 2023-09-05: qty 5

## 2023-09-05 MED ORDER — LIDOCAINE HCL (PF) 2 % IJ SOLN
INTRAMUSCULAR | Status: AC
  Filled 2023-09-05: qty 5

## 2023-09-05 MED ORDER — PHENYLEPHRINE HCL-NACL 20-0.9 MG/250ML-% IV SOLN
INTRAVENOUS | Status: DC | PRN
  Administered 2023-09-05 (×2): 80 ug via INTRAVENOUS

## 2023-09-05 MED ORDER — DEXAMETHASONE SODIUM PHOSPHATE 10 MG/ML IJ SOLN
INTRAMUSCULAR | Status: AC
  Filled 2023-09-05: qty 1

## 2023-09-05 MED ORDER — MIDAZOLAM HCL 5 MG/5ML IJ SOLN
INTRAMUSCULAR | Status: DC | PRN
  Administered 2023-09-05: 2 mg via INTRAVENOUS

## 2023-09-05 MED ORDER — PROPOFOL 10 MG/ML IV BOLUS
INTRAVENOUS | Status: DC | PRN
  Administered 2023-09-05: 150 mg via INTRAVENOUS

## 2023-09-05 MED ORDER — KETOROLAC TROMETHAMINE 10 MG PO TABS: 10.0000 mg | ORAL_TABLET | Freq: Three times a day (TID) | ORAL | 0 refills | Status: DC | PRN

## 2023-09-05 MED ORDER — CIPROFLOXACIN HCL 500 MG PO TABS: 500.0000 mg | ORAL_TABLET | Freq: Two times a day (BID) | ORAL | 0 refills | Status: DC

## 2023-09-05 MED ORDER — POVIDONE-IODINE 10 % EX SWAB
2.0000 | Freq: Once | CUTANEOUS | Status: AC
  Administered 2023-09-05: 2 via TOPICAL

## 2023-09-05 MED ORDER — KETOROLAC TROMETHAMINE 30 MG/ML IJ SOLN
30.0000 mg | Freq: Once | INTRAMUSCULAR | Status: AC
  Administered 2023-09-05: 30 mg via INTRAVENOUS
  Filled 2023-09-05: qty 1

## 2023-09-05 MED ORDER — CEFAZOLIN SODIUM-DEXTROSE 2-4 GM/100ML-% IV SOLN
2.0000 g | INTRAVENOUS | Status: AC
  Administered 2023-09-05: 2 g via INTRAVENOUS
  Filled 2023-09-05: qty 100

## 2023-09-05 MED ORDER — PHENYLEPHRINE 80 MCG/ML (10ML) SYRINGE FOR IV PUSH (FOR BLOOD PRESSURE SUPPORT)
PREFILLED_SYRINGE | INTRAVENOUS | Status: AC
  Filled 2023-09-05: qty 10

## 2023-09-05 MED ORDER — ACETAMINOPHEN 10 MG/ML IV SOLN
INTRAVENOUS | Status: DC | PRN
Start: 2023-09-05 — End: 2023-09-05
  Administered 2023-09-05: 1000 mg via INTRAVENOUS

## 2023-09-05 MED ORDER — ACETAMINOPHEN 10 MG/ML IV SOLN
INTRAVENOUS | Status: AC
  Filled 2023-09-05: qty 100

## 2023-09-05 SURGICAL SUPPLY — 59 items
ADH SKN CLS APL DERMABOND .7 (GAUZE/BANDAGES/DRESSINGS) ×1
ANTIFOG SOL W/FOAM PAD STRL (MISCELLANEOUS) ×1
BLADE SURG SZ10 CARB STEEL (BLADE) IMPLANT
BLADE SURG SZ11 CARB STEEL (BLADE) ×1 IMPLANT
CAUTERY HOOK MNPLR 1.6 DVNC XI (INSTRUMENTS) ×1 IMPLANT
COVER LIGHT HANDLE STERIS (MISCELLANEOUS) ×2 IMPLANT
COVER MAYO STAND XLG (MISCELLANEOUS) ×1 IMPLANT
DERMABOND ADVANCED .7 DNX12 (GAUZE/BANDAGES/DRESSINGS) ×1 IMPLANT
DRAPE ARM DVNC X/XI (DISPOSABLE) ×4 IMPLANT
DRAPE COLUMN DVNC XI (DISPOSABLE) ×1 IMPLANT
DRIVER NDL MEGA SUTCUT DVNCXI (INSTRUMENTS) ×1 IMPLANT
DRIVER NDLE MEGA SUTCUT DVNCXI (INSTRUMENTS) ×1 IMPLANT
ELECT REM PT RETURN 9FT ADLT (ELECTROSURGICAL) ×1
ELECTRODE REM PT RTRN 9FT ADLT (ELECTROSURGICAL) ×1 IMPLANT
FORCEPS BPLR R/ABLATION 8 DVNC (INSTRUMENTS) IMPLANT
FORCEPS PROGRASP DVNC XI (FORCEP) ×1 IMPLANT
GAUZE 4X4 16PLY ~~LOC~~+RFID DBL (SPONGE) ×2 IMPLANT
GLOVE BIO SURGEON STRL SZ7 (GLOVE) IMPLANT
GLOVE BIOGEL PI IND STRL 7.0 (GLOVE) ×4 IMPLANT
GLOVE BIOGEL PI IND STRL 7.5 (GLOVE) IMPLANT
GLOVE BIOGEL PI IND STRL 8 (GLOVE) ×2 IMPLANT
GLOVE ECLIPSE 8.0 STRL XLNG CF (GLOVE) ×3 IMPLANT
GLOVE SURG SS PI 7.5 STRL IVOR (GLOVE) IMPLANT
GOWN STRL REUS W/TWL LRG LVL3 (GOWN DISPOSABLE) ×2 IMPLANT
GOWN STRL REUS W/TWL XL LVL3 (GOWN DISPOSABLE) ×2 IMPLANT
GYRUS RUMI II 4.0CM BLUE (DISPOSABLE) ×1
KIT PINK PAD W/HEAD ARE REST (MISCELLANEOUS) ×1
KIT PINK PAD W/HEAD ARM REST (MISCELLANEOUS) ×1 IMPLANT
KIT TURNOVER CYSTO (KITS) ×1 IMPLANT
MANIFOLD NEPTUNE II (INSTRUMENTS) ×1 IMPLANT
NDL HYPO 21X1.5 SAFETY (NEEDLE) ×1 IMPLANT
NDL INSUFFLATION 14GA 120MM (NEEDLE) ×1 IMPLANT
NEEDLE HYPO 21X1.5 SAFETY (NEEDLE) ×1 IMPLANT
NEEDLE INSUFFLATION 14GA 120MM (NEEDLE) ×1 IMPLANT
OBTURATOR OPTICAL STND 8 DVNC (TROCAR) ×1
OBTURATOR OPTICALSTD 8 DVNC (TROCAR) ×1 IMPLANT
PACK PERI GYN (CUSTOM PROCEDURE TRAY) ×1 IMPLANT
RUMI II GYRUS 4.0CM BLUE (DISPOSABLE) IMPLANT
SCISSORS MNPLR CVD DVNC XI (INSTRUMENTS) IMPLANT
SEAL UNIV 5-12 XI (MISCELLANEOUS) ×3 IMPLANT
SEALER VESSEL EXT DVNC XI (MISCELLANEOUS) ×1 IMPLANT
SET BASIN LINEN APH (SET/KITS/TRAYS/PACK) ×1 IMPLANT
SET IRRIG TUBING LAPAROSCOPIC (IRRIGATION / IRRIGATOR) ×1 IMPLANT
SET TUBE DA VINCI INSUFFLATOR (TUBING) IMPLANT
SOLUTION ANTFG W/FOAM PAD STRL (MISCELLANEOUS) ×1 IMPLANT
SPONGE T-LAP 18X18 ~~LOC~~+RFID (SPONGE) ×1 IMPLANT
SUT STRATAFIX 0 PDS+ CT-2 23 (SUTURE) ×1
SUT VICRYL 0 AB UR-6 (SUTURE) ×1 IMPLANT
SUT VICRYL AB 3-0 FS1 BRD 27IN (SUTURE) ×1 IMPLANT
SUTURE STRATFX 0 PDS+ CT-2 23 (SUTURE) ×1 IMPLANT
SYR 10ML LL (SYRINGE) ×2 IMPLANT
SYR 50ML LL SCALE MARK (SYRINGE) ×2 IMPLANT
SYR CONTROL 10ML LL (SYRINGE) ×2 IMPLANT
TIP RUMI ORANGE 6.7MMX12CM (TIP) ×1 IMPLANT
TIP UTERINE 6.7X10CM GRN DISP (MISCELLANEOUS) IMPLANT
TRAY FOL W/BAG SLVR 16FR STRL (SET/KITS/TRAYS/PACK) ×1 IMPLANT
TRAY FOLEY W/BAG SLVR 16FR LF (SET/KITS/TRAYS/PACK) ×1
TROCAR KII 8X100ML NONTHREADED (TROCAR) ×1 IMPLANT
WATER STERILE IRR 500ML POUR (IV SOLUTION) ×1 IMPLANT

## 2023-09-05 NOTE — Op Note (Signed)
Preoperative diagnosis: Fibroid uterus 610 cc Menorrhagia, regular cycle Dyspareunia Pelvic pain, uterus is trapped in the pelvic bowl   Postoperative diagnosis: SAA   Procedure: Xi Robotic hysterectomy with bilateral salpingectomy  Laparoscopic guided transversus abdominus plane block   Surgeon: Lazaro Arms, MD   Anesthesia: General endotracheal   Findings: Normal findings except enlarged uterus   Description of operation: Patient was taken to the operating room and placed in the low lithotomy position She was prepped and draped in the usual sterile fashion robotic assisted laparoscopic procedure A Foley catheter was placed The vagina was once again prepped additionally   A RUMI II 10 cm with 4 cervical cup was placed for uterine manipulation and colpotomy delineation   An incision was made above the umbilicus The umbilical fascia was grasped A varies needle was used and placed into the peritoneum with 1 pass A pneumoperitoneum was created to a pressure of 15   An 8 mm port was placed into the peritoneal cavity using a nonbladed trocar easily with 1 pass using the video laparoscope The peritoneal cavity was confirmed   There were no unusual findings    4 additional 8 mm ports were placed at approximately the same level as the supraumbilical port 3 of the ports were robotic and 1 is an assist port   One was left lateral and 1 was placed right lateral, both lateral to the rectus anterior muscle These were placed under direct visualization without difficulty into the peritoneal cavity Nonbladed trocars were used in all instances    The patient was placed in 24 degrees of Trendelenburg   The [employer] robot was then docked to the 3 robotic ports   Instruments used during the robotic hysterectomy: Vessel sealer extender using bipolar energy ProGrasp forceps with no energy Monopolar hook Megacut needle driver 2-0 PDS symmetrical STRATAFIX on a CT 2 needle    I then left the patient and went to the surgical console   The RUMI II  was used throughout the case to apply traction and anterior/posterior displacement of the uterus to facilitate the robotic procedure The ProGrasp was used to put traction on the left adnexa The left ureter was identified and found to be well away from the adnexal vessels The vessel sealer extender using bipolar energy was used and the utero-ovarian ligament was coagulated and then transected I used traction medially and anteriorly and used the vessel sealer to take the broad ligament and round ligament down to the level of the cervical isthmus just lateral to the left uterine vessels   I then turned my attention to the right adnexa The pro grasp was used and medial and upward traction was placed The vessel sealer extender using bipolar energy was used to coagulate and ligate the right infundibulopelvic ligament vessels Again the right ureter was well inferior to the vessels I continued use medial and anterior retraction using the ProGrasp and the vessel sealer with the bipolar energy was used to take the broad ligament and round ligament down to the level of the cervical isthmus and lateral to the uterine vessels   I then placed the monopolar hook in the place of the ProGrasp The vessel sealer extender was used to grasp the peritoneum of the bladder provide traction, of course no energy was used for this I used the monopolar hook with energy to open of the peritoneal leaf anteriorly and dissect the bladder peritoneum off the lower uterine segment and cervix beyond the  level of the vaginal colpotomy cup I then used the monopolar hook to take the peritoneum down where I stopped using the vessel sealer and met in the bladder dissection bilaterally   The vessel sealer extender with monopolar energy was used to coagulate the uterine vessels at the level of the cervical isthmus bilaterally and then just above and just below to  manage backbleeding The uterine vessels were transected There was good hemostasis I then stayed medial to this uterine vessel pedicle with the vessel sealer extender and took down the paracervical tissue to allow colpotomy incision using the monopolar hook, preserving the cardinal ligament Again there was good hemostasis bilaterally   I then used the monopolar hook and made a posterior colpotomy incision above the level of insertion of the uterosacral ligaments I used a frowny face then smiley face technique and opened the vagina to approximately 8:00 and 4:00 posteriorly I then used the vessel sealer extender with bipolar energy, coagulating and then transecting the vagina   The monopolar hook was used to make the anterior colpotomy incision as the bladder had been dissected well past the colpotomy ring Cephalad tension was placed on the Vcare handle throughout this portion of the procedure to prevent thermal injury to the bladder and safe distance from the ureters bilaterally Colpotomy incision was made from 10:00 to 2:00 The vessel sealer with bipolar energy was then used to coagulate and transect the vagina, again inside of the cardinal ligament   The uterus was removed through the vagina   The tubes were also removed through the vagina A wet lap tape was placed in the vagina to maintain pneumoperitoneum   All pedicles were found to be hemostatic there was very little blood loss I then removed the vessel sealer and monopolar hook The pro grasp was replaced and the needle driver was also placed along with the suture   The vagina was closed using the 2-0 PDS STRATAFIX symmetrical suture on the CT 2 needle I pay close attention to the vaginal corners bilaterally and incorporated those in the closure 1.5 cm of vagina was operating to the vaginal closure I also fixed the uterosacral ligaments to the vaginal cuff repair shortening the uterosacral ligaments thus providing improved vaginal  vault suspension The cardinal ligaments were intact again improving postoperative vaginal support Pubocervical rectovaginal and posterior peritoneum were all included in the vaginal closure There was good result hemostasis   At the end of the procedure all the pedicles were identified and found to be hemostatic Both ureters were identified and undergoing normal peristalsis, they were well out of the way of surgical field throughout   I then got up from the console and went back to the side of the patient after gowning and gloving sterilely  A transversus abdominus plane block was placed using laparoscopic guidance at T10 and T7 bilaterally, 10 cc of 0.25% bupivacaine at each site, 40 cc total of 100 mg of bupivacaine.  Doyle's bubble technique was used.       The 5 robotic ports were removed The insufflation ports were used to actively desufflate the peritoneal cavity to a pressure of 0 Ports were then removed   The subcutaneous tissue of all 5 incisions was closed using 0 Vicryl All 5 skin incisions were closed using 3-0 vicryl in a subcuticular manner Dermabond was used all 5 incision sites  Each incision was dressed   The patient tolerated the procedure well She received 2 g of Ancef and 30  mg of Toradol preoperatively   EBL: 10 cc   Lazaro Arms, MD 09/05/2023 11:07 AM

## 2023-09-05 NOTE — Anesthesia Procedure Notes (Signed)

## 2023-09-05 NOTE — H&P (Signed)
Preoperative History and Physical  Lindsay Mccoy is a 37 y.o. [id number] with Patient's last menstrual period was 07/22/2023 (approximate). admitted for a  RA TLH + BS .   Pt is seen as referral from [facility] for pelvic mass Unfortunately her scans from her [facility] are not on Care everywhere But she did show me on her cell phone But I think there is a typo with her sonogram report If I use 13.2 x 7.3 x12.1 x 0.5233=610 cc   Her exam today confirms a fibroid filling her bony pelvis with a very retroverted and retroflexed uterus, it is completely filling the pelvis and I can push it out with some effort   She is having heavy bleeding first 3 days of bleeding She has also started to have dysparuenia  She has had acute pelvic pressure issues   I think the uterus has just now gotten to the size of filling the pelvis completely causing acute symptoms     PMH:    Past Medical History:  Diagnosis Date   Bronchitis    Gastritis    GERD (gastroesophageal reflux disease)    Scoliosis     PSH:     Past Surgical History:  Procedure Laterality Date   UPPER GI ENDOSCOPY      POb/GynH:      OB History     Gravida  2   Para  2   Term  2   Preterm      AB      Living  2      SAB      IAB      Ectopic      Multiple      Live Births              SH:   Social History   Tobacco Use   Smoking status: Former    Current packs/day: 0.00    Average packs/day: 0.5 packs/day for 20.0 years (10.0 ttl pk-yrs)    Types: Cigarettes    Start date: 10/06/1995    Quit date: 10/06/2015    Years since quitting: 7.9   Smokeless tobacco: Never  Vaping Use   Vaping status: Every Day  Substance Use Topics   Alcohol use: Yes    Comment: occassionally    Drug use: Never    FH:    Family History  Problem Relation Age of Onset   Scoliosis Mother    Asthma Mother    Breast cancer Maternal Aunt    Breast cancer Maternal Grandmother      Allergies: No  Known Allergies  Medications:       Current Facility-Administered Medications:    ceFAZolin (ANCEF) IVPB 2g/100 mL premix, 2 g, Intravenous, On Call to OR, Lazaro Arms, MD   chlorhexidine (PERIDEX) 0.12 % solution, , , ,   Review of Systems:   Review of Systems  Constitutional: Negative for fever, chills, weight loss, malaise/fatigue and diaphoresis.  HENT: Negative for hearing loss, ear pain, nosebleeds, congestion, sore throat, neck pain, tinnitus and ear discharge.   Eyes: Negative for blurred vision, double vision, photophobia, pain, discharge and redness.  Respiratory: Negative for cough, hemoptysis, sputum production, shortness of breath, wheezing and stridor.   Cardiovascular: Negative for chest pain, palpitations, orthopnea, claudication, leg swelling and PND.  Gastrointestinal: Positive for abdominal pain. Negative for heartburn, nausea, vomiting, diarrhea, constipation, blood in stool and melena.  Genitourinary: Negative for dysuria, urgency, frequency, hematuria and flank  pain.  Musculoskeletal: Negative for myalgias, back pain, joint pain and falls.  Skin: Negative for itching and rash.  Neurological: Negative for dizziness, tingling, tremors, sensory change, speech change, focal weakness, seizures, loss of consciousness, weakness and headaches.  Endo/Heme/Allergies: Negative for environmental allergies and polydipsia. Does not bruise/bleed easily.  Psychiatric/Behavioral: Negative for depression, suicidal ideas, hallucinations, memory loss and substance abuse. The patient is not nervous/anxious and does not have insomnia.      PHYSICAL EXAM:  Blood pressure 125/84, pulse 75, temperature 98.3 F (36.8 C), temperature source Oral, resp. rate 16, height 5\' 5"  (1.651 m), weight 61.2 kg, last menstrual period 07/22/2023, SpO2 100%.    Vitals reviewed. Constitutional: She is oriented to person, place, and time. She appears well-developed and well-nourished.  HENT:  Head:  Normocephalic and atraumatic.  Right Ear: External ear normal.  Left Ear: External ear normal.  Nose: Nose normal.  Mouth/Throat: Oropharynx is clear and moist.  Eyes: Conjunctivae and EOM are normal. Pupils are equal, round, and reactive to light. Right eye exhibits no discharge. Left eye exhibits no discharge. No scleral icterus.  Neck: Normal range of motion. Neck supple. No tracheal deviation present. No thyromegaly present.  Cardiovascular: Normal rate, regular rhythm, normal heart sounds and intact distal pulses.  Exam reveals no gallop and no friction rub.   No murmur heard. Respiratory: Effort normal and breath sounds normal. No respiratory distress. She has no wheezes. She has no rales. She exhibits no tenderness.  GI: Soft. Bowel sounds are normal. She exhibits no distension and no mass. There is tenderness. There is no rebound and no guarding.  Genitourinary:       Vulva is normal without lesions Vagina is pink moist without discharge Cervix normal in appearance and pap is normal Uterus is uterus retroverted + retroflexed (C shape) with fibroid uterus filling the pelvis completely does not really move out of the pelvis at all  Adnexa is negative with normal sized ovaries by sonogram  Musculoskeletal: Normal range of motion. She exhibits no edema and no tenderness.  Neurological: She is alert and oriented to person, place, and time. She has normal reflexes. She displays normal reflexes. No cranial nerve deficit. She exhibits normal muscle tone. Coordination normal.  Skin: Skin is warm and dry. No rash noted. No erythema. No pallor.  Psychiatric: She has a normal mood and affect. Her behavior is normal. Judgment and thought content normal.    Labs: Results for orders placed or performed during the hospital encounter of 09/04/23 (from the past 336 hour(s))  Type and screen [facility]   Collection Time: 09/04/23 12:33 PM  Result Value Ref Range   ABO/RH(D) A NEG     Antibody Screen NEG    Sample Expiration      09/07/2023,2359 Performed at [facility], [address] [place], [place] [address]   Results for orders placed or performed during the hospital encounter of 09/03/23 (from the past 336 hour(s))  Urinalysis, Routine w reflex microscopic -Urine, Clean Catch   Collection Time: 09/03/23  2:40 PM  Result Value Ref Range   Color, Urine STRAW (A) YELLOW   APPearance HAZY (A) CLEAR   Specific Gravity, Urine <1.005 (L) 1.005 - 1.030   pH 6.0 5.0 - 8.0   Glucose, UA NEGATIVE NEGATIVE mg/dL   Hgb urine dipstick TRACE (A) NEGATIVE   Bilirubin Urine NEGATIVE NEGATIVE   Ketones, ur NEGATIVE NEGATIVE mg/dL   Protein, ur NEGATIVE NEGATIVE mg/dL   Nitrite NEGATIVE  NEGATIVE   Leukocytes,Ua MODERATE (A) NEGATIVE  Pregnancy, urine   Collection Time: 09/03/23  2:40 PM  Result Value Ref Range   Preg Test, Ur NEGATIVE NEGATIVE  Urinalysis, Microscopic (reflex)   Collection Time: 09/03/23  2:40 PM  Result Value Ref Range   RBC / HPF 0-5 0 - 5 RBC/hpf   WBC, UA 0-5 0 - 5 WBC/hpf   Bacteria, UA FEW (A) NONE SEEN   Squamous Epithelial / HPF >50 0 - 5 /HPF   Budding Yeast PRESENT   CBC   Collection Time: 09/03/23  2:59 PM  Result Value Ref Range   WBC 6.9 4.0 - 10.5 K/uL   RBC 4.14 3.87 - 5.11 MIL/uL   Hemoglobin 13.0 12.0 - 15.0 g/dL   HCT 16.1 09.6 - 04.5 %   MCV 92.8 80.0 - 100.0 fL   MCH 31.4 26.0 - 34.0 pg   MCHC 33.9 30.0 - 36.0 g/dL   RDW 40.9 81.1 - 91.4 %   Platelets 255 150 - 400 K/uL   nRBC 0.0 0.0 - 0.2 %  Comprehensive metabolic panel   Collection Time: 09/03/23  2:59 PM  Result Value Ref Range   Sodium 135 135 - 145 mmol/L   Potassium 3.1 (L) 3.5 - 5.1 mmol/L   Chloride 106 98 - 111 mmol/L   CO2 22 22 - 32 mmol/L   Glucose, Bld 88 70 - 99 mg/dL   BUN 11 6 - 20 mg/dL   Creatinine, Ser 7.82 0.44 - 1.00 mg/dL   Calcium 8.3 (L) 8.9 - 10.3 mg/dL   Total Protein 6.9 6.5 - 8.1 g/dL   Albumin 3.9 3.5 - 5.0 g/dL   AST 15 15 - 41 U/L    ALT 10 0 - 44 U/L   Alkaline Phosphatase 44 38 - 126 U/L   Total Bilirubin 0.7 0.3 - 1.2 mg/dL   GFR, Estimated >95 >62 mL/min   Anion gap 7 5 - 15  Rapid HIV screen (HIV 1/2 Ab+Ag)   Collection Time: 09/03/23  2:59 PM  Result Value Ref Range   HIV-1 P24 Antigen - HIV24 NON REACTIVE NON REACTIVE   HIV 1/2 Antibodies NON REACTIVE NON REACTIVE   Interpretation (HIV Ag Ab)      A non reactive test result means that HIV 1 or HIV 2 antibodies and HIV 1 p24 antigen were not detected in the specimen.    EKG: Orders placed or performed in visit on 01/03/23   EKG 12-Lead    Imaging Studies: No results found.    Assessment: Fibroid uterus, 610 cc Menorrhagia with regular cycle Dyspareunia Pelvic pain  Plan: RA TLH + BS  Pt understands the risks of surgery including but not limited t  excessive bleeding requiring transfusion or reoperation, post-operative infection requiring prolonged hospitalization or re-hospitalization and antibiotic therapy, and damage to other organs including bladder, bowel, ureters and major vessels.  The patient also understands the alternative treatment options which were discussed in full.  All questions were answered.  Lazaro Arms 09/05/2023 7:21 AM

## 2023-09-05 NOTE — Transfer of Care (Signed)
Immediate Anesthesia Transfer of Care Note  Patient: Lindsay Mccoy  Procedure(s) Performed: XI ROBOTIC ASSISTED TOTAL HYSTERECTOMY WITH BILATERAL SALPINGECTOMY (Bilateral: Abdomen)  Patient Location: PACU  Anesthesia Type:General  Level of Consciousness: awake, alert , and oriented  Airway & Oxygen Therapy: Patient Spontanous Breathing and Patient connected to nasal cannula oxygen  Post-op Assessment: Report given to RN, Post -op Vital signs reviewed and stable, Patient moving all extremities X 4, and Patient able to stick tongue midline  Post vital signs: Reviewed and stable  Last Vitals:  Vitals Value Taken Time  BP 123/61 09/05/23 1048  Temp 98.6   Pulse 85 09/05/23 1053  Resp 7 09/05/23 1053  SpO2 99 % 09/05/23 1053  Vitals shown include unfiled device data.  Last Pain:  Vitals:   09/05/23 0633  TempSrc: Oral  PainSc: 0-No pain         Complications: No notable events documented.

## 2023-09-05 NOTE — Anesthesia Preprocedure Evaluation (Signed)
Anesthesia Evaluation  Patient identified by MRN, date of birth, ID band Patient awake    Reviewed: Allergy & Precautions, H&P , NPO status , Patient's Chart, lab work & pertinent test results, reviewed documented beta blocker date and time   Airway Mallampati: II  TM Distance: >3 FB Neck ROM: full    Dental no notable dental hx.    Pulmonary neg pulmonary ROS, asthma , Patient abstained from smoking., former smoker   Pulmonary exam normal breath sounds clear to auscultation       Cardiovascular Exercise Tolerance: Good negative cardio ROS  Rhythm:regular Rate:Normal     Neuro/Psych  PSYCHIATRIC DISORDERS  Depression    negative neurological ROS  negative psych ROS   GI/Hepatic negative GI ROS, Neg liver ROS,GERD  ,,  Endo/Other  negative endocrine ROS    Renal/GU negative Renal ROS  negative genitourinary   Musculoskeletal   Abdominal   Peds  Hematology negative hematology ROS (+)   Anesthesia Other Findings   Reproductive/Obstetrics negative OB ROS                             Anesthesia Physical Anesthesia Plan  ASA: 2  Anesthesia Plan: General and General ETT   Post-op Pain Management:    Induction:   PONV Risk Score and Plan: Ondansetron and Scopolamine patch - Pre-op  Airway Management Planned:   Additional Equipment:   Intra-op Plan:   Post-operative Plan:   Informed Consent: I have reviewed the patients History and Physical, chart, labs and discussed the procedure including the risks, benefits and alternatives for the proposed anesthesia with the patient or authorized representative who has indicated his/her understanding and acceptance.     Dental Advisory Given  Plan Discussed with: CRNA  Anesthesia Plan Comments:        Anesthesia Quick Evaluation

## 2023-09-06 ENCOUNTER — Other Ambulatory Visit: Payer: Self-pay

## 2023-09-06 ENCOUNTER — Encounter (HOSPITAL_COMMUNITY): Payer: Self-pay

## 2023-09-06 ENCOUNTER — Emergency Department (HOSPITAL_COMMUNITY)
Admission: EM | Admit: 2023-09-06 | Discharge: 2023-09-07 | Disposition: A | Discharge status: Home / Self Care | Payer: Managed Care, Other (non HMO) | Attending: Emergency Medicine | Admitting: Emergency Medicine

## 2023-09-06 DIAGNOSIS — R110 Nausea: Secondary | ICD-10-CM | POA: Diagnosis not present

## 2023-09-06 DIAGNOSIS — R300 Dysuria: Secondary | ICD-10-CM | POA: Diagnosis not present

## 2023-09-06 LAB — COMPREHENSIVE METABOLIC PANEL
ALT: 13 U/L (ref 0–44)
AST: 17 U/L (ref 15–41)
Albumin: 3.9 g/dL (ref 3.5–5.0)
Alkaline Phosphatase: 47 U/L (ref 38–126)
Anion gap: 9 (ref 5–15)
BUN: 13 mg/dL (ref 6–20)
CO2: 24 mmol/L (ref 22–32)
Calcium: 8.5 mg/dL — ABNORMAL LOW (ref 8.9–10.3)
Chloride: 102 mmol/L (ref 98–111)
Creatinine, Ser: 0.93 mg/dL (ref 0.44–1.00)
GFR, Estimated: >60 mL/min (ref >60)
Glucose, Bld: 98 mg/dL (ref 70–99)
Potassium: 4.1 mmol/L (ref 3.5–5.1)
Sodium: 135 mmol/L (ref 135–145)
Total Bilirubin: 0.6 mg/dL (ref 0.3–1.2)
Total Protein: 7.1 g/dL (ref 6.5–8.1)

## 2023-09-06 LAB — CBC
HCT: 39 % (ref 36.0–46.0)
Hemoglobin: 12.9 g/dL (ref 12.0–15.0)
MCH: 31.9 pg (ref 26.0–34.0)
MCHC: 33.1 g/dL (ref 30.0–36.0)
MCV: 96.5 fL (ref 80.0–100.0)
Platelets: 220 10*3/uL (ref 150–400)
RBC: 4.04 MIL/uL (ref 3.87–5.11)
RDW: 13.2 % (ref 11.5–15.5)
WBC: 7.9 10*3/uL (ref 4.0–10.5)
nRBC: 0 % (ref 0.0–0.2)

## 2023-09-06 LAB — LIPASE, BLOOD: Lipase: 39 U/L (ref 11–51)

## 2023-09-06 NOTE — Progress Notes (Addendum)
Patient called 09/06/2023 with question regarding dressing on port sites assured patient she could get in shower dermabond seals the port sites

## 2023-09-06 NOTE — ED Triage Notes (Signed)
Pt reports she had a hysterectomy done at this facility and is still having nausea despite using zofran, bleeding and dysuria, dizziness.

## 2023-09-06 NOTE — Anesthesia Postprocedure Evaluation (Signed)
Anesthesia Post Note  Patient: Lindsay Mccoy  Procedure(s) Performed: XI ROBOTIC ASSISTED TOTAL HYSTERECTOMY WITH BILATERAL SALPINGECTOMY (Bilateral: Abdomen)  Patient location during evaluation: Phase II Anesthesia Type: General Level of consciousness: awake Pain management: pain level controlled Vital Signs Assessment: post-procedure vital signs reviewed and stable Respiratory status: spontaneous breathing and respiratory function stable Cardiovascular status: blood pressure returned to baseline and stable Postop Assessment: no headache and no apparent nausea or vomiting Anesthetic complications: no Comments: Late entry   No notable events documented.   Last Vitals:  Vitals:   09/05/23 1145 09/05/23 1203  BP: 123/75 124/68  Pulse: 82 93  Resp: 14 16  Temp:  37.1 C  SpO2: 98% 98%    Last Pain:  Vitals:   09/05/23 1203  TempSrc: Oral  PainSc: 2                  Windell Norfolk

## 2023-09-07 ENCOUNTER — Encounter (HOSPITAL_COMMUNITY): Payer: Self-pay | Admitting: Obstetrics & Gynecology

## 2023-09-07 LAB — URINALYSIS, ROUTINE W REFLEX MICROSCOPIC
Bilirubin Urine: NEGATIVE
Glucose, UA: NEGATIVE mg/dL
Ketones, ur: NEGATIVE mg/dL
Nitrite: NEGATIVE
Protein, ur: 30 mg/dL — AB
RBC / HPF: >50 RBC/hpf (ref 0–5)
Specific Gravity, Urine: 1.018 (ref 1.005–1.030)
pH: 5 (ref 5.0–8.0)

## 2023-09-07 LAB — SURGICAL PATHOLOGY

## 2023-09-07 MED ORDER — PROCHLORPERAZINE MALEATE 10 MG PO TABS
10.0000 mg | ORAL_TABLET | Freq: Four times a day (QID) | ORAL | Status: DC | PRN
  Administered 2023-09-07: 10 mg via ORAL
  Filled 2023-09-07 (×2): qty 1

## 2023-09-07 MED ORDER — CEPHALEXIN 500 MG PO CAPS
1000.0000 mg | ORAL_CAPSULE | Freq: Once | ORAL | Status: AC
  Administered 2023-09-07: 1000 mg via ORAL
  Filled 2023-09-07: qty 2

## 2023-09-07 MED ORDER — PROCHLORPERAZINE MALEATE 10 MG PO TABS: 10.0000 mg | ORAL_TABLET | Freq: Four times a day (QID) | ORAL | 0 refills | Status: DC | PRN

## 2023-09-07 NOTE — ED Provider Notes (Signed)
[place] EMERGENCY DEPARTMENT AT [facility] Provider Note   CSN: [id number] Arrival date & time: 09/06/23  2205     History  Chief Complaint  Patient presents with   Post-op Problem    Lindsay Mccoy is a 37 y.o. female.  The history is provided by the patient.  She had robotic assisted hysterectomy done earlier today, and is complaining of nausea as well as dysuria.  There has been some slight spotting.  She states she called her physician who recommended that she come to the ED.  She denies fever or chills.  She has used ondansetron without relief of nausea.   Home Medications Prior to Admission medications   Medication Sig Start Date End Date Taking? Authorizing Provider  acetaminophen (TYLENOL) 500 MG tablet Take 1,000 mg by mouth every 6 (six) hours as needed for moderate pain.    [provider]  celecoxib (CELEBREX) 100 MG capsule Take 100 mg by mouth 2 (two) times daily. 07/11/23   [provider]  ciprofloxacin (CIPRO) 500 MG tablet Take 1 tablet (500 mg total) by mouth 2 (two) times daily. 09/05/23   Lazaro Arms, MD  fexofenadine (ALLEGRA) 180 MG tablet Take 180 mg by mouth daily.    [provider]  ketorolac (TORADOL) 10 MG tablet Take 1 tablet (10 mg total) by mouth every 8 (eight) hours as needed. 09/05/23   Lazaro Arms, MD  lisdexamfetamine (VYVANSE) 30 MG capsule Take 30 mg by mouth daily.    [provider]  loratadine (CLARITIN) 10 MG tablet Take 10 mg by mouth at bedtime.    [provider]  ondansetron (ZOFRAN-ODT) 8 MG disintegrating tablet Take 1 tablet (8 mg total) by mouth every 8 (eight) hours as needed for nausea or vomiting. 09/05/23   Lazaro Arms, MD  oxyCODONE-acetaminophen (PERCOCET) 7.5-325 MG tablet Take 1 tablet by mouth every 6 (six) hours as needed. 09/05/23   Lazaro Arms, MD  pantoprazole (PROTONIX) 40 MG tablet TAKE 1 TABLET BY MOUTH DAILY 05/21/23   Sonny Masters, FNP  potassium  chloride SA (KLOR-CON M) 20 MEQ tablet Take 1 tablet (20 mEq total) by mouth daily. 09/05/23   Lazaro Arms, MD  sertraline (ZOLOFT) 50 MG tablet Take 50 mg by mouth daily.    [provider]  sucralfate (CARAFATE) 1 g tablet Take 1 tablet (1 g total) by mouth 4 (four) times daily -  with meals and at bedtime. 08/16/23   Lazaro Arms, MD  omeprazole (PRILOSEC) 20 MG capsule Take 1 capsule (20 mg total) by mouth 2 (two) times daily before a meal. 12/15/20 02/02/21  Lorelee New, PA-C      Allergies    Patient has no known allergies.    Review of Systems   Review of Systems  All other systems reviewed and are negative.   Physical Exam Updated Vital Signs BP 99/61 (BP Location: Right Arm)   Pulse 70   Temp 98.8 F (37.1 C) (Oral)   Resp 14   Ht 5\' 5"  (1.651 m)   Wt 61.2 kg   LMP 07/22/2023 (Approximate)   SpO2 99%   BMI 22.45 kg/m  Physical Exam Vitals and nursing note reviewed.   37 year old female, resting comfortably and in no acute distress. Vital signs are normal. Oxygen saturation is 99%, which is normal. Head is normocephalic and atraumatic. PERRLA, EOMI. Oropharynx is clear.  Mucous membranes are moist. Lungs are clear  without rales, wheezes, or rhonchi. Chest is nontender. Heart has regular rate and rhythm without murmur. Abdomen is soft, flat, with minimal tenderness.  Scars from robotic assisted hysterectomy are present and healing appropriately without signs of infection. Extremities have no cyanosis or edema, Neurologic: Mental status is normal, moves all extremities equally.  ED Results / Procedures / Treatments   Labs (all labs ordered are listed, but only abnormal results are displayed) Labs Reviewed  COMPREHENSIVE METABOLIC PANEL - Abnormal; Notable for the following components:      Result Value   Calcium 8.5 (*)    All other components within normal limits  URINALYSIS, ROUTINE W REFLEX MICROSCOPIC - Abnormal; Notable for the following  components:   APPearance CLOUDY (*)    Hgb urine dipstick LARGE (*)    Protein, ur 30 (*)    Leukocytes,Ua LARGE (*)    Bacteria, UA RARE (*)    All other components within normal limits  LIPASE, BLOOD  CBC   Procedures Procedures    Medications Ordered in ED Medications - No data to display  ED Course/ Medical Decision Making/ A&P                                 Medical Decision Making Amount and/or Complexity of Data Reviewed Labs: ordered.  Risk Prescription drug management.   Postoperative nausea not responding to ondansetron.  I have ordered a dose of prochlorperazine.  Possible UTI, urinalysis is pending.  I have reviewed her other laboratory tests, and my interpretation is normal CBC and normal comprehensive metabolic panel.  I reviewed her past records showing robotic assisted hysterectomy done on 09/06/2023.  Urinalysis has greater than 50 RBCs, 11-20 WBCs, rare bacteria, 21-50 squamous epithelial cells.  This is a contaminated specimen, it does not definitely show signs of UTI.  However, I am ordering a single dose of cephalexin to augment ciprofloxacin which she was prescribed following her surgery.  I have ordered a dose of prochlorperazine.  Following this, she did note improvement in nausea.  I suspect her nausea is related to recent anesthesia.  I am discharging her with a prescription of for prochlorperazine.  I am recommending she follow-up with her surgeon regarding her bleeding.  Final Clinical Impression(s) / ED Diagnoses Final diagnoses:  Nausea    Rx / DC Orders ED Discharge Orders          Ordered    prochlorperazine (COMPAZINE) 10 MG tablet  Every 6 hours PRN        09/07/23 0519              Dione Booze, MD 09/07/23 [phone]

## 2023-09-07 NOTE — ED Notes (Signed)
Dr. Preston Fleeting at bedside to assess pt

## 2023-09-07 NOTE — ED Notes (Signed)
Asked pt if she thinks she could provide a urine sample, pt stated "yall could have got that earlier if someone would have told me out there that you needed one" (harsh tone of voice). Advised pt no worries, we could collect at later time, if she could please let nurse of tech know when she may be able to urinate again and till try for sample then.

## 2023-09-07 NOTE — ED Notes (Signed)
Pt and husband upset as they have been in [facility] for long wait time, both upset when advised on a hall bed, pt's husband made statement while walking back to hall bed "I'ma go sit in the car before I half to cuss someone out while you are laying in a hall bed". Pt husband also upset that "she is not even supposed to be sitting in the chair like yall put her in the waiting room, she supposed to be laying down".  Pt's husband followed this RN and nurse to [place] [facility] bed, told pt to call him in the morning, pt responded with "I'll let you know when they decide to get me in an actual room"  When asking pt about her current pain and about her current visit pt tone is very harsh and firm, pt appears irritable regarding hall bed and situation, apologized to pt for not having a private treatment area at this time. No acknowledgement from pt as she typing on her phone.

## 2023-09-07 NOTE — Discharge Instructions (Addendum)
I suspect that your nausea is a reaction to anesthesia.  This should resolve over the next 1-2 days.  Please follow-up with your surgeon regarding the bleeding you have noticed.  Return if you have any new or concerning symptoms.

## 2023-09-11 ENCOUNTER — Ambulatory Visit (INDEPENDENT_AMBULATORY_CARE_PROVIDER_SITE_OTHER): Payer: Managed Care, Other (non HMO) | Admitting: Obstetrics & Gynecology

## 2023-09-11 ENCOUNTER — Encounter: Payer: Self-pay | Admitting: Obstetrics & Gynecology

## 2023-09-11 ENCOUNTER — Other Ambulatory Visit: Payer: Self-pay | Admitting: Obstetrics & Gynecology

## 2023-09-11 VITALS — BP 117/74 | HR 78 | Wt 132.0 lb

## 2023-09-11 DIAGNOSIS — Z9889 Other specified postprocedural states: Secondary | ICD-10-CM

## 2023-09-11 NOTE — Progress Notes (Signed)
  HPI: Patient returns for routine postoperative follow-up having undergone RA TLH + BS 09/05/23 on no problems.  The patient's immediate postoperative recovery has been unremarkable.    Current Outpatient Medications: acetaminophen (TYLENOL) 500 MG tablet, Take 1,000 mg by mouth every 6 (six) hours as needed for moderate pain., Disp: , Rfl:  celecoxib (CELEBREX) 100 MG capsule, Take 100 mg by mouth 2 (two) times daily., Disp: , Rfl:  ciprofloxacin (CIPRO) 500 MG tablet, Take 1 tablet (500 mg total) by mouth 2 (two) times daily., Disp: 14 tablet, Rfl: 0 fexofenadine (ALLEGRA) 180 MG tablet, Take 180 mg by mouth daily., Disp: , Rfl:  ketorolac (TORADOL) 10 MG tablet, Take 1 tablet (10 mg total) by mouth every 8 (eight) hours as needed., Disp: 15 tablet, Rfl: 0 lisdexamfetamine (VYVANSE) 30 MG capsule, Take 30 mg by mouth daily., Disp: , Rfl:  loratadine (CLARITIN) 10 MG tablet, Take 10 mg by mouth at bedtime., Disp: , Rfl:  ondansetron (ZOFRAN-ODT) 8 MG disintegrating tablet, Take 1 tablet (8 mg total) by mouth every 8 (eight) hours as needed for nausea or vomiting., Disp: 8 tablet, Rfl: 0 oxyCODONE-acetaminophen (PERCOCET) 7.5-325 MG tablet, Take 1 tablet by mouth every 6 (six) hours as needed., Disp: 28 tablet, Rfl: 0 pantoprazole (PROTONIX) 40 MG tablet, TAKE 1 TABLET BY MOUTH DAILY, Disp: 90 tablet, Rfl: 1 potassium chloride SA (KLOR-CON M) 20 MEQ tablet, Take 1 tablet (20 mEq total) by mouth daily., Disp: 60 tablet, Rfl: 0 prochlorperazine (COMPAZINE) 10 MG tablet, Take 1 tablet (10 mg total) by mouth every 6 (six) hours as needed for nausea or vomiting., Disp: 20 tablet, Rfl: 0 sertraline (ZOLOFT) 50 MG tablet, Take 50 mg by mouth daily., Disp: , Rfl:  sucralfate (CARAFATE) 1 g tablet, Take 1 tablet (1 g total) by mouth 4 (four) times daily -  with meals and at bedtime., Disp: 90 tablet, Rfl: 1  No current facility-administered medications for this visit.    Blood pressure 117/74,  pulse 78, weight 132 lb (59.9 kg), last menstrual period 07/22/2023.  Physical Exam: 5 incisions all look good abdomen is soft benign  Diagnostic Tests:   Pathology: benign  Impression + Management plan: (W09.811) Post-operative state: 09/05/23 RA TLH + BS  (primary encounter diagnosis) Comment: doing well     Medications Prescribed this encounter: No orders of the defined types were placed in this encounter.     Follow up: Return in about 6 weeks (around 10/23/2023) for Follow up, with Dr Despina Hidden.    Lazaro Arms, MD Attending Physician for the Center for [facility] and [facility] 09/11/2023 9:21 AM

## 2023-09-12 ENCOUNTER — Other Ambulatory Visit: Payer: Self-pay | Admitting: Obstetrics & Gynecology

## 2023-09-12 MED ORDER — ONDANSETRON 8 MG PO TBDP: 8.0000 mg | ORAL_TABLET | Freq: Three times a day (TID) | ORAL | 0 refills | Status: DC | PRN

## 2023-09-12 MED ORDER — KETOROLAC TROMETHAMINE 10 MG PO TABS: 10.0000 mg | ORAL_TABLET | Freq: Three times a day (TID) | ORAL | 0 refills | Status: DC | PRN

## 2023-09-14 ENCOUNTER — Ambulatory Visit (INDEPENDENT_AMBULATORY_CARE_PROVIDER_SITE_OTHER): Payer: Managed Care, Other (non HMO) | Admitting: *Deleted

## 2023-09-14 DIAGNOSIS — E538 Deficiency of other specified B group vitamins: Secondary | ICD-10-CM

## 2023-09-14 MED ORDER — CYANOCOBALAMIN 1000 MCG/ML IJ SOLN
1000.0000 ug | INTRAMUSCULAR | Status: AC
Start: 2023-09-14 — End: 2024-09-08
  Administered 2023-09-14 – 2023-11-19 (×3): 1000 ug via INTRAMUSCULAR

## 2023-09-14 NOTE — Progress Notes (Signed)
Pt in today for B12, tolerated well.

## 2023-09-18 ENCOUNTER — Other Ambulatory Visit: Payer: Self-pay | Admitting: Obstetrics & Gynecology

## 2023-09-18 ENCOUNTER — Encounter: Payer: Self-pay | Admitting: Obstetrics & Gynecology

## 2023-09-18 DIAGNOSIS — N951 Menopausal and female climacteric states: Secondary | ICD-10-CM

## 2023-09-24 ENCOUNTER — Encounter: Payer: Self-pay | Admitting: Obstetrics & Gynecology

## 2023-09-24 ENCOUNTER — Other Ambulatory Visit: Payer: Self-pay | Admitting: Family Medicine

## 2023-09-24 DIAGNOSIS — K219 Gastro-esophageal reflux disease without esophagitis: Secondary | ICD-10-CM

## 2023-09-25 NOTE — Addendum Note (Signed)
Addended by: Lazaro Arms on: 09/25/2023 10:55 AM   Modules accepted: Orders

## 2023-09-26 DIAGNOSIS — F331 Major depressive disorder, recurrent, moderate: Secondary | ICD-10-CM | POA: Diagnosis not present

## 2023-09-26 DIAGNOSIS — F901 Attention-deficit hyperactivity disorder, predominantly hyperactive type: Secondary | ICD-10-CM | POA: Diagnosis not present

## 2023-09-26 DIAGNOSIS — F411 Generalized anxiety disorder: Secondary | ICD-10-CM | POA: Diagnosis not present

## 2023-10-03 ENCOUNTER — Ambulatory Visit: Payer: Managed Care, Other (non HMO) | Admitting: Family Medicine

## 2023-10-04 ENCOUNTER — Other Ambulatory Visit: Payer: Self-pay | Admitting: Obstetrics & Gynecology

## 2023-10-05 LAB — FOLLICLE STIMULATING HORMONE: FSH: 4.1 m[IU]/mL

## 2023-10-10 DIAGNOSIS — F331 Major depressive disorder, recurrent, moderate: Secondary | ICD-10-CM | POA: Diagnosis not present

## 2023-10-10 DIAGNOSIS — F901 Attention-deficit hyperactivity disorder, predominantly hyperactive type: Secondary | ICD-10-CM | POA: Diagnosis not present

## 2023-10-10 DIAGNOSIS — F411 Generalized anxiety disorder: Secondary | ICD-10-CM | POA: Diagnosis not present

## 2023-10-15 ENCOUNTER — Ambulatory Visit (INDEPENDENT_AMBULATORY_CARE_PROVIDER_SITE_OTHER): Payer: Managed Care, Other (non HMO)

## 2023-10-15 DIAGNOSIS — E538 Deficiency of other specified B group vitamins: Secondary | ICD-10-CM | POA: Diagnosis not present

## 2023-10-15 NOTE — Progress Notes (Signed)
Cyanocobalamin injection given to left deltoid.  Patient tolerated well. 

## 2023-10-16 ENCOUNTER — Ambulatory Visit: Payer: Managed Care, Other (non HMO) | Admitting: Family Medicine

## 2023-10-16 VITALS — BP 115/73 | HR 93 | Temp 98.1°F | Ht 64.0 in | Wt 133.8 lb

## 2023-10-16 DIAGNOSIS — K644 Residual hemorrhoidal skin tags: Secondary | ICD-10-CM

## 2023-10-16 MED ORDER — HYDROCORTISONE ACETATE 25 MG RE SUPP: 25.0000 mg | Freq: Two times a day (BID) | RECTAL | 0 refills | Status: AC

## 2023-10-16 NOTE — Progress Notes (Signed)
Subjective:  Patient ID: Lindsay Mccoy, female    DOB: 07/01/1986, 37 y.o.   MRN: [id number]  Patient Care Team: Sonny Masters, FNP as PCP - General (Family Medicine) Marjo Bicker, MD as PCP - Cardiology (Cardiology)   Chief Complaint:  Hemorrhoids (Just had hysterectomy in October having hot flashes took every thing but ovaries )   HPI: Lindsay Mccoy is a 37 y.o. female presenting on 10/16/2023 for Hemorrhoids (Just had hysterectomy in October having hot flashes took every thing but ovaries )   Discussed the use of AI scribe software for clinical note transcription with the patient, who gave verbal consent to proceed.  History of Present Illness   The patient, with a history of hysterectomy, presents with complaints of hemorrhoids and hot flashes. The hemorrhoids have been an intermittent issue for the past couple of months. The patient describes the stools as generally improved, but occasionally notices light red blood upon wiping. She reports occasional rectal pain and believes the hemorrhoids to be internal. The patient has been maintaining hydration but did not mention any specific increase in fiber intake.  In addition to the hemorrhoids, the patient has been experiencing hot flashes since her hysterectomy in October, during which one ovary was retained. She has a follow-up appointment scheduled for these symptoms.          Relevant past medical, surgical, family, and social history reviewed and updated as indicated.  Allergies and medications reviewed and updated. Data reviewed: Chart in Epic.   Past Medical History:  Diagnosis Date   Bronchitis    Gastritis    GERD (gastroesophageal reflux disease)    Scoliosis     Past Surgical History:  Procedure Laterality Date   ROBOTIC ASSISTED TOTAL HYSTERECTOMY WITH BILATERAL SALPINGO OOPHERECTOMY Bilateral 09/05/2023   Procedure: XI ROBOTIC ASSISTED TOTAL HYSTERECTOMY WITH BILATERAL SALPINGECTOMY;  Surgeon: Lazaro Arms,  MD;  Location: [facility];  Service: Gynecology;  Laterality: Bilateral;   UPPER GI ENDOSCOPY      Social History   Socioeconomic History   Marital status: Married    Spouse name: Not on file   Number of children: Not on file   Years of education: Not on file   Highest education level: Associate degree: academic program  Occupational History   Not on file  Tobacco Use   Smoking status: Former    Current packs/day: 0.00    Average packs/day: 0.5 packs/day for 20.0 years (10.0 ttl pk-yrs)    Types: Cigarettes    Start date: 10/06/1995    Quit date: 10/06/2015    Years since quitting: 8.0   Smokeless tobacco: Never  Vaping Use   Vaping status: Every Day  Substance and Sexual Activity   Alcohol use: Yes    Comment: occassionally    Drug use: Never   Sexual activity: Yes    Birth control/protection: Surgical  Other Topics Concern   Not on file  Social History Narrative   Not on file   Social Determinants of Health   Financial Resource Strain: Low Risk  (10/16/2023)   Overall Financial Resource Strain (CARDIA)    Difficulty of Paying Living Expenses: Not hard at all  Food Insecurity: No Food Insecurity (10/16/2023)   Hunger Vital Sign    Worried About Running Out of Food in the Last Year: Never true    Ran Out of Food in the Last Year: Never true  Transportation Needs: No Transportation Needs (10/16/2023)   PRAPARE -  Administrator, Civil Service (Medical): No    Lack of Transportation (Non-Medical): No  Physical Activity: Insufficiently Active (10/16/2023)   Exercise Vital Sign    Days of Exercise per Week: 3 days    Minutes of Exercise per Session: 20 min  Stress: No Stress Concern Present (10/16/2023)   [employer] of Occupational Health - Occupational Stress Questionnaire    Feeling of Stress : Only a little  Social Connections: Moderately Integrated (10/16/2023)   Social Connection and Isolation Panel [NHANES]    Frequency of Communication with  Friends and Family: More than three times a week    Frequency of Social Gatherings with Friends and Family: Once a week    Attends Religious Services: More than 4 times per year    Active Member of [employer] or Organizations: No    Attends Engineer, structural: Not on file    Marital Status: Married  Intimate Partner Violence: Unknown (02/23/2022)   Received from [employer], [facility]   HITS    Physically Hurt: Not on file    Insult or Talk Down To: Not on file    Threaten Physical Harm: Not on file    Scream or Curse: Not on file    Outpatient Encounter Medications as of 10/16/2023  Medication Sig   acetaminophen (TYLENOL) 500 MG tablet Take 1,000 mg by mouth every 6 (six) hours as needed for moderate pain.   celecoxib (CELEBREX) 100 MG capsule Take 100 mg by mouth 2 (two) times daily.   fexofenadine (ALLEGRA) 180 MG tablet Take 180 mg by mouth daily.   hydrocortisone (ANUSOL-HC) 25 MG suppository Place 1 suppository (25 mg total) rectally 2 (two) times daily for 3 days.   ketorolac (TORADOL) 10 MG tablet Take 1 tablet (10 mg total) by mouth every 8 (eight) hours as needed.   lisdexamfetamine (VYVANSE) 30 MG capsule Take 30 mg by mouth daily.   loratadine (CLARITIN) 10 MG tablet Take 10 mg by mouth at bedtime.   ondansetron (ZOFRAN-ODT) 8 MG disintegrating tablet Take 1 tablet (8 mg total) by mouth every 8 (eight) hours as needed for nausea or vomiting.   pantoprazole (PROTONIX) 40 MG tablet TAKE 1 TABLET BY MOUTH DAILY   potassium chloride SA (KLOR-CON M) 20 MEQ tablet Take 1 tablet (20 mEq total) by mouth daily.   prochlorperazine (COMPAZINE) 10 MG tablet Take 1 tablet (10 mg total) by mouth every 6 (six) hours as needed for nausea or vomiting.   sertraline (ZOLOFT) 50 MG tablet Take 50 mg by mouth daily.   sucralfate (CARAFATE) 1 g tablet Take 1 tablet (1 g total) by mouth 4 (four) times daily -  with meals and at bedtime.   [DISCONTINUED] ciprofloxacin (CIPRO) 500 MG  tablet Take 1 tablet (500 mg total) by mouth 2 (two) times daily.   [DISCONTINUED] omeprazole (PRILOSEC) 20 MG capsule Take 1 capsule (20 mg total) by mouth 2 (two) times daily before a meal.   [DISCONTINUED] oxyCODONE-acetaminophen (PERCOCET) 7.5-325 MG tablet Take 1 tablet by mouth every 6 (six) hours as needed.   Facility-Administered Encounter Medications as of 10/16/2023  Medication   cyanocobalamin (VITAMIN B12) injection 1,000 mcg    No Known Allergies  Pertinent ROS per HPI, otherwise unremarkable      Objective:  BP 115/73   Pulse 93   Temp 98.1 F (36.7 C) (Temporal)   Ht 5\' 4"  (1.626 m)   Wt 133 lb 12.8 oz (60.7 kg)  LMP 07/22/2023 (Approximate)   SpO2 97%   BMI 22.97 kg/m    Wt Readings from Last 3 Encounters:  10/16/23 133 lb 12.8 oz (60.7 kg)  09/11/23 132 lb (59.9 kg)  09/06/23 134 lb 14.7 oz (61.2 kg)    Physical Exam Vitals and nursing note reviewed.  Constitutional:      General: She is not in acute distress.    Appearance: She is normal weight. She is not ill-appearing, toxic-appearing or diaphoretic.     Comments: Appears older than stated age  HENT:     Head: Normocephalic and atraumatic.     Nose: Nose normal.     Mouth/Throat:     Mouth: Mucous membranes are moist.     Pharynx: Oropharynx is clear.  Eyes:     Conjunctiva/sclera: Conjunctivae normal.     Pupils: Pupils are equal, round, and reactive to light.  Cardiovascular:     Rate and Rhythm: Normal rate and regular rhythm.  Pulmonary:     Effort: Pulmonary effort is normal.     Breath sounds: Normal breath sounds.  Genitourinary:    Rectum: External hemorrhoid present.     Comments: External hemorrhoids without thrombosis or active bleeding Musculoskeletal:     Comments: Severe scoliosis of thoracic spine  Skin:    General: Skin is warm and dry.     Capillary Refill: Capillary refill takes less than 2 seconds.  Neurological:     General: No focal deficit present.     Mental  Status: She is alert and oriented to person, place, and time.  Psychiatric:        Mood and Affect: Mood normal.        Judgment: Judgment normal.    Physical Exam   RECTAL: Multiple external hemorrhoids noted.        Results for orders placed or performed in visit on 09/18/23  [facility]  Result Value Ref Range   FSH 4.1 mIU/mL       Pertinent labs & imaging results that were available during my care of the patient were reviewed by me and considered in my medical decision making.  Assessment & Plan:  Chalet was seen today for hemorrhoids.  Diagnoses and all orders for this visit:  External hemorrhoids -     hydrocortisone (ANUSOL-HC) 25 MG suppository; Place 1 suppository (25 mg total) rectally 2 (two) times daily for 3 days.     Assessment and Plan    External Hemorrhoids Intermittent rectal bleeding and pain over the past couple of months. Several external hemorrhoids noted on physical examination. Stools are formed and soft. No blood in the toilet, only on tissue. Discussed treatment with Anusol (steroid) to shrink hemorrhoids. Emphasized the importance of increased water intake to keep stools soft to prevent further irritation. - Prescribe Anusol (steroid) twice daily for three days - Advise increased water intake - Provide hemorrhoid management handout - Follow up if symptoms worsen or additional symptoms develop for potential GI referral  Hot Flashes Experiencing hot flashes. Hysterectomy in October with ovaries intact. Follow-up with gynecologist scheduled for early next month. - Advise follow-up with gynecologist as scheduled.          Continue all other maintenance medications.  Follow up plan: Return if symptoms worsen or fail to improve.   Continue healthy lifestyle choices, including diet (rich in fruits, vegetables, and lean proteins, and low in salt and simple carbohydrates) and exercise (at least 30 minutes of moderate physical activity  daily).  Educational handout given for hemorrhoids   The above assessment and management plan was discussed with the patient. The patient verbalized understanding of and has agreed to the management plan. Patient is aware to call the clinic if they develop any new symptoms or if symptoms persist or worsen. Patient is aware when to return to the clinic for a follow-up visit. Patient educated on when it is appropriate to go to the emergency department.   Kari Baars, FNP-C [facility] [phone]

## 2023-10-23 ENCOUNTER — Other Ambulatory Visit: Payer: Self-pay | Admitting: Obstetrics & Gynecology

## 2023-10-23 ENCOUNTER — Ambulatory Visit (INDEPENDENT_AMBULATORY_CARE_PROVIDER_SITE_OTHER): Payer: Managed Care, Other (non HMO) | Admitting: Obstetrics & Gynecology

## 2023-10-23 VITALS — BP 111/72 | HR 73 | Ht 64.0 in | Wt 135.0 lb

## 2023-10-23 DIAGNOSIS — Z9889 Other specified postprocedural states: Secondary | ICD-10-CM

## 2023-10-23 MED ORDER — METRONIDAZOLE 0.75 % VA GEL: 1.0000 | Freq: Every day | VAGINAL | 5 refills | Status: AC

## 2023-10-23 MED ORDER — ONDANSETRON 8 MG PO TBDP: 8.0000 mg | ORAL_TABLET | Freq: Three times a day (TID) | ORAL | 0 refills | Status: DC | PRN

## 2023-10-23 NOTE — Progress Notes (Signed)
  HPI: Patient returns for routine postoperative follow-up having undergone RA TLD + BS on 09/05/23.  The patient's immediate postoperative recovery has been unremarkable. Since hospital discharge the patient reports some discharge.   Current Outpatient Medications: acetaminophen (TYLENOL) 500 MG tablet, Take 1,000 mg by mouth every 6 (six) hours as needed for moderate pain., Disp: , Rfl:  celecoxib (CELEBREX) 100 MG capsule, Take 100 mg by mouth 2 (two) times daily., Disp: , Rfl:  fexofenadine (ALLEGRA) 180 MG tablet, Take 180 mg by mouth daily., Disp: , Rfl:  lisdexamfetamine (VYVANSE) 30 MG capsule, Take 30 mg by mouth daily., Disp: , Rfl:  loratadine (CLARITIN) 10 MG tablet, Take 10 mg by mouth at bedtime., Disp: , Rfl:  pantoprazole (PROTONIX) 40 MG tablet, TAKE 1 TABLET BY MOUTH DAILY, Disp: 90 tablet, Rfl: 0 potassium chloride SA (KLOR-CON M) 20 MEQ tablet, Take 1 tablet (20 mEq total) by mouth daily., Disp: 60 tablet, Rfl: 0 prochlorperazine (COMPAZINE) 10 MG tablet, Take 1 tablet (10 mg total) by mouth every 6 (six) hours as needed for nausea or vomiting., Disp: 20 tablet, Rfl: 0 sertraline (ZOLOFT) 50 MG tablet, Take 50 mg by mouth daily., Disp: , Rfl:  ketorolac (TORADOL) 10 MG tablet, Take 1 tablet (10 mg total) by mouth every 8 (eight) hours as needed. (Patient not taking: Reported on 10/23/2023), Disp: 15 tablet, Rfl: 0 ondansetron (ZOFRAN-ODT) 8 MG disintegrating tablet, Take 1 tablet (8 mg total) by mouth every 8 (eight) hours as needed for nausea or vomiting. (Patient not taking: Reported on 10/23/2023), Disp: 8 tablet, Rfl: 0 sucralfate (CARAFATE) 1 g tablet, Take 1 tablet (1 g total) by mouth 4 (four) times daily -  with meals and at bedtime., Disp: 90 tablet, Rfl: 1  Current Facility-Administered Medications: cyanocobalamin (VITAMIN B12) injection 1,000 mcg, 1,000 mcg, Intramuscular, Q30 days, Rakes, Linda M, FNP, 1,000 mcg at 10/15/23 1555     Blood pressure 111/72, pulse  73, height 5\' 4"  (1.626 m), weight 135 lb (61.2 kg), last menstrual period 07/22/2023.  Physical Exam: 5 incisions well healed Cuff healing well Some minor discharge liekly BV , not yeast  Diagnostic Tests:   Pathology: benign  Impression + Management plan:   ICD-10-CM   1. Post-operative state: 09/05/23 RA TLH + BS  Z98.890           Medications Prescribed this encounter:      Follow up: No follow-ups on file.    Lazaro Arms, MD Attending Physician for the Center for [facility] and [facility] 10/23/2023 11:35 AM

## 2023-10-29 ENCOUNTER — Encounter: Payer: Self-pay | Admitting: Obstetrics & Gynecology

## 2023-10-29 MED ORDER — ESTRADIOL 0.1 MG/24HR TD PTTW: 1.0000 | MEDICATED_PATCH | TRANSDERMAL | 12 refills | Status: DC

## 2023-10-30 ENCOUNTER — Encounter: Payer: Self-pay | Admitting: Family Medicine

## 2023-11-02 ENCOUNTER — Ambulatory Visit (INDEPENDENT_AMBULATORY_CARE_PROVIDER_SITE_OTHER): Payer: Managed Care, Other (non HMO) | Admitting: Family Medicine

## 2023-11-02 ENCOUNTER — Encounter: Payer: Self-pay | Admitting: Family Medicine

## 2023-11-02 VITALS — BP 112/70 | HR 84 | Temp 97.1°F | Ht 64.0 in | Wt 135.6 lb

## 2023-11-02 DIAGNOSIS — Z13220 Encounter for screening for lipoid disorders: Secondary | ICD-10-CM | POA: Diagnosis not present

## 2023-11-02 DIAGNOSIS — Z23 Encounter for immunization: Secondary | ICD-10-CM | POA: Diagnosis not present

## 2023-11-02 DIAGNOSIS — Z114 Encounter for screening for human immunodeficiency virus [HIV]: Secondary | ICD-10-CM

## 2023-11-02 DIAGNOSIS — Z1159 Encounter for screening for other viral diseases: Secondary | ICD-10-CM

## 2023-11-02 DIAGNOSIS — Z130 Encounter for screening for diseases of the blood and blood-forming organs and certain disorders involving the immune mechanism: Secondary | ICD-10-CM | POA: Diagnosis not present

## 2023-11-02 DIAGNOSIS — Z0001 Encounter for general adult medical examination with abnormal findings: Secondary | ICD-10-CM | POA: Diagnosis not present

## 2023-11-02 DIAGNOSIS — Z0000 Encounter for general adult medical examination without abnormal findings: Secondary | ICD-10-CM

## 2023-11-02 DIAGNOSIS — E559 Vitamin D deficiency, unspecified: Secondary | ICD-10-CM

## 2023-11-02 DIAGNOSIS — Z1329 Encounter for screening for other suspected endocrine disorder: Secondary | ICD-10-CM

## 2023-11-02 LAB — LIPID PANEL

## 2023-11-02 NOTE — Addendum Note (Signed)
Addended by: Lorelee Cover C on: 11/02/2023 11:33 AM   Modules accepted: Orders

## 2023-11-02 NOTE — Progress Notes (Signed)
Complete physical exam  Patient: Lindsay Mccoy   DOB: 1985-11-24   37 y.o. Female  MRN: [id number]  Subjective:    Chief Complaint  Patient presents with   Annual Exam    Kelyse Yamamoto is a 37 y.o. female who presents today for a complete physical exam. She reports consuming a general diet. The patient does not participate in regular exercise at present. She generally feels well. She reports sleeping well. She does not have additional problems to discuss today.   Discussed the use of AI scribe software for clinical note transcription with the patient, who gave verbal consent to proceed.  History of Present Illness   The patient, with a history of allergies, scoliosis, and hysterectomy, presents for a routine physical. She reports experiencing allergy symptoms, including occasional shortness of breath, which she manages with Claritin, Allegra, and Astepro nasal spray.   The patient underwent a hysterectomy due to an enlarged uterus, but the ovaries were preserved. Postoperatively, she has been experiencing hot flashes and vaginal discharge. An estradiol patch was prescribed to manage these symptoms. The patient's family history is significant for various cancers, including breast and lung cancer.  The patient also reports occasional sharp chest pain that comes and goes, sometimes in the morning, sometimes in the evening. The pain is worse with deep breathing and movement. She denies any associated palpitations, jaw, neck, or arm pain. She also mentions a diagnosis of scoliosis and a possible pinched nerve in the neck, but insurance has denied the requested MRI.  The patient has been experiencing constipation, which she attributes to her ferrous sulfate medication. She also reports occasional low energy levels. Sleep varies, sometimes falling asleep immediately, other times taking hours. Prior to bed, she typically plays solitaire or watches TV on her phone.  The patient also reports a history of anemia  and is currently receiving monthly B12 shots.       Most recent fall risk assessment:    11/02/2023   10:16 AM  Fall Risk   Falls in the past year? 0     Most recent depression screenings:    11/02/2023   10:16 AM 10/16/2023   11:44 AM  PHQ 2/9 Scores  PHQ - 2 Score 0 0  PHQ- 9 Score 0 0    Vision:Not within last year  and Dental: No current dental problems and Receives regular dental care  Patient Active Problem List   Diagnosis Date Noted   Menorrhagia with regular cycle 09/05/2023   Fibroids 09/05/2023   Dysmenorrhea 09/05/2023   Acute pelvic pain, female 09/05/2023   Adult ADHD 05/02/2023   Depression, recurrent (HCC) 05/02/2023   GERD without esophagitis 03/03/2022   Chronic high back pain 02/24/2021   Mild intermittent asthma without complication 02/24/2021   Scoliosis deformity of spine 02/24/2021   Past Medical History:  Diagnosis Date   Bronchitis    Gastritis    GERD (gastroesophageal reflux disease)    Scoliosis    Past Surgical History:  Procedure Laterality Date   ROBOTIC ASSISTED TOTAL HYSTERECTOMY WITH BILATERAL SALPINGO OOPHERECTOMY Bilateral 09/05/2023   Procedure: XI ROBOTIC ASSISTED TOTAL HYSTERECTOMY WITH BILATERAL SALPINGECTOMY;  Surgeon: Lazaro Arms, MD;  Location: [facility];  Service: Gynecology;  Laterality: Bilateral;   UPPER GI ENDOSCOPY     Social History   Tobacco Use   Smoking status: Former    Current packs/day: 0.00    Average packs/day: 0.5 packs/day for 20.0 years (10.0 ttl pk-yrs)    Types:  Cigarettes    Start date: 10/06/1995    Quit date: 10/06/2015    Years since quitting: 8.0   Smokeless tobacco: Never  Vaping Use   Vaping status: Every Day  Substance Use Topics   Alcohol use: Yes    Comment: occassionally    Drug use: Never   Social History   Socioeconomic History   Marital status: Married    Spouse name: Not on file   Number of children: Not on file   Years of education: Not on file   Highest education  level: Associate degree: academic program  Occupational History   Not on file  Tobacco Use   Smoking status: Former    Current packs/day: 0.00    Average packs/day: 0.5 packs/day for 20.0 years (10.0 ttl pk-yrs)    Types: Cigarettes    Start date: 10/06/1995    Quit date: 10/06/2015    Years since quitting: 8.0   Smokeless tobacco: Never  Vaping Use   Vaping status: Every Day  Substance and Sexual Activity   Alcohol use: Yes    Comment: occassionally    Drug use: Never   Sexual activity: Yes    Birth control/protection: Surgical  Other Topics Concern   Not on file  Social History Narrative   Not on file   Social Drivers of Health   Financial Resource Strain: Low Risk  (10/16/2023)   Overall Financial Resource Strain (CARDIA)    Difficulty of Paying Living Expenses: Not hard at all  Food Insecurity: No Food Insecurity (10/16/2023)   Hunger Vital Sign    Worried About Running Out of Food in the Last Year: Never true    Ran Out of Food in the Last Year: Never true  Transportation Needs: No Transportation Needs (10/16/2023)   PRAPARE - Administrator, Civil Service (Medical): No    Lack of Transportation (Non-Medical): No  Physical Activity: Insufficiently Active (10/16/2023)   Exercise Vital Sign    Days of Exercise per Week: 3 days    Minutes of Exercise per Session: 20 min  Stress: No Stress Concern Present (10/16/2023)   [employer] of Occupational Health - Occupational Stress Questionnaire    Feeling of Stress : Only a little  Social Connections: Moderately Integrated (10/16/2023)   Social Connection and Isolation Panel [NHANES]    Frequency of Communication with Friends and Family: More than three times a week    Frequency of Social Gatherings with Friends and Family: Once a week    Attends Religious Services: More than 4 times per year    Active Member of [employer] or Organizations: No    Attends Engineer, structural: Not on file    Marital  Status: Married  Intimate Partner Violence: Unknown (02/23/2022)   Received from [employer], [facility]   HITS    Physically Hurt: Not on file    Insult or Talk Down To: Not on file    Threaten Physical Harm: Not on file    Scream or Curse: Not on file   Family Status  Relation Name Status   Mother  Alive   Father  Deceased   Mat Aunt  (Not Specified)   MGM  Deceased   MGF  Deceased   PGM  Alive   PGF  Deceased  No partnership data on file   Family History  Problem Relation Age of Onset   Scoliosis Mother    Asthma Mother    Breast cancer Maternal Aunt  Breast cancer Maternal Grandmother    No Known Allergies    Patient Care Team: Rubert Frediani, Doralee Albino, FNP as PCP - General (Family Medicine) Mallipeddi, Orion Modest, MD as PCP - Cardiology (Cardiology)   Outpatient Medications Prior to Visit  Medication Sig   acetaminophen (TYLENOL) 500 MG tablet Take 1,000 mg by mouth every 6 (six) hours as needed for moderate pain.   celecoxib (CELEBREX) 100 MG capsule Take 100 mg by mouth 2 (two) times daily.   estradiol (VIVELLE-DOT) 0.1 MG/24HR patch Place 1 patch (0.1 mg total) onto the skin 2 (two) times a week.   fexofenadine (ALLEGRA) 180 MG tablet Take 180 mg by mouth daily.   ketorolac (TORADOL) 10 MG tablet Take 1 tablet (10 mg total) by mouth every 8 (eight) hours as needed.   lisdexamfetamine (VYVANSE) 30 MG capsule Take 30 mg by mouth daily.   loratadine (CLARITIN) 10 MG tablet Take 10 mg by mouth at bedtime.   metroNIDAZOLE (METROGEL) 0.75 % vaginal gel Place 1 Applicatorful vaginally at bedtime.   ondansetron (ZOFRAN-ODT) 8 MG disintegrating tablet Take 1 tablet (8 mg total) by mouth every 8 (eight) hours as needed for nausea or vomiting.   pantoprazole (PROTONIX) 40 MG tablet TAKE 1 TABLET BY MOUTH DAILY   potassium chloride SA (KLOR-CON M) 20 MEQ tablet Take 1 tablet (20 mEq total) by mouth daily.   prochlorperazine (COMPAZINE) 10 MG tablet Take 1 tablet (10 mg total) by  mouth every 6 (six) hours as needed for nausea or vomiting.   sertraline (ZOLOFT) 50 MG tablet Take 50 mg by mouth daily.   sucralfate (CARAFATE) 1 g tablet Take 1 tablet (1 g total) by mouth 4 (four) times daily -  with meals and at bedtime.   Facility-Administered Medications Prior to Visit  Medication Dose Route Frequency Provider   cyanocobalamin (VITAMIN B12) injection 1,000 mcg  1,000 mcg Intramuscular Q30 days Sonny Masters, FNP    Review of Systems  Genitourinary:        Vaginal discharge, hot flashes  Musculoskeletal:  Positive for back pain, joint pain and neck pain.  Psychiatric/Behavioral:  The patient has insomnia (intermittent).   All other systems reviewed and are negative.       Objective:     BP 112/70   Pulse 84   Temp (!) 97.1 F (36.2 C)   Ht 5\' 4"  (1.626 m)   Wt 135 lb 9.6 oz (61.5 kg)   LMP 07/22/2023 (Approximate)   BMI 23.28 kg/m  BP Readings from Last 3 Encounters:  11/02/23 112/70  10/23/23 111/72  10/16/23 115/73   Wt Readings from Last 3 Encounters:  11/02/23 135 lb 9.6 oz (61.5 kg)  10/23/23 135 lb (61.2 kg)  10/16/23 133 lb 12.8 oz (60.7 kg)   SpO2 Readings from Last 3 Encounters:  10/16/23 97%  09/07/23 100%  09/05/23 98%      Physical Exam Vitals and nursing note reviewed.  Constitutional:      General: She is not in acute distress.    Appearance: She is normal weight. She is not ill-appearing, toxic-appearing or diaphoretic.     Comments: Appears older than stated age  HENT:     Head: Normocephalic and atraumatic.     Right Ear: Tympanic membrane, ear canal and external ear normal.     Left Ear: Tympanic membrane, ear canal and external ear normal.     Nose: Nose normal.     Mouth/Throat:     Mouth:  Mucous membranes are moist.     Pharynx: Oropharynx is clear.  Eyes:     Extraocular Movements: Extraocular movements intact.     Conjunctiva/sclera: Conjunctivae normal.     Pupils: Pupils are equal, round, and reactive to  light.  Neck:     Vascular: No carotid bruit.  Cardiovascular:     Rate and Rhythm: Normal rate and regular rhythm.     Heart sounds: Normal heart sounds.  Pulmonary:     Effort: Pulmonary effort is normal.     Breath sounds: Normal breath sounds.  Abdominal:     General: Bowel sounds are normal.     Palpations: Abdomen is soft.  Genitourinary:    Comments: Sees GYN Musculoskeletal:     Cervical back: Normal range of motion and neck supple. No tenderness.     Right lower leg: No edema.     Left lower leg: No edema.     Comments: Severe scoliosis of thoracic spine  Lymphadenopathy:     Cervical: No cervical adenopathy.  Skin:    General: Skin is warm and dry.     Capillary Refill: Capillary refill takes less than 2 seconds.  Neurological:     General: No focal deficit present.     Mental Status: She is alert and oriented to person, place, and time.  Psychiatric:        Mood and Affect: Mood normal.        Behavior: Behavior normal.        Thought Content: Thought content normal.        Judgment: Judgment normal.       Last CBC Lab Results  Component Value Date   WBC 7.9 09/06/2023   HGB 12.9 09/06/2023   HCT 39.0 09/06/2023   MCV 96.5 09/06/2023   MCH 31.9 09/06/2023   RDW 13.2 09/06/2023   PLT 220 09/06/2023   Last metabolic panel Lab Results  Component Value Date   GLUCOSE 98 09/06/2023   NA 135 09/06/2023   K 4.1 09/06/2023   CL 102 09/06/2023   CO2 24 09/06/2023   BUN 13 09/06/2023   CREATININE 0.93 09/06/2023   GFRNONAA >60 09/06/2023   CALCIUM 8.5 (L) 09/06/2023   PROT 7.1 09/06/2023   ALBUMIN 3.9 09/06/2023   LABGLOB 2.9 05/02/2023   AGRATIO 1.4 05/02/2023   BILITOT 0.6 09/06/2023   ALKPHOS 47 09/06/2023   AST 17 09/06/2023   ALT 13 09/06/2023   ANIONGAP 9 09/06/2023   Last lipids Lab Results  Component Value Date   CHOL 177 03/03/2022   HDL 48 03/03/2022   LDLCALC 107 (H) 03/03/2022   TRIG 124 03/03/2022   CHOLHDL 3.7 03/03/2022    Last hemoglobin A1c No results found for: "HGBA1C" Last thyroid functions Lab Results  Component Value Date   TSH 1.740 03/03/2022   T4TOTAL 7.4 03/03/2022   Last vitamin D No results found for: "25OHVITD2", "25OHVITD3", "VD25OH" Last vitamin B12 and Folate Lab Results  Component Value Date   VITAMINB12 172 (L) 05/02/2023   FOLATE 8.1 05/02/2023        Assessment & Plan:    Routine Health Maintenance and Physical Exam   There is no immunization history on file for this patient.  Health Maintenance  Topic Date Due   COVID-19 Vaccine (1) 11/18/2023 (Originally [birth year]26/1990)   INFLUENZA VACCINE  02/18/2024 (Originally 06/21/2023)   DTaP/Tdap/Td (1 - Tdap) 10/15/2024 (Originally 05/26/2004)   Hepatitis C Screening  10/15/2024 (Originally 05/27/2003)  HIV Screening  10/15/2024 (Originally 05/26/2000)   HPV VACCINES  Aged Out    Discussed health benefits of physical activity, and encouraged her to engage in regular exercise appropriate for her age and condition.  Problem List Items Addressed This Visit   None Visit Diagnoses       Annual physical exam    -  Primary   Relevant Orders   Anemia Profile B   CMP14+EGFR   Lipid panel   Thyroid Panel With TSH   Hepatitis C antibody   HIV Antibody (routine testing w rflx)     Screening for deficiency anemia       Relevant Orders   Anemia Profile B     Screening for endocrine disorder       Relevant Orders   CMP14+EGFR   Thyroid Panel With TSH     Screening for lipid disorders       Relevant Orders   Lipid panel     Vitamin D deficiency       Relevant Orders   CMP14+EGFR   VITAMIN D 25 Hydroxy (Vit-D Deficiency, Fractures)     Screening for HIV (human immunodeficiency virus)       Relevant Orders   HIV Antibody (routine testing w rflx)     Encounter for hepatitis C screening test for low risk patient       Relevant Orders   Hepatitis C antibody     Assessment and Plan    Intermittent Chest Pain Reports sharp chest  pain exacerbated by deep breathing or movement. No associated palpitations, jaw, neck, or arm pain. History of scoliosis may contribute to symptoms. - Monitor symptoms and follow up if she worsens or becomes more frequent  Allergic Rhinitis Intermittent shortness of breath and allergy symptoms managed with Claritin, Allegra, and Astepro nasal spray. Symptoms are controlled but persistent. - Continue Claritin, Allegra, and Astepro - Monitor symptoms and follow up if she worsens  Post-Hysterectomy Symptoms Experiencing hot flashes and vaginal discharge post-hysterectomy. Currently on estradiol patch. Ovaries appear normal on blood work. FSH and calcium levels checked; calcium slightly low. Ongoing hot flashes and discharge despite normal ovarian function. - Continue estradiol patch - Follow up with OBGYN as needed  Scoliosis Scoliosis with possible pinched nerve in the neck. Insurance denied MRI for upper back. Last MRI was of the lower back earlier this year. Insurance denial due to insufficient doctor visits. - Follow up with insurance regarding MRI approval - Continue current medications for scoliosis  General Health Maintenance Routine physical examination. Discussed family history of cancer, recent mammogram, and tetanus shot. No immediate family history of colon cancer. No history of diabetes or heart disease. Vision occasionally blurry, last eye exam was a while ago. Recent dental visit was satisfactory. Discussed sleep hygiene practices to improve sleep quality. - Administer tetanus shot - Order blood work including Hep C, HIV screening, vitamin B12, cholesterol, blood counts, anemia profile, thyroid function, and vitamin D - Recommend follow-up with eye doctor for vision changes - Encourage sleep hygiene practices (reduce screen time before bed)  Follow-up - Schedule follow-up appointment in one year unless symptoms necessitate earlier visit.       Return in about 1 year (around  11/01/2024), or if symptoms worsen or fail to improve, for CPE.     Kari Baars, FNP

## 2023-11-03 LAB — ANEMIA PROFILE B
Basophils Absolute: 0.1 10*3/uL (ref 0.0–0.2)
Basos: 1 %
EOS (ABSOLUTE): 0.2 10*3/uL (ref 0.0–0.4)
Eos: 3 %
Ferritin: 28 ng/mL (ref 15–150)
Folate: 11.5 ng/mL (ref >3.0)
Hematocrit: 43.8 % (ref 34.0–46.6)
Hemoglobin: 14.4 g/dL (ref 11.1–15.9)
Immature Grans (Abs): 0 10*3/uL (ref 0.0–0.1)
Immature Granulocytes: 0 %
Iron Saturation: 30 % (ref 15–55)
Iron: 106 ug/dL (ref 27–159)
Lymphocytes Absolute: 2.9 10*3/uL (ref 0.7–3.1)
Lymphs: 42 %
MCH: 31.6 pg (ref 26.6–33.0)
MCHC: 32.9 g/dL (ref 31.5–35.7)
MCV: 96 fL (ref 79–97)
Monocytes Absolute: 0.5 10*3/uL (ref 0.1–0.9)
Monocytes: 7 %
Neutrophils Absolute: 3.3 10*3/uL (ref 1.4–7.0)
Neutrophils: 47 %
Platelets: 267 10*3/uL (ref 150–450)
RBC: 4.56 x10E6/uL (ref 3.77–5.28)
RDW: 12.4 % (ref 11.7–15.4)
Retic Ct Pct: 1.9 % (ref 0.6–2.6)
Total Iron Binding Capacity: 356 ug/dL (ref 250–450)
UIBC: 250 ug/dL (ref 131–425)
Vitamin B-12: 424 pg/mL (ref 232–1245)
WBC: 6.9 10*3/uL (ref 3.4–10.8)

## 2023-11-03 LAB — THYROID PANEL WITH TSH
Free Thyroxine Index: 2 (ref 1.2–4.9)
T3 Uptake Ratio: 26 % (ref 24–39)
T4, Total: 7.5 ug/dL (ref 4.5–12.0)
TSH: 1.13 u[IU]/mL (ref 0.450–4.500)

## 2023-11-03 LAB — CMP14+EGFR
ALT: 17 IU/L (ref 0–32)
AST: 21 IU/L (ref 0–40)
Albumin: 4.4 g/dL (ref 3.9–4.9)
Alkaline Phosphatase: 66 IU/L (ref 44–121)
BUN/Creatinine Ratio: 18 (ref 9–23)
BUN: 16 mg/dL (ref 6–20)
Bilirubin Total: 0.5 mg/dL (ref 0.0–1.2)
CO2: 21 mmol/L (ref 20–29)
Calcium: 9.3 mg/dL (ref 8.7–10.2)
Chloride: 103 mmol/L (ref 96–106)
Creatinine, Ser: 0.9 mg/dL (ref 0.57–1.00)
Globulin, Total: 2.8 g/dL (ref 1.5–4.5)
Glucose: 83 mg/dL (ref 70–99)
Potassium: 4.2 mmol/L (ref 3.5–5.2)
Sodium: 138 mmol/L (ref 134–144)
Total Protein: 7.2 g/dL (ref 6.0–8.5)
eGFR: 84 mL/min/{1.73_m2} (ref >59)

## 2023-11-03 LAB — LIPID PANEL
Cholesterol, Total: 207 mg/dL — ABNORMAL HIGH (ref 100–199)
HDL: 49 mg/dL (ref >39)
LDL CALC COMMENT:: 4.2 ratio (ref 0.0–4.4)
LDL Chol Calc (NIH): 149 mg/dL — ABNORMAL HIGH (ref 0–99)
Triglycerides: 52 mg/dL (ref 0–149)
VLDL Cholesterol Cal: 9 mg/dL (ref 5–40)

## 2023-11-03 LAB — VITAMIN D 25 HYDROXY (VIT D DEFICIENCY, FRACTURES): Vit D, 25-Hydroxy: 36.7 ng/mL (ref 30.0–100.0)

## 2023-11-03 LAB — HEPATITIS C ANTIBODY

## 2023-11-03 LAB — HIV ANTIBODY (ROUTINE TESTING W REFLEX)

## 2023-11-19 ENCOUNTER — Ambulatory Visit: Payer: Managed Care, Other (non HMO)

## 2023-11-19 ENCOUNTER — Ambulatory Visit (INDEPENDENT_AMBULATORY_CARE_PROVIDER_SITE_OTHER): Payer: Managed Care, Other (non HMO)

## 2023-11-19 DIAGNOSIS — E538 Deficiency of other specified B group vitamins: Secondary | ICD-10-CM | POA: Diagnosis not present

## 2023-11-19 NOTE — Progress Notes (Signed)
Patient is in office today for a nurse visit for B12 Injection. Patient Injection was given in the  Right deltoid. Patient tolerated injection well.

## 2023-11-23 ENCOUNTER — Ambulatory Visit: Payer: Managed Care, Other (non HMO) | Admitting: Internal Medicine

## 2023-11-23 NOTE — Progress Notes (Deleted)
 Lindsay Mccoy

## 2023-12-06 ENCOUNTER — Telehealth: Payer: Managed Care, Other (non HMO) | Admitting: Family Medicine

## 2023-12-06 ENCOUNTER — Encounter: Payer: Self-pay | Admitting: Family Medicine

## 2023-12-06 DIAGNOSIS — J0100 Acute maxillary sinusitis, unspecified: Secondary | ICD-10-CM

## 2023-12-06 MED ORDER — FLUTICASONE PROPIONATE 50 MCG/ACT NA SUSP: 2.0000 | Freq: Every day | NASAL | 6 refills | Status: AC

## 2023-12-06 MED ORDER — AMOXICILLIN-POT CLAVULANATE 875-125 MG PO TABS: 1.0000 | ORAL_TABLET | Freq: Two times a day (BID) | ORAL | 0 refills | Status: AC

## 2023-12-06 NOTE — Progress Notes (Signed)
Virtual Visit via Video   I connected with patient on 12/06/23 at 1300 by a video enabled telemedicine application and verified that I am speaking with the correct person using two identifiers.  Location patient: Home Location provider: [facility] Persons participating in the virtual visit: Patient and Provider  I discussed the limitations of evaluation and management by telemedicine and the availability of in person appointments. The patient expressed understanding and agreed to proceed.  Subjective:   HPI:  Pt presents today for  Chief Complaint  Patient presents with   Sinusitis   Patient complains of congestion, coryza, cough described as productive, facial pain, low grade fever, nasal congestion, post nasal drip, and productive cough with  yellow and tenacious colored sputum. Symptoms include achiness, congestion, coryza, facial pain, nasal congestion, post nasal drip, and productive cough with  yellow and tenacious colored sputum with chills. Onset of symptoms was 7 days ago, gradually worsening since that time. She is drinking plenty of fluids.  Past history is significant for asthma. Patient is non-smoker. She has tried Mucinex and over the counter sinus and cold medications without relief of symptoms. COVID negative.    Review of Systems  Constitutional:  Positive for chills and malaise/fatigue. Negative for diaphoresis, fever and weight loss.  HENT:  Positive for congestion, ear pain, sinus pain and sore throat. Negative for ear discharge, nosebleeds and tinnitus.   Eyes:  Negative for blurred vision, double vision and photophobia.  Respiratory:  Positive for cough and sputum production. Negative for hemoptysis, shortness of breath, wheezing and stridor.   Cardiovascular:  Negative for chest pain, palpitations, orthopnea, claudication, leg swelling and PND.  Skin:  Negative for itching and rash.  Neurological:  Positive for headaches. Negative  for dizziness, tingling, tremors, sensory change, speech change, focal weakness, seizures, loss of consciousness and weakness.  All other systems reviewed and are negative.    Patient Active Problem List   Diagnosis Date Noted   Menorrhagia with regular cycle 09/05/2023   Fibroids 09/05/2023   Dysmenorrhea 09/05/2023   Acute pelvic pain, female 09/05/2023   Adult ADHD 05/02/2023   Depression, recurrent (HCC) 05/02/2023   GERD without esophagitis 03/03/2022   Chronic high back pain 02/24/2021   Mild intermittent asthma without complication 02/24/2021   Scoliosis deformity of spine 02/24/2021    Social History   Tobacco Use   Smoking status: Former    Current packs/day: 0.00    Average packs/day: 0.5 packs/day for 20.0 years (10.0 ttl pk-yrs)    Types: Cigarettes    Start date: 10/06/1995    Quit date: 10/06/2015    Years since quitting: 8.1   Smokeless tobacco: Never  Substance Use Topics   Alcohol use: Yes    Comment: occassionally     Current Outpatient Medications:    amoxicillin-clavulanate (AUGMENTIN) 875-125 MG tablet, Take 1 tablet by mouth 2 (two) times daily for 7 days., Disp: 14 tablet, Rfl: 0   fluticasone (FLONASE) 50 MCG/ACT nasal spray, Place 2 sprays into both nostrils daily., Disp: 16 g, Rfl: 6   acetaminophen (TYLENOL) 500 MG tablet, Take 1,000 mg by mouth every 6 (six) hours as needed for moderate pain., Disp: , Rfl:    celecoxib (CELEBREX) 100 MG capsule, Take 100 mg by mouth 2 (two) times daily., Disp: , Rfl:    estradiol (VIVELLE-DOT) 0.1 MG/24HR patch, Place 1 patch (0.1 mg total) onto the skin 2 (two) times a week., Disp: 8 patch, Rfl: 12  fexofenadine (ALLEGRA) 180 MG tablet, Take 180 mg by mouth daily., Disp: , Rfl:    ketorolac (TORADOL) 10 MG tablet, Take 1 tablet (10 mg total) by mouth every 8 (eight) hours as needed., Disp: 15 tablet, Rfl: 0   lisdexamfetamine (VYVANSE) 30 MG capsule, Take 30 mg by mouth daily., Disp: , Rfl:    loratadine  (CLARITIN) 10 MG tablet, Take 10 mg by mouth at bedtime., Disp: , Rfl:    metroNIDAZOLE (METROGEL) 0.75 % vaginal gel, Place 1 Applicatorful vaginally at bedtime., Disp: 70 g, Rfl: 5   ondansetron (ZOFRAN-ODT) 8 MG disintegrating tablet, Take 1 tablet (8 mg total) by mouth every 8 (eight) hours as needed for nausea or vomiting., Disp: 8 tablet, Rfl: 0   pantoprazole (PROTONIX) 40 MG tablet, TAKE 1 TABLET BY MOUTH DAILY, Disp: 90 tablet, Rfl: 0   potassium chloride SA (KLOR-CON M) 20 MEQ tablet, Take 1 tablet (20 mEq total) by mouth daily., Disp: 60 tablet, Rfl: 0   prochlorperazine (COMPAZINE) 10 MG tablet, Take 1 tablet (10 mg total) by mouth every 6 (six) hours as needed for nausea or vomiting., Disp: 20 tablet, Rfl: 0   sertraline (ZOLOFT) 50 MG tablet, Take 50 mg by mouth daily., Disp: , Rfl:    sucralfate (CARAFATE) 1 g tablet, Take 1 tablet (1 g total) by mouth 4 (four) times daily -  with meals and at bedtime., Disp: 90 tablet, Rfl: 1  Current Facility-Administered Medications:    cyanocobalamin (VITAMIN B12) injection 1,000 mcg, 1,000 mcg, Intramuscular, Q30 days, Lindsay Masters, FNP, 1,000 mcg at 11/19/23 1505  No Known Allergies  Objective:   LMP 07/22/2023 (Approximate)   Patient is well-developed, well-nourished in no acute distress.  Resting comfortably at home.  Head is normocephalic, atraumatic.  No labored breathing.  Speech is clear and coherent with logical content.  Patient is alert and oriented at baseline.   Assessment and Plan:   Lindsay Mccoy was seen today for sinusitis.  Diagnoses and all orders for this visit:  Acute non-recurrent maxillary sinusitis Has tried and failed conservative management. Symptoms persistent and worsening, will treat with below. Pt aware to report new, worsening, or persistent symptoms.  -     amoxicillin-clavulanate (AUGMENTIN) 875-125 MG tablet; Take 1 tablet by mouth 2 (two) times daily for 7 days. -     fluticasone (FLONASE) 50 MCG/ACT  nasal spray; Place 2 sprays into both nostrils daily.      Return if symptoms worsen or fail to improve.  Lindsay Baars, FNP-C [facility] [address] [place], [place] [address] [phone]  12/06/2023  Time spent with the patient: 12 minutes, of which >50% was spent in obtaining information about symptoms, reviewing previous labs, evaluations, and treatments, counseling about condition (please see the discussed topics above), and developing a plan to further investigate it; had a number of questions which I addressed.

## 2023-12-09 ENCOUNTER — Encounter: Payer: Self-pay | Admitting: Family Medicine

## 2023-12-18 ENCOUNTER — Other Ambulatory Visit: Payer: Self-pay | Admitting: Obstetrics & Gynecology

## 2023-12-19 ENCOUNTER — Encounter: Payer: Self-pay | Admitting: Family Medicine

## 2023-12-19 ENCOUNTER — Ambulatory Visit: Payer: Managed Care, Other (non HMO) | Admitting: Family Medicine

## 2023-12-19 VITALS — BP 121/73 | HR 107 | Temp 98.0°F | Ht 64.0 in | Wt 144.0 lb

## 2023-12-19 DIAGNOSIS — R062 Wheezing: Secondary | ICD-10-CM

## 2023-12-19 DIAGNOSIS — B338 Other specified viral diseases: Secondary | ICD-10-CM

## 2023-12-19 MED ORDER — PREDNISONE 20 MG PO TABS: 40.0000 mg | ORAL_TABLET | Freq: Every day | ORAL | 0 refills | Status: AC

## 2023-12-19 MED ORDER — ALBUTEROL SULFATE HFA 108 (90 BASE) MCG/ACT IN AERS: 2.0000 | INHALATION_SPRAY | Freq: Four times a day (QID) | RESPIRATORY_TRACT | 2 refills | Status: DC | PRN

## 2023-12-19 NOTE — Progress Notes (Signed)
Subjective:  Patient ID: Lindsay Mccoy, female    DOB: [birth year], 38 y.o.   MRN: [id number]  Patient Care Team: Sonny Masters, FNP as PCP - General (Family Medicine) Mallipeddi, Orion Modest, MD as PCP - Cardiology (Cardiology)   Chief Complaint:  RSV (2 weeks of symptoms. SOB, chest pain, runny nose, sneezing, cough, hot and cold, and no appetite. Tested positive yesterday for RSV. )   HPI: Lindsay Mccoy is a 38 y.o. female presenting on 12/19/2023 for RSV (2 weeks of symptoms. SOB, chest pain, runny nose, sneezing, cough, hot and cold, and no appetite. Tested positive yesterday for RSV. )   Discussed the use of AI scribe software for clinical note transcription with the patient, who gave verbal consent to proceed.  History of Present Illness   The patient presents with worsening respiratory symptoms following a recent diagnosis of flu and RSV.  She has been experiencing worsening respiratory symptoms since her initial diagnosis of flu and RSV. She tested positive for both conditions at [facility], but no antiviral treatment was initiated due to the timing of symptom onset. Her symptoms include breathing difficulties, such as shortness of breath and wheezing.  She has been taking Mucinex to manage mucus production and staying hydrated. She completed a course of antibiotics previously prescribed but has not taken prednisone. She has been using an inhaler that is about two years old.  It has been 18 days since the onset of her symptoms. She reports fluctuations in body temperature, feeling 'hot and cold', but has not measured her temperature recently and has not had a fever for a while.  She mentions being sick for the entire month of December, and her daughter was sick in November and December. She believes she contracted the illness from her daughter. She has been staying with her cousin to avoid spreading the illness to her 59 year old mother.          Relevant past medical, surgical,  family, and social history reviewed and updated as indicated.  Allergies and medications reviewed and updated. Data reviewed: Chart in Epic.   Past Medical History:  Diagnosis Date   Bronchitis    Gastritis    GERD (gastroesophageal reflux disease)    Scoliosis     Past Surgical History:  Procedure Laterality Date   ROBOTIC ASSISTED TOTAL HYSTERECTOMY WITH BILATERAL SALPINGO OOPHERECTOMY Bilateral 09/05/2023   Procedure: XI ROBOTIC ASSISTED TOTAL HYSTERECTOMY WITH BILATERAL SALPINGECTOMY;  Surgeon: Lazaro Arms, MD;  Location: [facility];  Service: Gynecology;  Laterality: Bilateral;   UPPER GI ENDOSCOPY      Social History   Socioeconomic History   Marital status: Married    Spouse name: Not on file   Number of children: Not on file   Years of education: Not on file   Highest education level: Associate degree: academic program  Occupational History   Not on file  Tobacco Use   Smoking status: Former    Current packs/day: 0.00    Average packs/day: 0.5 packs/day for 20.0 years (10.0 ttl pk-yrs)    Types: Cigarettes    Start date: 10/06/1995    Quit date: 10/06/2015    Years since quitting: 8.2   Smokeless tobacco: Never  Vaping Use   Vaping status: Every Day  Substance and Sexual Activity   Alcohol use: Yes    Comment: occassionally    Drug use: Never   Sexual activity: Yes    Birth control/protection: Surgical  Other Topics Concern  Not on file  Social History Narrative   Not on file   Social Drivers of Health   Financial Resource Strain: Low Risk  (12/06/2023)   Overall Financial Resource Strain (CARDIA)    Difficulty of Paying Living Expenses: Not hard at all  Food Insecurity: No Food Insecurity (12/06/2023)   Hunger Vital Sign    Worried About Running Out of Food in the Last Year: Never true    Ran Out of Food in the Last Year: Never true  Transportation Needs: No Transportation Needs (12/06/2023)   PRAPARE - Administrator, Civil Service  (Medical): No    Lack of Transportation (Non-Medical): No  Physical Activity: Insufficiently Active (12/06/2023)   Exercise Vital Sign    Days of Exercise per Week: 2 days    Minutes of Exercise per Session: 10 min  Stress: No Stress Concern Present (12/06/2023)   [employer] of Occupational Health - Occupational Stress Questionnaire    Feeling of Stress : Not at all  Social Connections: Moderately Integrated (12/06/2023)   Social Connection and Isolation Panel [NHANES]    Frequency of Communication with Friends and Family: More than three times a week    Frequency of Social Gatherings with Friends and Family: Once a week    Attends Religious Services: More than 4 times per year    Active Member of [employer] or Organizations: No    Attends Engineer, structural: Not on file    Marital Status: Married  Intimate Partner Violence: Unknown (02/23/2022)   Received from [employer], [facility]   HITS    Physically Hurt: Not on file    Insult or Talk Down To: Not on file    Threaten Physical Harm: Not on file    Scream or Curse: Not on file    Outpatient Encounter Medications as of 12/19/2023  Medication Sig   acetaminophen (TYLENOL) 500 MG tablet Take 1,000 mg by mouth every 6 (six) hours as needed for moderate pain.   albuterol (VENTOLIN HFA) 108 (90 Base) MCG/ACT inhaler Inhale 2 puffs into the lungs every 6 (six) hours as needed for wheezing or shortness of breath.   celecoxib (CELEBREX) 100 MG capsule Take 100 mg by mouth 2 (two) times daily.   estradiol (VIVELLE-DOT) 0.1 MG/24HR patch Place 1 patch (0.1 mg total) onto the skin 2 (two) times a week.   fexofenadine (ALLEGRA) 180 MG tablet Take 180 mg by mouth daily.   fluticasone (FLONASE) 50 MCG/ACT nasal spray Place 2 sprays into both nostrils daily.   ketorolac (TORADOL) 10 MG tablet Take 1 tablet (10 mg total) by mouth every 8 (eight) hours as needed.   lisdexamfetamine (VYVANSE) 30 MG capsule Take 30 mg by mouth daily.    loratadine (CLARITIN) 10 MG tablet Take 10 mg by mouth at bedtime.   metroNIDAZOLE (METROGEL) 0.75 % vaginal gel Place 1 Applicatorful vaginally at bedtime.   ondansetron (ZOFRAN-ODT) 8 MG disintegrating tablet Take 1 tablet (8 mg total) by mouth every 8 (eight) hours as needed for nausea or vomiting.   pantoprazole (PROTONIX) 40 MG tablet TAKE 1 TABLET BY MOUTH DAILY   potassium chloride SA (KLOR-CON M) 20 MEQ tablet Take 1 tablet (20 mEq total) by mouth daily.   predniSONE (DELTASONE) 20 MG tablet Take 2 tablets (40 mg total) by mouth daily with breakfast for 5 days.   prochlorperazine (COMPAZINE) 10 MG tablet Take 1 tablet (10 mg total) by mouth every 6 (six) hours as needed  for nausea or vomiting.   sertraline (ZOLOFT) 50 MG tablet Take 50 mg by mouth daily.   sucralfate (CARAFATE) 1 g tablet Take 1 tablet (1 g total) by mouth 4 (four) times daily -  with meals and at bedtime.   [DISCONTINUED] omeprazole (PRILOSEC) 20 MG capsule Take 1 capsule (20 mg total) by mouth 2 (two) times daily before a meal.   Facility-Administered Encounter Medications as of 12/19/2023  Medication   cyanocobalamin (VITAMIN B12) injection 1,000 mcg    No Known Allergies  Pertinent ROS per HPI, otherwise unremarkable      Objective:  BP 121/73   Pulse (!) 107   Temp 98 F (36.7 C)   Ht 5\' 4"  (1.626 m)   Wt 144 lb (65.3 kg)   LMP 07/22/2023 (Approximate)   SpO2 95%   BMI 24.72 kg/m    Wt Readings from Last 3 Encounters:  12/19/23 144 lb (65.3 kg)  11/02/23 135 lb 9.6 oz (61.5 kg)  10/23/23 135 lb (61.2 kg)    Physical Exam Vitals and nursing note reviewed.  Constitutional:      Appearance: Normal appearance.  HENT:     Head: Normocephalic and atraumatic.     Nose: Nose normal.     Mouth/Throat:     Mouth: Mucous membranes are moist.     Pharynx: Oropharynx is clear.  Eyes:     Conjunctiva/sclera: Conjunctivae normal.     Pupils: Pupils are equal, round, and reactive to light.   Cardiovascular:     Rate and Rhythm: Normal rate and regular rhythm.     Heart sounds: Normal heart sounds.  Pulmonary:     Effort: Pulmonary effort is normal.     Breath sounds: Wheezing present. No rhonchi or rales.  Musculoskeletal:     Comments: Scoliosis  Skin:    General: Skin is warm and dry.     Capillary Refill: Capillary refill takes less than 2 seconds.  Neurological:     General: No focal deficit present.     Mental Status: She is alert and oriented to person, place, and time.  Psychiatric:        Mood and Affect: Mood normal.        Behavior: Behavior normal.        Thought Content: Thought content normal.        Judgment: Judgment normal.       Results for orders placed or performed in visit on 11/02/23  Anemia Profile B   Collection Time: 11/02/23 10:36 AM  Result Value Ref Range   Total Iron Binding Capacity 356 250 - 450 ug/dL   UIBC 295 621 - 308 ug/dL   Iron 657 27 - 846 ug/dL   Iron Saturation 30 15 - 55 %   Ferritin 28 15 - 150 ng/mL   Vitamin B-12 424 232 - 1,245 pg/mL   Folate 11.5 >3.0 ng/mL   WBC 6.9 3.4 - 10.8 x10E3/uL   RBC 4.56 3.77 - 5.28 x10E6/uL   Hemoglobin 14.4 11.1 - 15.9 g/dL   Hematocrit 96.2 95.2 - 46.6 %   MCV 96 79 - 97 fL   MCH 31.6 26.6 - 33.0 pg   MCHC 32.9 31.5 - 35.7 g/dL   RDW 84.1 32.4 - 40.1 %   Platelets 267 150 - 450 x10E3/uL   Neutrophils 47 Not Estab. %   Lymphs 42 Not Estab. %   Monocytes 7 Not Estab. %   Eos 3 Not Estab. %  Basos 1 Not Estab. %   Neutrophils Absolute 3.3 1.4 - 7.0 x10E3/uL   Lymphocytes Absolute 2.9 0.7 - 3.1 x10E3/uL   Monocytes Absolute 0.5 0.1 - 0.9 x10E3/uL   EOS (ABSOLUTE) 0.2 0.0 - 0.4 x10E3/uL   Basophils Absolute 0.1 0.0 - 0.2 x10E3/uL   Immature Granulocytes 0 Not Estab. %   Immature Grans (Abs) 0.0 0.0 - 0.1 x10E3/uL   Retic Ct Pct 1.9 0.6 - 2.6 %  CMP14+EGFR   Collection Time: 11/02/23 10:36 AM  Result Value Ref Range   Glucose 83 70 - 99 mg/dL   BUN 16 6 - 20 mg/dL    Creatinine, Ser 7.56 0.57 - 1.00 mg/dL   eGFR 84 >43 PI/RJJ/8.84   BUN/Creatinine Ratio 18 9 - 23   Sodium 138 134 - 144 mmol/L   Potassium 4.2 3.5 - 5.2 mmol/L   Chloride 103 96 - 106 mmol/L   CO2 21 20 - 29 mmol/L   Calcium 9.3 8.7 - 10.2 mg/dL   Total Protein 7.2 6.0 - 8.5 g/dL   Albumin 4.4 3.9 - 4.9 g/dL   Globulin, Total 2.8 1.5 - 4.5 g/dL   Bilirubin Total 0.5 0.0 - 1.2 mg/dL   Alkaline Phosphatase 66 44 - 121 IU/L   AST 21 0 - 40 IU/L   ALT 17 0 - 32 IU/L  Lipid panel   Collection Time: 11/02/23 10:36 AM  Result Value Ref Range   Cholesterol, Total 207 (H) 100 - 199 mg/dL   Triglycerides 52 0 - 149 mg/dL   HDL 49 >16 mg/dL   VLDL Cholesterol Cal 9 5 - 40 mg/dL   LDL Chol Calc (NIH) 606 (H) 0 - 99 mg/dL   Chol/HDL Ratio 4.2 0.0 - 4.4 ratio  Thyroid Panel With TSH   Collection Time: 11/02/23 10:36 AM  Result Value Ref Range   TSH 1.130 0.450 - 4.500 uIU/mL   T4, Total 7.5 4.5 - 12.0 ug/dL   T3 Uptake Ratio 26 24 - 39 %   Free Thyroxine Index 2.0 1.2 - 4.9  VITAMIN D 25 Hydroxy (Vit-D Deficiency, Fractures)   Collection Time: 11/02/23 10:36 AM  Result Value Ref Range   Vit D, 25-Hydroxy 36.7 30.0 - 100.0 ng/mL  Hepatitis C antibody   Collection Time: 11/02/23 10:36 AM  Result Value Ref Range   Hep C Virus Ab Non Reactive Non Reactive  HIV Antibody (routine testing w rflx)   Collection Time: 11/02/23 10:36 AM  Result Value Ref Range   HIV Screen 4th Generation wRfx Non Reactive Non Reactive       Pertinent labs & imaging results that were available during my care of the patient were reviewed by me and considered in my medical decision making.  Assessment & Plan:  Zaina was seen today for rsv.  Diagnoses and all orders for this visit:  RSV (respiratory syncytial virus infection) -     predniSONE (DELTASONE) 20 MG tablet; Take 2 tablets (40 mg total) by mouth daily with breakfast for 5 days. -     albuterol (VENTOLIN HFA) 108 (90 Base) MCG/ACT inhaler; Inhale 2  puffs into the lungs every 6 (six) hours as needed for wheezing or shortness of breath.  Wheezing -     predniSONE (DELTASONE) 20 MG tablet; Take 2 tablets (40 mg total) by mouth daily with breakfast for 5 days. -     albuterol (VENTOLIN HFA) 108 (90 Base) MCG/ACT inhaler; Inhale 2 puffs into the lungs every 6 (  six) hours as needed for wheezing or shortness of breath.     Assessment and Plan    Respiratory Syncytial Virus (RSV) Infection RSV infection confirmed by previous testing. Symptoms include significant mucus production, wheezing, and shortness of breath for over 18 days, suggesting no longer contagious. No recent fever. Using outdated inhaler and taking Mucinex. Discussed that prednisone and albuterol inhaler will help alleviate symptoms. Emphasized the importance of hydration to thin mucus. - Prescribe prednisone, 2 pills once a day for 5 days. - Prescribe albuterol inhaler, to be used every 4-6 hours as needed for cough, shortness of breath, and wheezing. - Continue taking Mucinex twice a day. - Advise to drink plenty of water to help thin mucus. - Send a message if symptoms worsen or fever develops for a potential second round of antibiotics.  Influenza Positive for influenza as per previous testing. Antivirals not started due to diagnosis delay beyond 48 hours of symptom onset, as studies show limited benefit and significant side effects if started late. - No specific antiviral treatment recommended due to the timing of diagnosis.  General Health Maintenance Discussed the importance of staying hydrated and avoiding contact with vulnerable individuals to prevent the spread of infection. - Advise to stay hydrated. - Advise to avoid contact with vulnerable individuals until fully recovered.          Continue all other maintenance medications.  Follow up plan: Return if symptoms worsen or fail to improve.   Continue healthy lifestyle choices, including diet (rich in  fruits, vegetables, and lean proteins, and low in salt and simple carbohydrates) and exercise (at least 30 minutes of moderate physical activity daily).    The above assessment and management plan was discussed with the patient. The patient verbalized understanding of and has agreed to the management plan. Patient is aware to call the clinic if they develop any new symptoms or if symptoms persist or worsen. Patient is aware when to return to the clinic for a follow-up visit. Patient educated on when it is appropriate to go to the emergency department.   Kari Baars, FNP-C [facility] [phone]

## 2023-12-21 ENCOUNTER — Ambulatory Visit: Payer: Managed Care, Other (non HMO)

## 2023-12-25 ENCOUNTER — Ambulatory Visit: Payer: Managed Care, Other (non HMO)

## 2023-12-27 ENCOUNTER — Other Ambulatory Visit: Payer: Self-pay | Admitting: Family Medicine

## 2023-12-27 DIAGNOSIS — D500 Iron deficiency anemia secondary to blood loss (chronic): Secondary | ICD-10-CM

## 2023-12-27 DIAGNOSIS — E538 Deficiency of other specified B group vitamins: Secondary | ICD-10-CM

## 2023-12-28 ENCOUNTER — Ambulatory Visit: Payer: Managed Care, Other (non HMO)

## 2023-12-30 ENCOUNTER — Other Ambulatory Visit: Payer: Self-pay | Admitting: Family Medicine

## 2023-12-30 DIAGNOSIS — K219 Gastro-esophageal reflux disease without esophagitis: Secondary | ICD-10-CM

## 2024-01-01 ENCOUNTER — Ambulatory Visit: Payer: Managed Care, Other (non HMO) | Admitting: Family Medicine

## 2024-01-02 ENCOUNTER — Ambulatory Visit: Payer: Managed Care, Other (non HMO) | Admitting: Family Medicine

## 2024-01-06 ENCOUNTER — Other Ambulatory Visit: Payer: Self-pay | Admitting: Family Medicine

## 2024-01-06 DIAGNOSIS — D500 Iron deficiency anemia secondary to blood loss (chronic): Secondary | ICD-10-CM

## 2024-01-06 DIAGNOSIS — E538 Deficiency of other specified B group vitamins: Secondary | ICD-10-CM

## 2024-01-07 ENCOUNTER — Telehealth: Payer: Self-pay | Admitting: Family Medicine

## 2024-01-07 ENCOUNTER — Other Ambulatory Visit: Payer: Self-pay | Admitting: Family Medicine

## 2024-01-07 DIAGNOSIS — M4125 Other idiopathic scoliosis, thoracolumbar region: Secondary | ICD-10-CM

## 2024-01-07 DIAGNOSIS — G8929 Other chronic pain: Secondary | ICD-10-CM

## 2024-01-07 NOTE — Telephone Encounter (Signed)
 Please advise.    Copied from CRM [id number]. Topic: Referral - Request for Referral >> Jan 07, 2024 10:22 AM Fuller Mandril wrote: Did the patient discuss referral with their provider in the last year? Yes (If No - schedule appointment) (If Yes - send message)  Appointment offered? No  Type of order/referral and detailed reason for visit: Pain Management - was recently discharged from the last one that she was referred to. They told her to reach out to primary care for new referral to different provider.   Preference of office, provider, location:  If referral order, have you been seen by this specialty before? Yes (If Yes, this issue or another issue? When? Where? [facility] [place], last seen December    Can we respond through MyChart? Yes

## 2024-01-16 ENCOUNTER — Ambulatory Visit: Payer: Managed Care, Other (non HMO) | Admitting: Family Medicine

## 2024-01-16 ENCOUNTER — Encounter: Payer: Self-pay | Admitting: Family Medicine

## 2024-01-21 ENCOUNTER — Other Ambulatory Visit: Payer: Self-pay | Admitting: Obstetrics & Gynecology

## 2024-01-22 ENCOUNTER — Other Ambulatory Visit: Payer: Self-pay | Admitting: Family Medicine

## 2024-01-22 ENCOUNTER — Encounter: Payer: Self-pay | Admitting: Family Medicine

## 2024-01-22 DIAGNOSIS — B370 Candidal stomatitis: Secondary | ICD-10-CM

## 2024-01-22 MED ORDER — NYSTATIN 100000 UNIT/ML MT SUSP: 5.0000 mL | Freq: Four times a day (QID) | OROMUCOSAL | 0 refills | Status: DC

## 2024-01-24 ENCOUNTER — Other Ambulatory Visit: Payer: Self-pay | Admitting: Family Medicine

## 2024-01-24 ENCOUNTER — Telehealth: Payer: Self-pay

## 2024-01-24 DIAGNOSIS — B370 Candidal stomatitis: Secondary | ICD-10-CM

## 2024-01-24 NOTE — Telephone Encounter (Signed)
 Copied from CRM [id number]. Topic: Clinical - Medication Refill >> Jan 24, 2024 10:50 AM Gery Pray wrote: Most Recent Primary Care Visit:  Provider: Sonny Masters  Department: [facility]  Visit Type: OFFICE VISIT  Date: 12/19/2023  Medication: nystatin (MYCOSTATIN) 100000 UNIT/ML suspension  Has the patient contacted their pharmacy? Yes (Agent: If no, request that the patient contact the pharmacy for the refill. If patient does not wish to contact the pharmacy document the reason why and proceed with request.) (Agent: If yes, when and what did the pharmacy advise?) Pharmacy advised to call in for another refill as the initial medicaition was not enough to last the 7 days as asked by the provider  Is this the correct pharmacy for this prescription? Yes If no, delete pharmacy and type the correct one.  This is the patient's preferred pharmacy:  [id number] - [place], [place] - [address] [place] [address] Phone: [phone] Fax: [phone]  Has the prescription been filled recently? Yes  Is the patient out of the medication? Yes  Has the patient been seen for an appointment in the last year OR does the patient have an upcoming appointment? Yes  Can we respond through MyChart? Yes  Agent: Please be advised that Rx refills may take up to 3 business days. We ask that you follow-up with your pharmacy.

## 2024-01-24 NOTE — Telephone Encounter (Signed)
Med sent in by provider

## 2024-01-25 ENCOUNTER — Other Ambulatory Visit: Payer: Self-pay | Admitting: Family Medicine

## 2024-01-25 ENCOUNTER — Ambulatory Visit: Payer: Self-pay | Admitting: Family Medicine

## 2024-01-25 DIAGNOSIS — B370 Candidal stomatitis: Secondary | ICD-10-CM

## 2024-01-25 MED ORDER — NYSTATIN 100000 UNIT/ML MT SUSP: 5.0000 mL | Freq: Four times a day (QID) | OROMUCOSAL | 0 refills | Status: DC

## 2024-01-25 NOTE — Telephone Encounter (Signed)
 On nystatin for oral thrush. Received 3 of 7 days supply, pending remaining medication. Requesting recommendations for otc pain relief. Advised message will be sent to provider for follow up on remaining prescription medication and pain relief recommended.   Copied from CRM [id number]. Topic: Clinical - Medical Advice >> Jan 25, 2024  1:20 PM Everette C wrote: Reason for CRM: The patient is concerned with the amount of nystatin (MYCOSTATIN) 100000 UNIT/ML suspension [[id number]] that they've been prescribed recently and would also like to discuss otc alternatives to help with their oral discomfort Reason for Disposition  All other mouth symptoms (Exceptions: dry mouth from not drinking enough liquids, chapped lips)  Answer Assessment - Initial Assessment Questions 1. SYMPTOM: "What's the main symptom you're concerned about?" (e.g., chapped lips, dry mouth, lump, sores)     Mouth pain  2.  PAIN: "Is there any pain?" If Yes, ask: "How bad is it?" (Scale: 1-10; mild, moderate, severe)   - MILD (1-3):  doesn't interfere with eating or normal activities   - MODERATE (4-7): interferes with eating some solids and normal activities   - SEVERE (8-10):  excruciating pain, interferes with most normal activities   - SEVERE DYSPHAGIA: can't swallow liquids, drooling     Intermittent, mild pain.  4. CAUSE: "What do you think is causing the symptoms?"     Thrush infection diagnosed  5. OTHER SYMPTOMS: "Do you have any other symptoms?" (e.g., fever, sore throat, toothache, swelling)     Denies  Protocols used: Mouth Symptoms-A-AH

## 2024-01-30 ENCOUNTER — Ambulatory Visit: Payer: Self-pay | Admitting: Family Medicine

## 2024-01-30 NOTE — Telephone Encounter (Signed)
  Chief Complaint: pain Symptoms: pain in mouth Frequency: about a week Pertinent Negatives: Patient denies sob, fever Disposition: [] ED /[] Urgent Care (no appt availability in office) / [x] Appointment(In office/virtual)/ []  [facility]/ [] Home Care/ [] Refused Recommended Disposition /[] [facility]/ []  Follow-up with PCP Additional Notes: Patient states that the pain in her mouth is not any better despite taking the nystatin.  Patient states that she is not really able to eat because of the pain but is able to drink water. States that she is brushing as using medication as prescribed.    Copied from CRM [id number]. Topic: Clinical - Red Word Triage >> Jan 30, 2024  2:46 PM [place] B wrote: [facility] that prompted transfer to Nurse Triage: pt states med, given not helping for pain in mouth with thrush Reason for Disposition  [1] MILD-MODERATE mouth pain AND [2] present > 3 days  Answer Assessment - Initial Assessment Questions 1. ONSET: "When did the mouth start hurting?" (e.g., hours or days ago)      Last week 2. SEVERITY: "How bad is the pain?" (Scale 1-10; mild, moderate or severe)   - MILD (1-3):  doesn't interfere with eating or normal activities   - MODERATE (4-7): interferes with eating some solids and normal activities   - SEVERE (8-10):  excruciating pain, interferes with most normal activities   - SEVERE DYSPHAGIA: can't swallow liquids, drooling     moderate 3. SORES: "Are there any sores or ulcers in the mouth?" If Yes, ask: "What part of the mouth are the sores in?"     no 4. FEVER: "Do you have a fever?" If Yes, ask: "What is your temperature, how was it measured, and when did it start?"     Dont think so 5. CAUSE: "What do you think is causing the mouth pain?"     thrush 6. OTHER SYMPTOMS: "Do you have any other symptoms?" (e.g., difficulty breathing)     Trouble swallowing due to pain  Protocols used: Mouth Pain-A-AH

## 2024-01-30 NOTE — Telephone Encounter (Signed)
 Has appointment

## 2024-01-31 ENCOUNTER — Ambulatory Visit (INDEPENDENT_AMBULATORY_CARE_PROVIDER_SITE_OTHER): Admitting: Family Medicine

## 2024-01-31 ENCOUNTER — Encounter: Payer: Self-pay | Admitting: Family Medicine

## 2024-01-31 VITALS — BP 129/85 | HR 84 | Temp 97.4°F | Ht 64.0 in | Wt 126.6 lb

## 2024-01-31 DIAGNOSIS — R634 Abnormal weight loss: Secondary | ICD-10-CM

## 2024-01-31 DIAGNOSIS — B370 Candidal stomatitis: Secondary | ICD-10-CM

## 2024-01-31 LAB — BAYER DCA HB A1C WAIVED: HB A1C (BAYER DCA - WAIVED): 5.1 % (ref 4.8–5.6)

## 2024-01-31 MED ORDER — CLOTRIMAZOLE 10 MG MT TROC: 10.0000 mg | Freq: Every day | OROMUCOSAL | 0 refills | Status: AC

## 2024-01-31 NOTE — Progress Notes (Signed)
 Subjective:  Patient ID: Lindsay Mccoy, female    DOB: 02-18-1986, 38 y.o.   MRN: [id number]  Patient Care Team: Sonny Masters, FNP as PCP - General (Family Medicine) Marjo Bicker, MD as PCP - Cardiology (Cardiology)   Chief Complaint:  Oral Pain (Medication sent in on 3/4 but states it is no better.)   HPI: Lindsay Mccoy is a 38 y.o. female presenting on 01/31/2024 for Oral Pain (Medication sent in on 3/4 but states it is no better.)   Discussed the use of AI scribe software for clinical note transcription with the patient, who gave verbal consent to proceed.  History of Present Illness   The patient presents with persistent oral thrush and associated weight loss.  She has been experiencing persistent oral thrush since January 22, 2024, which has not improved with the use of nystatin. Her mouth is painful to the touch, making it difficult to eat or drink, and she experiences increased saliva production and a sensation of congestion in her mouth.  She notes significant weight loss, which she attributes to her inability to eat and drink due to the oral thrush. Her weight was previously in the 'one thirty something' range, and she is concerned about the current weight loss. Her diet has been limited to fluids and 'easy stuff and cold stuff' due to the discomfort and unpleasant taste associated with eating and drinking.  She recalls being slightly anemic last year, which is a concern given her current symptoms and weight loss.          Relevant past medical, surgical, family, and social history reviewed and updated as indicated.  Allergies and medications reviewed and updated. Data reviewed: Chart in Epic.   Past Medical History:  Diagnosis Date   Bronchitis    Gastritis    GERD (gastroesophageal reflux disease)    Scoliosis     Past Surgical History:  Procedure Laterality Date   ROBOTIC ASSISTED TOTAL HYSTERECTOMY WITH BILATERAL SALPINGO OOPHERECTOMY Bilateral 09/05/2023    Procedure: XI ROBOTIC ASSISTED TOTAL HYSTERECTOMY WITH BILATERAL SALPINGECTOMY;  Surgeon: Lazaro Arms, MD;  Location: [facility];  Service: Gynecology;  Laterality: Bilateral;   UPPER GI ENDOSCOPY      Social History   Socioeconomic History   Marital status: Married    Spouse name: Not on file   Number of children: Not on file   Years of education: Not on file   Highest education level: Associate degree: academic program  Occupational History   Not on file  Tobacco Use   Smoking status: Former    Current packs/day: 0.00    Average packs/day: 0.5 packs/day for 20.0 years (10.0 ttl pk-yrs)    Types: Cigarettes    Start date: 10/06/1995    Quit date: 10/06/2015    Years since quitting: 8.3   Smokeless tobacco: Never  Vaping Use   Vaping status: Every Day  Substance and Sexual Activity   Alcohol use: Yes    Comment: occassionally    Drug use: Never   Sexual activity: Yes    Birth control/protection: Surgical  Other Topics Concern   Not on file  Social History Narrative   Not on file   Social Drivers of Health   Financial Resource Strain: Low Risk  (12/06/2023)   Overall Financial Resource Strain (CARDIA)    Difficulty of Paying Living Expenses: Not hard at all  Food Insecurity: No Food Insecurity (12/06/2023)   Hunger Vital Sign    Worried  About Running Out of Food in the Last Year: Never true    Ran Out of Food in the Last Year: Never true  Transportation Needs: No Transportation Needs (12/06/2023)   PRAPARE - Administrator, Civil Service (Medical): No    Lack of Transportation (Non-Medical): No  Physical Activity: Insufficiently Active (12/06/2023)   Exercise Vital Sign    Days of Exercise per Week: 2 days    Minutes of Exercise per Session: 10 min  Stress: No Stress Concern Present (12/06/2023)   [employer] of Occupational Health - Occupational Stress Questionnaire    Feeling of Stress : Not at all  Social Connections: Moderately Integrated  (12/06/2023)   Social Connection and Isolation Panel [NHANES]    Frequency of Communication with Friends and Family: More than three times a week    Frequency of Social Gatherings with Friends and Family: Once a week    Attends Religious Services: More than 4 times per year    Active Member of [employer] or Organizations: No    Attends Engineer, structural: Not on file    Marital Status: Married  Intimate Partner Violence: Unknown (02/23/2022)   Received from [employer], [facility]   HITS    Physically Hurt: Not on file    Insult or Talk Down To: Not on file    Threaten Physical Harm: Not on file    Scream or Curse: Not on file    Outpatient Encounter Medications as of 01/31/2024  Medication Sig   acetaminophen (TYLENOL) 500 MG tablet Take 1,000 mg by mouth every 6 (six) hours as needed for moderate pain.   albuterol (VENTOLIN HFA) 108 (90 Base) MCG/ACT inhaler Inhale 2 puffs into the lungs every 6 (six) hours as needed for wheezing or shortness of breath.   celecoxib (CELEBREX) 100 MG capsule Take 100 mg by mouth 2 (two) times daily.   clotrimazole (MYCELEX) 10 MG troche Take 1 tablet (10 mg total) by mouth 5 (five) times daily for 10 days.   estradiol (VIVELLE-DOT) 0.1 MG/24HR patch Place 1 patch (0.1 mg total) onto the skin 2 (two) times a week.   fexofenadine (ALLEGRA) 180 MG tablet Take 180 mg by mouth daily.   fluticasone (FLONASE) 50 MCG/ACT nasal spray Place 2 sprays into both nostrils daily.   ketorolac (TORADOL) 10 MG tablet Take 1 tablet (10 mg total) by mouth every 8 (eight) hours as needed.   lisdexamfetamine (VYVANSE) 30 MG capsule Take 30 mg by mouth daily.   loratadine (CLARITIN) 10 MG tablet Take 10 mg by mouth at bedtime.   metroNIDAZOLE (METROGEL) 0.75 % vaginal gel Place 1 Applicatorful vaginally at bedtime.   ondansetron (ZOFRAN-ODT) 8 MG disintegrating tablet DISSOLVE 1 TABLET(8 MG) ON THE TONGUE EVERY 8 HOURS AS NEEDED FOR NAUSEA OR VOMITING   pantoprazole  (PROTONIX) 40 MG tablet TAKE 1 TABLET BY MOUTH DAILY   potassium chloride SA (KLOR-CON M) 20 MEQ tablet Take 1 tablet (20 mEq total) by mouth daily.   prochlorperazine (COMPAZINE) 10 MG tablet Take 1 tablet (10 mg total) by mouth every 6 (six) hours as needed for nausea or vomiting.   sertraline (ZOLOFT) 50 MG tablet Take 50 mg by mouth daily.   sucralfate (CARAFATE) 1 g tablet Take 1 tablet (1 g total) by mouth 4 (four) times daily -  with meals and at bedtime.   [DISCONTINUED] nystatin (MYCOSTATIN) 100000 UNIT/ML suspension Take 5 mLs (500,000 Units total) by mouth 4 (four) times daily  for 7 days.   [DISCONTINUED] omeprazole (PRILOSEC) 20 MG capsule Take 1 capsule (20 mg total) by mouth 2 (two) times daily before a meal.   Facility-Administered Encounter Medications as of 01/31/2024  Medication   cyanocobalamin (VITAMIN B12) injection 1,000 mcg    No Known Allergies  Pertinent ROS per HPI, otherwise unremarkable      Objective:  BP 129/85   Pulse 84   Temp (!) 97.4 F (36.3 C)   Ht 5\' 4"  (1.626 m)   Wt 126 lb 9.6 oz (57.4 kg)   LMP 07/22/2023 (Approximate)   SpO2 95%   BMI 21.73 kg/m    Wt Readings from Last 3 Encounters:  01/31/24 126 lb 9.6 oz (57.4 kg)  12/19/23 144 lb (65.3 kg)  11/02/23 135 lb 9.6 oz (61.5 kg)    Physical Exam Vitals and nursing note reviewed.  Constitutional:      General: She is not in acute distress.    Appearance: Normal appearance. She is normal weight. She is not ill-appearing, toxic-appearing or diaphoretic.  HENT:     Head: Normocephalic and atraumatic.     Nose: Nose normal.     Mouth/Throat:     Lips: Pink.     Mouth: Mucous membranes are moist.     Pharynx: Oropharynx is clear. Uvula midline.     Comments: White patches on tongue with some erythema. White patches to right buccal area Eyes:     Conjunctiva/sclera: Conjunctivae normal.     Pupils: Pupils are equal, round, and reactive to light.  Cardiovascular:     Rate and Rhythm:  Normal rate.  Pulmonary:     Effort: Pulmonary effort is normal.  Musculoskeletal:     Comments: Scoliosis  Skin:    General: Skin is warm and dry.     Capillary Refill: Capillary refill takes less than 2 seconds.  Neurological:     General: No focal deficit present.     Mental Status: She is alert and oriented to person, place, and time.      Results for orders placed or performed in visit on 11/02/23  Anemia Profile B   Collection Time: 11/02/23 10:36 AM  Result Value Ref Range   Total Iron Binding Capacity 356 250 - 450 ug/dL   UIBC 409 811 - 914 ug/dL   Iron 782 27 - 956 ug/dL   Iron Saturation 30 15 - 55 %   Ferritin 28 15 - 150 ng/mL   Vitamin B-12 424 232 - 1,245 pg/mL   Folate 11.5 >3.0 ng/mL   WBC 6.9 3.4 - 10.8 x10E3/uL   RBC 4.56 3.77 - 5.28 x10E6/uL   Hemoglobin 14.4 11.1 - 15.9 g/dL   Hematocrit 21.3 08.6 - 46.6 %   MCV 96 79 - 97 fL   MCH 31.6 26.6 - 33.0 pg   MCHC 32.9 31.5 - 35.7 g/dL   RDW 57.8 46.9 - 62.9 %   Platelets 267 150 - 450 x10E3/uL   Neutrophils 47 Not Estab. %   Lymphs 42 Not Estab. %   Monocytes 7 Not Estab. %   Eos 3 Not Estab. %   Basos 1 Not Estab. %   Neutrophils Absolute 3.3 1.4 - 7.0 x10E3/uL   Lymphocytes Absolute 2.9 0.7 - 3.1 x10E3/uL   Monocytes Absolute 0.5 0.1 - 0.9 x10E3/uL   EOS (ABSOLUTE) 0.2 0.0 - 0.4 x10E3/uL   Basophils Absolute 0.1 0.0 - 0.2 x10E3/uL   Immature Granulocytes 0 Not Estab. %  Immature Grans (Abs) 0.0 0.0 - 0.1 x10E3/uL   Retic Ct Pct 1.9 0.6 - 2.6 %  CMP14+EGFR   Collection Time: 11/02/23 10:36 AM  Result Value Ref Range   Glucose 83 70 - 99 mg/dL   BUN 16 6 - 20 mg/dL   Creatinine, Ser 0.98 0.57 - 1.00 mg/dL   eGFR 84 >11 BJ/YNW/2.95   BUN/Creatinine Ratio 18 9 - 23   Sodium 138 134 - 144 mmol/L   Potassium 4.2 3.5 - 5.2 mmol/L   Chloride 103 96 - 106 mmol/L   CO2 21 20 - 29 mmol/L   Calcium 9.3 8.7 - 10.2 mg/dL   Total Protein 7.2 6.0 - 8.5 g/dL   Albumin 4.4 3.9 - 4.9 g/dL   Globulin,  Total 2.8 1.5 - 4.5 g/dL   Bilirubin Total 0.5 0.0 - 1.2 mg/dL   Alkaline Phosphatase 66 44 - 121 IU/L   AST 21 0 - 40 IU/L   ALT 17 0 - 32 IU/L  Lipid panel   Collection Time: 11/02/23 10:36 AM  Result Value Ref Range   Cholesterol, Total 207 (H) 100 - 199 mg/dL   Triglycerides 52 0 - 149 mg/dL   HDL 49 >62 mg/dL   VLDL Cholesterol Cal 9 5 - 40 mg/dL   LDL Chol Calc (NIH) 130 (H) 0 - 99 mg/dL   Chol/HDL Ratio 4.2 0.0 - 4.4 ratio  Thyroid Panel With TSH   Collection Time: 11/02/23 10:36 AM  Result Value Ref Range   TSH 1.130 0.450 - 4.500 uIU/mL   T4, Total 7.5 4.5 - 12.0 ug/dL   T3 Uptake Ratio 26 24 - 39 %   Free Thyroxine Index 2.0 1.2 - 4.9  VITAMIN D 25 Hydroxy (Vit-D Deficiency, Fractures)   Collection Time: 11/02/23 10:36 AM  Result Value Ref Range   Vit D, 25-Hydroxy 36.7 30.0 - 100.0 ng/mL  Hepatitis C antibody   Collection Time: 11/02/23 10:36 AM  Result Value Ref Range   Hep C Virus Ab Non Reactive Non Reactive  HIV Antibody (routine testing w rflx)   Collection Time: 11/02/23 10:36 AM  Result Value Ref Range   HIV Screen 4th Generation wRfx Non Reactive Non Reactive       Pertinent labs & imaging results that were available during my care of the patient were reviewed by me and considered in my medical decision making.  Assessment & Plan:  Jaqlyn was seen today for oral pain.  Diagnoses and all orders for this visit:  Thrush -     Anemia Profile B -     CMP14+EGFR -     Bayer DCA Hb A1c Waived -     Thyroid Panel With TSH -     HepB+HepC+HIV Panel -     clotrimazole (MYCELEX) 10 MG troche; Take 1 tablet (10 mg total) by mouth 5 (five) times daily for 10 days.  Weight loss -     Anemia Profile B -     CMP14+EGFR -     Bayer DCA Hb A1c Waived -     Thyroid Panel With TSH -     HepB+HepC+HIV Panel     Assessment and Plan    Oral Candidiasis (Thrush) Persistent oral thrush with white patches causing pain and difficulty eating and drinking. Previous  nystatin treatment was ineffective. Considering alternative treatment with troches, which are expected to be more effective. Concern for underlying conditions such as immunocompromised states contributing to recurrent thrush. - Discontinue  nystatin - Prescribe troches to be used five times a day for ten days - Order laboratory tests to investigate underlying causes, including diabetes, HIV, thyroid dysfunction, and anemia - Consider ENT referral if thrush persists despite treatment  Unintentional Weight Loss Significant weight loss potentially related to difficulty eating and drinking due to oral thrush. Concern for underlying conditions such as immunocompromised states or anemia contributing to weight loss. Labs will be conducted to rule out these conditions. - Order laboratory tests to investigate underlying causes, including diabetes, HIV, thyroid dysfunction, and anemia          Continue all other maintenance medications.  Follow up plan: Return if symptoms worsen or fail to improve.   Continue healthy lifestyle choices, including diet (rich in fruits, vegetables, and lean proteins, and low in salt and simple carbohydrates) and exercise (at least 30 minutes of moderate physical activity daily).    The above assessment and management plan was discussed with the patient. The patient verbalized understanding of and has agreed to the management plan. Patient is aware to call the clinic if they develop any new symptoms or if symptoms persist or worsen. Patient is aware when to return to the clinic for a follow-up visit. Patient educated on when it is appropriate to go to the emergency department.   Kari Baars, FNP-C [facility] [phone]

## 2024-02-01 ENCOUNTER — Encounter: Payer: Self-pay | Admitting: Family Medicine

## 2024-02-05 ENCOUNTER — Ambulatory Visit: Payer: Managed Care, Other (non HMO) | Admitting: Gastroenterology

## 2024-02-05 LAB — ANEMIA PROFILE B
Basophils Absolute: 0.1 10*3/uL (ref 0.0–0.2)
Basos: 1 %
EOS (ABSOLUTE): 0.4 10*3/uL (ref 0.0–0.4)
Eos: 5 %
Ferritin: 47 ng/mL (ref 15–150)
Hematocrit: 43.6 % (ref 34.0–46.6)
Hemoglobin: 14.2 g/dL (ref 11.1–15.9)
Immature Grans (Abs): 0.1 10*3/uL (ref 0.0–0.1)
Immature Granulocytes: 1 %
Iron Saturation: 37 % (ref 15–55)
Iron: 111 ug/dL (ref 27–159)
Lymphocytes Absolute: 2.6 10*3/uL (ref 0.7–3.1)
Lymphs: 34 %
MCH: 32.1 pg (ref 26.6–33.0)
MCHC: 32.6 g/dL (ref 31.5–35.7)
MCV: 99 fL — ABNORMAL HIGH (ref 79–97)
Monocytes Absolute: 0.5 10*3/uL (ref 0.1–0.9)
Monocytes: 7 %
Neutrophils Absolute: 4 10*3/uL (ref 1.4–7.0)
Neutrophils: 52 %
Platelets: 285 10*3/uL (ref 150–450)
RBC: 4.42 x10E6/uL (ref 3.77–5.28)
RDW: 12.2 % (ref 11.7–15.4)
Retic Ct Pct: 1.8 % (ref 0.6–2.6)
Total Iron Binding Capacity: 304 ug/dL (ref 250–450)
UIBC: 193 ug/dL (ref 131–425)
WBC: 7.6 10*3/uL (ref 3.4–10.8)

## 2024-02-05 LAB — HEPB+HEPC+HIV PANEL
HIV Screen 4th Generation wRfx: NONREACTIVE
Hep B C IgM: NEGATIVE
Hep B Core Total Ab: NEGATIVE
Hep B E Ab: NONREACTIVE
Hep B E Ag: NEGATIVE
Hep B Surface Ab, Qual: REACTIVE
Hep C Virus Ab: NONREACTIVE
Hepatitis B Surface Ag: NEGATIVE

## 2024-02-05 LAB — THYROID PANEL WITH TSH
Free Thyroxine Index: 1.5 (ref 1.2–4.9)
T3 Uptake Ratio: 24 % (ref 24–39)
T4, Total: 6.1 ug/dL (ref 4.5–12.0)
TSH: 0.732 u[IU]/mL (ref 0.450–4.500)

## 2024-02-05 LAB — CMP14+EGFR
ALT: 11 IU/L (ref 0–32)
AST: 20 IU/L (ref 0–40)
Albumin: 4.2 g/dL (ref 3.9–4.9)
Alkaline Phosphatase: 85 IU/L (ref 44–121)
BUN/Creatinine Ratio: 13 (ref 9–23)
BUN: 11 mg/dL (ref 6–20)
Bilirubin Total: 0.4 mg/dL (ref 0.0–1.2)
CO2: 18 mmol/L — ABNORMAL LOW (ref 20–29)
Calcium: 9.4 mg/dL (ref 8.7–10.2)
Chloride: 100 mmol/L (ref 96–106)
Creatinine, Ser: 0.83 mg/dL (ref 0.57–1.00)
Globulin, Total: 2.7 g/dL (ref 1.5–4.5)
Glucose: 79 mg/dL (ref 70–99)
Potassium: 4.3 mmol/L (ref 3.5–5.2)
Sodium: 141 mmol/L (ref 134–144)
Total Protein: 6.9 g/dL (ref 6.0–8.5)
eGFR: 92 mL/min/{1.73_m2} (ref >59)

## 2024-02-18 ENCOUNTER — Ambulatory Visit: Payer: Managed Care, Other (non HMO)

## 2024-03-03 ENCOUNTER — Other Ambulatory Visit: Payer: Self-pay

## 2024-03-03 ENCOUNTER — Encounter (HOSPITAL_COMMUNITY): Payer: Self-pay | Admitting: Emergency Medicine

## 2024-03-03 ENCOUNTER — Emergency Department (HOSPITAL_COMMUNITY)
Admission: EM | Admit: 2024-03-03 | Discharge: 2024-03-04 | Disposition: A | Discharge status: Home / Self Care | Attending: Emergency Medicine | Admitting: Emergency Medicine

## 2024-03-03 DIAGNOSIS — E876 Hypokalemia: Secondary | ICD-10-CM | POA: Diagnosis not present

## 2024-03-03 DIAGNOSIS — F32A Depression, unspecified: Secondary | ICD-10-CM | POA: Insufficient documentation

## 2024-03-03 DIAGNOSIS — F330 Major depressive disorder, recurrent, mild: Secondary | ICD-10-CM

## 2024-03-03 DIAGNOSIS — F909 Attention-deficit hyperactivity disorder, unspecified type: Secondary | ICD-10-CM | POA: Diagnosis not present

## 2024-03-03 DIAGNOSIS — F902 Attention-deficit hyperactivity disorder, combined type: Secondary | ICD-10-CM | POA: Diagnosis not present

## 2024-03-03 DIAGNOSIS — F411 Generalized anxiety disorder: Secondary | ICD-10-CM

## 2024-03-03 DIAGNOSIS — F419 Anxiety disorder, unspecified: Secondary | ICD-10-CM | POA: Diagnosis present

## 2024-03-03 HISTORY — DX: Depression, unspecified: F32.A

## 2024-03-03 HISTORY — DX: Anxiety disorder, unspecified: F41.9

## 2024-03-03 LAB — CBC WITH DIFFERENTIAL/PLATELET
Abs Immature Granulocytes: 0.03 10*3/uL (ref 0.00–0.07)
Basophils Absolute: 0.1 10*3/uL (ref 0.0–0.1)
Basophils Relative: 1 %
Eosinophils Absolute: 0.6 10*3/uL — ABNORMAL HIGH (ref 0.0–0.5)
Eosinophils Relative: 7 %
HCT: 41.8 % (ref 36.0–46.0)
Hemoglobin: 14.3 g/dL (ref 12.0–15.0)
Immature Granulocytes: 0 %
Lymphocytes Relative: 36 %
Lymphs Abs: 3.3 10*3/uL (ref 0.7–4.0)
MCH: 32.1 pg (ref 26.0–34.0)
MCHC: 34.2 g/dL (ref 30.0–36.0)
MCV: 93.7 fL (ref 80.0–100.0)
Monocytes Absolute: 0.7 10*3/uL (ref 0.1–1.0)
Monocytes Relative: 8 %
Neutro Abs: 4.3 10*3/uL (ref 1.7–7.7)
Neutrophils Relative %: 48 %
Platelets: 275 10*3/uL (ref 150–400)
RBC: 4.46 MIL/uL (ref 3.87–5.11)
RDW: 12.8 % (ref 11.5–15.5)
WBC: 9 10*3/uL (ref 4.0–10.5)
nRBC: 0 % (ref 0.0–0.2)

## 2024-03-03 LAB — BASIC METABOLIC PANEL WITH GFR
Anion gap: 11 (ref 5–15)
BUN: 13 mg/dL (ref 6–20)
CO2: 26 mmol/L (ref 22–32)
Calcium: 9.3 mg/dL (ref 8.9–10.3)
Chloride: 98 mmol/L (ref 98–111)
Creatinine, Ser: 0.81 mg/dL (ref 0.44–1.00)
GFR, Estimated: >60 mL/min (ref >60)
Glucose, Bld: 91 mg/dL (ref 70–99)
Potassium: 3.2 mmol/L — ABNORMAL LOW (ref 3.5–5.1)
Sodium: 135 mmol/L (ref 135–145)

## 2024-03-03 LAB — SALICYLATE LEVEL: Salicylate Lvl: <7 mg/dL — ABNORMAL LOW (ref 7.0–30.0)

## 2024-03-03 LAB — URINALYSIS, ROUTINE W REFLEX MICROSCOPIC
Bilirubin Urine: NEGATIVE
Glucose, UA: NEGATIVE mg/dL
Ketones, ur: 5 mg/dL — AB
Leukocytes,Ua: NEGATIVE
Nitrite: NEGATIVE
Protein, ur: 30 mg/dL — AB
Specific Gravity, Urine: 1.013 (ref 1.005–1.030)
pH: 5 (ref 5.0–8.0)

## 2024-03-03 LAB — RAPID URINE DRUG SCREEN, HOSP PERFORMED
Amphetamines: POSITIVE — AB
Barbiturates: NOT DETECTED
Benzodiazepines: NOT DETECTED
Cocaine: NOT DETECTED
Opiates: NOT DETECTED
Tetrahydrocannabinol: POSITIVE — AB

## 2024-03-03 LAB — ETHANOL: Alcohol, Ethyl (B): <10 mg/dL (ref <10)

## 2024-03-03 LAB — ACETAMINOPHEN LEVEL: Acetaminophen (Tylenol), Serum: <10 ug/mL — ABNORMAL LOW (ref 10–30)

## 2024-03-03 MED ORDER — POTASSIUM CHLORIDE CRYS ER 20 MEQ PO TBCR
20.0000 meq | EXTENDED_RELEASE_TABLET | Freq: Once | ORAL | Status: AC
  Administered 2024-03-03: 20 meq via ORAL
  Filled 2024-03-03: qty 1
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
## 2024-03-03 NOTE — BH Assessment (Signed)
 Patient was deferred to [facility] for a telepsych assessment. The assigned care coordinator will provide updates regarding the scheduling of the assessment. [facility] care coordinator can be reached at [phone] for further information on the timing of the telepsych evaluation.

## 2024-03-03 NOTE — ED Triage Notes (Signed)
 Pt c/o anxiety and depression x1 week. Pt states she spoke with therapist and was told to come here for evaluation.
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
## 2024-03-03 NOTE — ED Notes (Signed)
 Pt vaping in hallway, security called to bedside. Vape taken and locked up in security office.
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
## 2024-03-04 NOTE — Discharge Instructions (Addendum)
 Continue home medications as previously prescribed.  Follow-up with your primary doctor/counselor, and return to the ER if symptoms worsen or change.

## 2024-03-06 ENCOUNTER — Ambulatory Visit: Admitting: Family Medicine

## 2024-03-06 ENCOUNTER — Other Ambulatory Visit: Payer: Self-pay

## 2024-03-06 MED ORDER — ESTRADIOL 0.1 MG/24HR TD PTTW: 1.0000 | MEDICATED_PATCH | TRANSDERMAL | 12 refills | Status: AC

## 2024-03-06 NOTE — Telephone Encounter (Signed)
 Patient would like for her estradiol (VIVELLE-DOT) 0.1 MG/24HR patch  to be sent to [facility] on [address] because it is cheaper.

## 2024-03-11 ENCOUNTER — Ambulatory Visit: Admitting: Family Medicine

## 2024-03-12 ENCOUNTER — Encounter: Payer: Self-pay | Admitting: Family Medicine

## 2024-04-24 ENCOUNTER — Other Ambulatory Visit: Payer: Self-pay | Admitting: Family Medicine

## 2024-04-24 ENCOUNTER — Other Ambulatory Visit: Payer: Self-pay | Admitting: Obstetrics & Gynecology

## 2024-04-24 DIAGNOSIS — R062 Wheezing: Secondary | ICD-10-CM

## 2024-04-24 DIAGNOSIS — B370 Candidal stomatitis: Secondary | ICD-10-CM

## 2024-04-24 DIAGNOSIS — B001 Herpesviral vesicular dermatitis: Secondary | ICD-10-CM

## 2024-04-24 DIAGNOSIS — B338 Other specified viral diseases: Secondary | ICD-10-CM

## 2024-04-30 ENCOUNTER — Telehealth: Payer: Self-pay | Admitting: Obstetrics & Gynecology

## 2024-04-30 NOTE — Telephone Encounter (Signed)
 Patient requesting refill on Zofran .  Message sent to Dr Randolm Butte.

## 2024-04-30 NOTE — Telephone Encounter (Signed)
 [facility] pharmacy in [place] called to get a refill on pt's medication and needs a call back from the office.

## 2024-06-21 ENCOUNTER — Other Ambulatory Visit: Payer: Self-pay | Admitting: Family Medicine

## 2024-06-21 DIAGNOSIS — K219 Gastro-esophageal reflux disease without esophagitis: Secondary | ICD-10-CM

## 2024-07-26 ENCOUNTER — Other Ambulatory Visit: Payer: Self-pay | Admitting: Family Medicine

## 2024-07-26 DIAGNOSIS — K219 Gastro-esophageal reflux disease without esophagitis: Secondary | ICD-10-CM

## 2024-07-26 DIAGNOSIS — J0100 Acute maxillary sinusitis, unspecified: Secondary | ICD-10-CM

## 2024-08-28 ENCOUNTER — Encounter: Payer: Self-pay | Admitting: Obstetrics & Gynecology

## 2024-08-28 ENCOUNTER — Ambulatory Visit: Admitting: Obstetrics & Gynecology

## 2024-08-28 VITALS — BP 125/86 | HR 111 | Ht 65.0 in | Wt 137.0 lb

## 2024-08-28 DIAGNOSIS — R232 Flushing: Secondary | ICD-10-CM | POA: Diagnosis not present

## 2024-08-28 NOTE — Progress Notes (Signed)
 Follow up appointment  Vasomotor symptoms  Chief Complaint  Patient presents with   Hot Flashes    Blood pressure 125/86, pulse (!) 111, height 5' 5 (1.651 m), weight 137 lb (62.1 kg), last menstrual period 07/22/2023.  Patient was having complaints of vasomotor symptoms after her surgery last year I got an [facility] which was 4, certainly normal But she kept complaining about it so I put her on a Vivelle -Dot patch 0.1 mg which seemed to cause her symptoms to subside She has had a normal thyroid  function test in March She states that her symptoms were pretty well managed until about the past month or so when she has begun to have hot flashes She is under number of psycho active drugs including Seroquel Lamictal and occasionally Lyrica Most likely 1 of these medications is affecting her thermoregulatory center I have never put the patient's name Veozah as well as estrogen replacement therapy so I have told her to switch around the time of day she takes her medicines and see if this makes any difference  MEDS ordered this encounter: No orders of the defined types were placed in this encounter.   Orders for this encounter: No orders of the defined types were placed in this encounter.   Impression + Management Plan   ICD-10-CM   1. Hot flashes: no evidence of ovarian failure and on vivelle  dot patch 0.1 mg  R23.2       Follow Up: Return if symptoms worsen or fail to improve.     All questions were answered.  Past Medical History:  Diagnosis Date   Anxiety    Bronchitis    Depression    Gastritis    GERD (gastroesophageal reflux disease)    Scoliosis     Past Surgical History:  Procedure Laterality Date   ROBOTIC ASSISTED TOTAL HYSTERECTOMY WITH BILATERAL SALPINGO OOPHERECTOMY Bilateral 09/05/2023   Procedure: XI ROBOTIC ASSISTED TOTAL HYSTERECTOMY WITH BILATERAL SALPINGECTOMY;  Surgeon: Jayne Vonn DEL, MD;  Location: [facility];  Service: Gynecology;  Laterality: Bilateral;    UPPER GI ENDOSCOPY      OB History     Gravida  2   Para  2   Term  2   Preterm      AB      Living  2      SAB      IAB      Ectopic      Multiple      Live Births              No Known Allergies  Social History   Socioeconomic History   Marital status: Married    Spouse name: Not on file   Number of children: Not on file   Years of education: Not on file   Highest education level: Associate degree: academic program  Occupational History   Not on file  Tobacco Use   Smoking status: Former    Current packs/day: 0.00    Average packs/day: 0.5 packs/day for 20.0 years (10.0 ttl pk-yrs)    Types: Cigarettes    Start date: 10/06/1995    Quit date: 10/06/2015    Years since quitting: 8.9   Smokeless tobacco: Never  Vaping Use   Vaping status: Every Day  Substance and Sexual Activity   Alcohol use: Yes    Comment: occassionally    Drug use: Never   Sexual activity: Yes    Birth control/protection: Surgical  Other Topics Concern  Not on file  Social History Narrative   Not on file   Social Drivers of Health   Financial Resource Strain: Low Risk  (05/19/2024)   Received from [facility]   Overall Financial Resource Strain (CARDIA)    Difficulty of Paying Living Expenses: Not hard at all  Food Insecurity: No Food Insecurity (05/19/2024)   Received from [facility]   Hunger Vital Sign    Within the past 12 months, you worried that your food would run out before you got the money to buy more.: Never true    Within the past 12 months, the food you bought just didn't last and you didn't have money to get more.: Never true  Transportation Needs: No Transportation Needs (05/19/2024)   Received from [facility] - Transportation    Lack of Transportation (Medical): No    Lack of Transportation (Non-Medical): No  Physical Activity: Insufficiently Active (12/06/2023)   Exercise Vital Sign    Days of Exercise per Week: 2 days    Minutes  of Exercise per Session: 10 min  Stress: No Stress Concern Present (12/06/2023)   [employer] of Occupational Health - Occupational Stress Questionnaire    Feeling of Stress : Not at all  Social Connections: Moderately Integrated (12/06/2023)   Social Connection and Isolation Panel    Frequency of Communication with Friends and Family: More than three times a week    Frequency of Social Gatherings with Friends and Family: Once a week    Attends Religious Services: More than 4 times per year    Active Member of [employer] or Organizations: No    Attends Engineer, structural: Not on file    Marital Status: Married    Family History  Problem Relation Age of Onset   Scoliosis Mother    Asthma Mother    Breast cancer Maternal Aunt    Breast cancer Maternal Grandmother

## 2024-09-11 DIAGNOSIS — D509 Iron deficiency anemia, unspecified: Secondary | ICD-10-CM | POA: Insufficient documentation

## 2024-09-17 ENCOUNTER — Inpatient Hospital Stay (HOSPITAL_COMMUNITY)
Admission: EM | Admit: 2024-09-17 | Discharge: 2024-09-23 | DRG: 372 | Disposition: A | Discharge status: Home / Self Care | Attending: Hospitalist | Admitting: Hospitalist

## 2024-09-17 ENCOUNTER — Other Ambulatory Visit: Payer: Self-pay

## 2024-09-17 ENCOUNTER — Emergency Department (HOSPITAL_COMMUNITY)

## 2024-09-17 ENCOUNTER — Encounter (HOSPITAL_COMMUNITY): Payer: Self-pay | Admitting: Emergency Medicine

## 2024-09-17 DIAGNOSIS — R197 Diarrhea, unspecified: Secondary | ICD-10-CM | POA: Diagnosis not present

## 2024-09-17 DIAGNOSIS — E46 Unspecified protein-calorie malnutrition: Secondary | ICD-10-CM | POA: Diagnosis not present

## 2024-09-17 DIAGNOSIS — K219 Gastro-esophageal reflux disease without esophagitis: Secondary | ICD-10-CM | POA: Diagnosis present

## 2024-09-17 DIAGNOSIS — E860 Dehydration: Secondary | ICD-10-CM | POA: Diagnosis present

## 2024-09-17 DIAGNOSIS — K529 Noninfective gastroenteritis and colitis, unspecified: Principal | ICD-10-CM | POA: Diagnosis present

## 2024-09-17 DIAGNOSIS — F17290 Nicotine dependence, other tobacco product, uncomplicated: Secondary | ICD-10-CM | POA: Diagnosis present

## 2024-09-17 DIAGNOSIS — Z803 Family history of malignant neoplasm of breast: Secondary | ICD-10-CM | POA: Diagnosis not present

## 2024-09-17 DIAGNOSIS — Z79818 Long term (current) use of other agents affecting estrogen receptors and estrogen levels: Secondary | ICD-10-CM

## 2024-09-17 DIAGNOSIS — Z825 Family history of asthma and other chronic lower respiratory diseases: Secondary | ICD-10-CM

## 2024-09-17 DIAGNOSIS — Z79899 Other long term (current) drug therapy: Secondary | ICD-10-CM

## 2024-09-17 DIAGNOSIS — F339 Major depressive disorder, recurrent, unspecified: Secondary | ICD-10-CM | POA: Diagnosis present

## 2024-09-17 DIAGNOSIS — E8809 Other disorders of plasma-protein metabolism, not elsewhere classified: Secondary | ICD-10-CM | POA: Diagnosis present

## 2024-09-17 DIAGNOSIS — A0472 Enterocolitis due to Clostridium difficile, not specified as recurrent: Principal | ICD-10-CM | POA: Diagnosis present

## 2024-09-17 DIAGNOSIS — R1084 Generalized abdominal pain: Secondary | ICD-10-CM | POA: Diagnosis not present

## 2024-09-17 DIAGNOSIS — Z791 Long term (current) use of non-steroidal anti-inflammatories (NSAID): Secondary | ICD-10-CM | POA: Diagnosis not present

## 2024-09-17 DIAGNOSIS — N3090 Cystitis, unspecified without hematuria: Secondary | ICD-10-CM

## 2024-09-17 DIAGNOSIS — E876 Hypokalemia: Secondary | ICD-10-CM | POA: Diagnosis present

## 2024-09-17 DIAGNOSIS — D3501 Benign neoplasm of right adrenal gland: Secondary | ICD-10-CM | POA: Diagnosis present

## 2024-09-17 DIAGNOSIS — R188 Other ascites: Secondary | ICD-10-CM | POA: Diagnosis present

## 2024-09-17 DIAGNOSIS — D1803 Hemangioma of intra-abdominal structures: Secondary | ICD-10-CM | POA: Diagnosis present

## 2024-09-17 DIAGNOSIS — Z8269 Family history of other diseases of the musculoskeletal system and connective tissue: Secondary | ICD-10-CM | POA: Diagnosis not present

## 2024-09-17 LAB — URINALYSIS, ROUTINE W REFLEX MICROSCOPIC
Bilirubin Urine: NEGATIVE
Glucose, UA: NEGATIVE mg/dL
Ketones, ur: 20 mg/dL — AB
Leukocytes,Ua: NEGATIVE
Nitrite: NEGATIVE
Protein, ur: 100 mg/dL — AB
Specific Gravity, Urine: 1.026 (ref 1.005–1.030)
pH: 6 (ref 5.0–8.0)

## 2024-09-17 LAB — LIPASE, BLOOD: Lipase: 31 U/L (ref 11–51)

## 2024-09-17 LAB — COMPREHENSIVE METABOLIC PANEL WITH GFR
ALT: 15 U/L (ref 0–44)
AST: 22 U/L (ref 15–41)
Albumin: 3.1 g/dL — ABNORMAL LOW (ref 3.5–5.0)
Alkaline Phosphatase: 92 U/L (ref 38–126)
Anion gap: 14 (ref 5–15)
BUN: 15 mg/dL (ref 6–20)
CO2: 26 mmol/L (ref 22–32)
Calcium: 8.3 mg/dL — ABNORMAL LOW (ref 8.9–10.3)
Chloride: 92 mmol/L — ABNORMAL LOW (ref 98–111)
Creatinine, Ser: 0.73 mg/dL (ref 0.44–1.00)
GFR, Estimated: >60 mL/min (ref >60)
Glucose, Bld: 100 mg/dL — ABNORMAL HIGH (ref 70–99)
Potassium: 2.8 mmol/L — ABNORMAL LOW (ref 3.5–5.1)
Sodium: 132 mmol/L — ABNORMAL LOW (ref 135–145)
Total Bilirubin: 0.3 mg/dL (ref 0.0–1.2)
Total Protein: 6.5 g/dL (ref 6.5–8.1)

## 2024-09-17 LAB — CBC
HCT: 43.1 % (ref 36.0–46.0)
Hemoglobin: 15.3 g/dL — ABNORMAL HIGH (ref 12.0–15.0)
MCH: 31 pg (ref 26.0–34.0)
MCHC: 35.5 g/dL (ref 30.0–36.0)
MCV: 87.4 fL (ref 80.0–100.0)
Platelets: 356 K/uL (ref 150–400)
RBC: 4.93 MIL/uL (ref 3.87–5.11)
RDW: 12.7 % (ref 11.5–15.5)
WBC: 11.9 K/uL — ABNORMAL HIGH (ref 4.0–10.5)
nRBC: 0 % (ref 0.0–0.2)

## 2024-09-17 MED ORDER — POTASSIUM CHLORIDE CRYS ER 20 MEQ PO TBCR
40.0000 meq | EXTENDED_RELEASE_TABLET | Freq: Once | ORAL | Status: AC
  Administered 2024-09-17: 40 meq via ORAL
  Filled 2024-09-17: qty 2

## 2024-09-17 MED ORDER — ONDANSETRON HCL 4 MG/2ML IJ SOLN
4.0000 mg | Freq: Once | INTRAMUSCULAR | Status: AC
  Administered 2024-09-17: 4 mg via INTRAVENOUS
  Filled 2024-09-17: qty 2

## 2024-09-17 MED ORDER — HYDROMORPHONE HCL 1 MG/ML IJ SOLN
0.5000 mg | Freq: Once | INTRAMUSCULAR | Status: AC
  Administered 2024-09-17: 0.5 mg via INTRAVENOUS
  Filled 2024-09-17: qty 0.5

## 2024-09-17 MED ORDER — IOHEXOL 300 MG/ML  SOLN
100.0000 mL | Freq: Once | INTRAMUSCULAR | Status: AC | PRN
  Administered 2024-09-17: 100 mL via INTRAVENOUS

## 2024-09-17 MED ORDER — METRONIDAZOLE 500 MG/100ML IV SOLN
500.0000 mg | Freq: Once | INTRAVENOUS | Status: AC
  Administered 2024-09-18: 500 mg via INTRAVENOUS
  Filled 2024-09-17: qty 100

## 2024-09-17 MED ORDER — SODIUM CHLORIDE 0.9 % IV BOLUS
1500.0000 mL | Freq: Once | INTRAVENOUS | Status: AC
  Administered 2024-09-17: 1500 mL via INTRAVENOUS

## 2024-09-17 MED ORDER — KETOROLAC TROMETHAMINE 30 MG/ML IJ SOLN
15.0000 mg | Freq: Once | INTRAMUSCULAR | Status: AC
  Administered 2024-09-17: 15 mg via INTRAVENOUS
  Filled 2024-09-17: qty 1

## 2024-09-17 MED ORDER — SODIUM CHLORIDE 0.9 % IV SOLN
2.0000 g | Freq: Once | INTRAVENOUS | Status: AC
  Administered 2024-09-17: 2 g via INTRAVENOUS
  Filled 2024-09-17: qty 20

## 2024-09-17 NOTE — ED Triage Notes (Signed)
 Pt reports diarrhea since Sunday night. Also c/o abd pain, weakness. Pt reports nausea but no vomiting.

## 2024-09-17 NOTE — ED Notes (Signed)
 ED Provider at bedside.

## 2024-09-17 NOTE — H&P (Signed)
 History and Physical    Patient: Lindsay Mccoy FMW:[id number] DOB: May 09, 1986 DOA: 09/17/2024 DOS: the patient was seen and examined on 09/17/2024 PCP: Pcp, No  Patient coming from: {Point_of_Origin:[address]}  Chief Complaint:  Chief Complaint  Patient presents with   Diarrhea   Abdominal Pain   HPI: Lindsay Mccoy is a 38 y.o. female with medical history significant of ***  Review of Systems: {ROS_Text:26778} Past Medical History:  Diagnosis Date   Anxiety    Bronchitis    Depression    Gastritis    GERD (gastroesophageal reflux disease)    Scoliosis    Past Surgical History:  Procedure Laterality Date   ROBOTIC ASSISTED TOTAL HYSTERECTOMY WITH BILATERAL SALPINGO OOPHERECTOMY Bilateral 09/05/2023   Procedure: XI ROBOTIC ASSISTED TOTAL HYSTERECTOMY WITH BILATERAL SALPINGECTOMY;  Surgeon: Jayne Vonn DEL, MD;  Location: [facility];  Service: Gynecology;  Laterality: Bilateral;   UPPER GI ENDOSCOPY     Social History:  reports that she quit smoking about 8 years ago. Her smoking use included cigarettes. She started smoking about 28 years ago. She has a 10 pack-year smoking history. She has never used smokeless tobacco. She reports current alcohol use. She reports that she does not use drugs.  No Known Allergies  Family History  Problem Relation Age of Onset   Scoliosis Mother    Asthma Mother    Breast cancer Maternal Aunt    Breast cancer Maternal Grandmother     Prior to Admission medications   Medication Sig Start Date End Date Taking? Authorizing Provider  acetaminophen  (TYLENOL ) 500 MG tablet Take 1,000 mg by mouth every 6 (six) hours as needed for moderate pain.    [provider]  albuterol  (VENTOLIN  HFA) 108 (90 Base) MCG/ACT inhaler Inhale 2 puffs into the lungs every 6 (six) hours as needed for wheezing or shortness of breath. 12/19/23   Rakes, Rock HERO, FNP  amoxicillin  (AMOXIL ) 500 MG capsule Take 500 mg by mouth 4 (four) times daily. 08/25/24   [provider]   celecoxib (CELEBREX) 100 MG capsule Take 100 mg by mouth 2 (two) times daily. 07/11/23   [provider]  cyclobenzaprine  (FLEXERIL ) 10 MG tablet Take 10 mg by mouth 3 (three) times daily. 08/26/24   [provider]  cyclobenzaprine  (FLEXERIL ) 10 MG tablet Take 10 mg by mouth at bedtime. 05/19/24   [provider]  doxycycline  (VIBRA -TABS) 100 MG tablet Take 100 mg by mouth daily. 05/07/24   [provider]  estradiol  (VIVELLE -DOT) 0.1 MG/24HR patch Place 1 patch (0.1 mg total) onto the skin 2 (two) times a week. 03/06/24   Jayne Vonn DEL, MD  fexofenadine (ALLEGRA) 180 MG tablet Take 180 mg by mouth daily. Patient not taking: Reported on 08/28/2024    [provider]  fluticasone  (FLONASE ) 50 MCG/ACT nasal spray Place 2 sprays into both nostrils daily. 12/06/23   Severa Rock HERO, FNP  gabapentin (NEURONTIN) 300 MG capsule Take 300 mg by mouth 3 (three) times daily. 01/26/24   [provider]  hydrOXYzine (ATARAX) 50 MG tablet Take 50 mg by mouth 3 (three) times daily.    [provider]  lamoTRIgine (LAMICTAL) 25 MG tablet Take 25 mg by mouth daily. 03/25/24   [provider]  lisdexamfetamine (VYVANSE) 30 MG capsule Take 30 mg by mouth daily.    [provider]  loratadine (CLARITIN) 10 MG tablet Take 10 mg by mouth at bedtime.    [provider]  metroNIDAZOLE  (METROGEL ) 0.75 % vaginal  gel Place 1 Applicatorful vaginally at bedtime. 10/23/23   Jayne Vonn DEL, MD  misoprostol (CYTOTEC) 100 MCG tablet Take 100 mcg by mouth 2 (two) times daily as needed. Patient not taking: Reported on 08/28/2024 10/02/23   [provider]  ondansetron  (ZOFRAN -ODT) 8 MG disintegrating tablet DISSOLVE 1 TABLET(8 MG) ON THE TONGUE EVERY 8 HOURS AS NEEDED FOR NAUSEA OR VOMITING Patient not taking: Reported on 08/28/2024 05/03/24   Jayne Vonn DEL, MD  pantoprazole  (PROTONIX ) 40 MG tablet TAKE 1 TABLET BY MOUTH DAILY 12/31/23   Severa Rock HERO, FNP  potassium chloride  SA (KLOR-CON  M) 20 MEQ tablet Take 1 tablet (20 mEq total) by mouth daily. Patient not taking: Reported on 08/28/2024 09/05/23   Jayne Vonn DEL, MD  pregabalin (LYRICA) 50 MG capsule Take 50 mg by mouth daily. 08/25/24   [provider]  QUEtiapine (SEROQUEL) 100 MG tablet Take 100 mg by mouth daily. 05/12/24   [provider]  sertraline (ZOLOFT) 100 MG tablet Take 100 mg by mouth daily. 07/26/24   [provider]  sertraline (ZOLOFT) 50 MG tablet Take 50 mg by mouth daily.    [provider]  sucralfate  (CARAFATE ) 1 g tablet Take 1 tablet (1 g total) by mouth 4 (four) times daily -  with meals and at bedtime. Patient not taking: Reported on 08/28/2024 08/16/23   Jayne Vonn DEL, MD  traMADol (ULTRAM) 50 MG tablet Take 50 mg by mouth 2 (two) times daily as needed. 07/02/24   [provider]  traZODone (DESYREL) 50 MG tablet Take 50 mg by mouth at bedtime.    [provider]  omeprazole  (PRILOSEC) 20 MG capsule Take 1 capsule (20 mg total) by mouth 2 (two) times daily before a meal. 12/15/20 02/02/21  Landy Honora CROME, PA-C    Physical Exam: Vitals:   09/17/24 2230 09/17/24 2245 09/17/24 2259 09/17/24 2330  BP: 111/66 115/74 (!) 121/90 107/64  Pulse: (!) 106 99 97 98  Resp:      Temp:      TempSrc:      SpO2: 98% 99% 99% 99%   *** Data Reviewed: {Tip this will not be part of the note when signed- Document your independent interpretation of telemetry tracing, EKG, lab, Radiology test or any other diagnostic tests. Add any new diagnostic test ordered today. (Optional):26781} {Results:26384}  Assessment and Plan: No notes have been filed under this hospital service. Service: Hospitalist     Advance Care Planning:   Code Status: Not on file ***  Consults: ***  Family Communication: ***  Severity of Illness: {Observation/Inpatient:21159}  Author: Posey Maier, DO 09/17/2024 11:58 PM  For on call  review [email or web address].

## 2024-09-17 NOTE — ED Provider Notes (Signed)
 [place] EMERGENCY DEPARTMENT AT [facility] Provider Note  CSN: [id number] Arrival date & time: 09/17/24 1933  Chief Complaint(s) Diarrhea and Abdominal Pain  HPI Lindsay Mccoy is a 38 y.o. female history of GERD presenting to the emergency department with abdominal pain.  Patient reports abdominal pain for the past 3 to 4 days.  Reports associated diarrhea.  Reports innumerable episodes of diarrhea.  Reports having some accidents.  She reports nausea, no vomiting.  Reports some chills.  No fever.  Does report recent antibiotic use, was on amoxicillin  for dental extraction.  Denies any travel.  Denies similar episode.  Denies sick contact.  Denies painful urination.   Past Medical History Past Medical History:  Diagnosis Date   Anxiety    Bronchitis    Depression    Gastritis    GERD (gastroesophageal reflux disease)    Scoliosis    Patient Active Problem List   Diagnosis Date Noted   Colitis 09/17/2024   Iron  deficiency anemia 09/11/2024   Menorrhagia with regular cycle 09/05/2023   Fibroids 09/05/2023   Dysmenorrhea 09/05/2023   Acute pelvic pain, female 09/05/2023   Adult ADHD 05/02/2023   Depression, recurrent 05/02/2023   GERD without esophagitis 03/03/2022   Chronic high back pain 02/24/2021   Mild intermittent asthma without complication 02/24/2021   Scoliosis deformity of spine 02/24/2021   Home Medication(s) Prior to Admission medications   Medication Sig Start Date End Date Taking? Authorizing Provider  acetaminophen  (TYLENOL ) 500 MG tablet Take 1,000 mg by mouth every 6 (six) hours as needed for moderate pain.    [provider]  albuterol  (VENTOLIN  HFA) 108 (90 Base) MCG/ACT inhaler Inhale 2 puffs into the lungs every 6 (six) hours as needed for wheezing or shortness of breath. 12/19/23   Rakes, Rock HERO, FNP  amoxicillin  (AMOXIL ) 500 MG capsule Take 500 mg by mouth 4 (four) times daily. 08/25/24   [provider]  celecoxib (CELEBREX)  100 MG capsule Take 100 mg by mouth 2 (two) times daily. 07/11/23   [provider]  cyclobenzaprine  (FLEXERIL ) 10 MG tablet Take 10 mg by mouth 3 (three) times daily. 08/26/24   [provider]  cyclobenzaprine  (FLEXERIL ) 10 MG tablet Take 10 mg by mouth at bedtime. 05/19/24   [provider]  doxycycline  (VIBRA -TABS) 100 MG tablet Take 100 mg by mouth daily. 05/07/24   [provider]  estradiol  (VIVELLE -DOT) 0.1 MG/24HR patch Place 1 patch (0.1 mg total) onto the skin 2 (two) times a week. 03/06/24   Jayne Vonn DEL, MD  fexofenadine (ALLEGRA) 180 MG tablet Take 180 mg by mouth daily. Patient not taking: Reported on 08/28/2024    [provider]  fluticasone  (FLONASE ) 50 MCG/ACT nasal spray Place 2 sprays into both nostrils daily. 12/06/23   Severa Rock HERO, FNP  gabapentin (NEURONTIN) 300 MG capsule Take 300 mg by mouth 3 (three) times daily. 01/26/24   [provider]  hydrOXYzine (ATARAX) 50 MG tablet Take 50 mg by mouth 3 (three) times daily.    [provider]  lamoTRIgine (LAMICTAL) 25 MG tablet Take 25 mg by mouth daily. 03/25/24   [provider]  lisdexamfetamine (VYVANSE) 30 MG capsule Take 30 mg by mouth daily.    [provider]  loratadine (CLARITIN) 10 MG tablet Take 10 mg by mouth at bedtime.    [provider]  metroNIDAZOLE  (METROGEL ) 0.75 % vaginal gel Place 1 Applicatorful vaginally at bedtime. 10/23/23   Jayne Vonn  H, MD  misoprostol (CYTOTEC) 100 MCG tablet Take 100 mcg by mouth 2 (two) times daily as needed. Patient not taking: Reported on 08/28/2024 10/02/23   [provider]  ondansetron  (ZOFRAN -ODT) 8 MG disintegrating tablet DISSOLVE 1 TABLET(8 MG) ON THE TONGUE EVERY 8 HOURS AS NEEDED FOR NAUSEA OR VOMITING Patient not taking: Reported on 08/28/2024 05/03/24   Jayne Vonn DEL, MD  pantoprazole  (PROTONIX ) 40 MG tablet TAKE 1 TABLET BY MOUTH DAILY 12/31/23   Severa Rock HERO, FNP  potassium  chloride SA (KLOR-CON  M) 20 MEQ tablet Take 1 tablet (20 mEq total) by mouth daily. Patient not taking: Reported on 08/28/2024 09/05/23   Jayne Vonn DEL, MD  pregabalin (LYRICA) 50 MG capsule Take 50 mg by mouth daily. 08/25/24   [provider]  QUEtiapine (SEROQUEL) 100 MG tablet Take 100 mg by mouth daily. 05/12/24   [provider]  sertraline (ZOLOFT) 100 MG tablet Take 100 mg by mouth daily. 07/26/24   [provider]  sertraline (ZOLOFT) 50 MG tablet Take 50 mg by mouth daily.    [provider]  sucralfate  (CARAFATE ) 1 g tablet Take 1 tablet (1 g total) by mouth 4 (four) times daily -  with meals and at bedtime. Patient not taking: Reported on 08/28/2024 08/16/23   Jayne Vonn DEL, MD  traMADol (ULTRAM) 50 MG tablet Take 50 mg by mouth 2 (two) times daily as needed. 07/02/24   [provider]  traZODone (DESYREL) 50 MG tablet Take 50 mg by mouth at bedtime.    [provider]  omeprazole  (PRILOSEC) 20 MG capsule Take 1 capsule (20 mg total) by mouth 2 (two) times daily before a meal. 12/15/20 02/02/21  Landy Honora CROME, PA-C                                                                                                                                    Past Surgical History Past Surgical History:  Procedure Laterality Date   ROBOTIC ASSISTED TOTAL HYSTERECTOMY WITH BILATERAL SALPINGO OOPHERECTOMY Bilateral 09/05/2023   Procedure: XI ROBOTIC ASSISTED TOTAL HYSTERECTOMY WITH BILATERAL SALPINGECTOMY;  Surgeon: Jayne Vonn DEL, MD;  Location: [facility];  Service: Gynecology;  Laterality: Bilateral;   UPPER GI ENDOSCOPY     Family History Family History  Problem Relation Age of Onset   Scoliosis Mother    Asthma Mother    Breast cancer Maternal Aunt    Breast cancer Maternal Grandmother     Social History Social History   Tobacco Use   Smoking status: Former    Current packs/day: 0.00    Average packs/day: 0.5 packs/day for 20.0 years (10.0  ttl pk-yrs)    Types: Cigarettes    Start date: 10/06/1995    Quit date: 10/06/2015    Years since quitting: 8.9   Smokeless tobacco: Never  Vaping Use   Vaping status: Every Day  Substance Use Topics  Alcohol use: Yes    Comment: occassionally    Drug use: Never   Allergies Patient has no known allergies.  Review of Systems Review of Systems  All other systems reviewed and are negative.   Physical Exam Vital Signs  I have reviewed the triage vital signs BP 115/74   Pulse 99   Temp 98 F (36.7 C) (Oral)   Resp 18   LMP 07/22/2023 (Approximate)   SpO2 99%  Physical Exam Vitals and nursing note reviewed.  Constitutional:      General: She is not in acute distress.    Appearance: She is well-developed.  HENT:     Head: Normocephalic and atraumatic.     Mouth/Throat:     Mouth: Mucous membranes are moist.  Eyes:     Pupils: Pupils are equal, round, and reactive to light.  Cardiovascular:     Rate and Rhythm: Regular rhythm. Tachycardia present.     Heart sounds: No murmur heard. Pulmonary:     Effort: Pulmonary effort is normal. No respiratory distress.     Breath sounds: Normal breath sounds.  Abdominal:     General: Abdomen is flat.     Palpations: Abdomen is soft.     Tenderness: There is generalized abdominal tenderness. There is guarding.  Musculoskeletal:        General: No tenderness.     Right lower leg: No edema.     Left lower leg: No edema.  Skin:    General: Skin is warm and dry.  Neurological:     General: No focal deficit present.     Mental Status: She is alert. Mental status is at baseline.  Psychiatric:        Mood and Affect: Mood normal.        Behavior: Behavior normal.     ED Results and Treatments Labs (all labs ordered are listed, but only abnormal results are displayed) Labs Reviewed  COMPREHENSIVE METABOLIC PANEL WITH GFR - Abnormal; Notable for the following components:      Result Value   Sodium 132 (*)    Potassium 2.8  (*)    Chloride 92 (*)    Glucose, Bld 100 (*)    Calcium 8.3 (*)    Albumin 3.1 (*)    All other components within normal limits  CBC - Abnormal; Notable for the following components:   WBC 11.9 (*)    Hemoglobin 15.3 (*)    All other components within normal limits  URINALYSIS, ROUTINE W REFLEX MICROSCOPIC - Abnormal; Notable for the following components:   APPearance HAZY (*)    Hgb urine dipstick MODERATE (*)    Ketones, ur 20 (*)    Protein, ur 100 (*)    Bacteria, UA RARE (*)    All other components within normal limits  C DIFFICILE QUICK SCREEN W PCR REFLEX    GASTROINTESTINAL PANEL BY PCR, STOOL (REPLACES STOOL CULTURE)  CULTURE, BLOOD (ROUTINE X 2)  CULTURE, BLOOD (ROUTINE X 2)  LIPASE, BLOOD  Radiology CT ABDOMEN PELVIS W CONTRAST Result Date: 09/17/2024 EXAM: CT ABDOMEN AND PELVIS WITH CONTRAST 09/17/2024 10:01:26 PM TECHNIQUE: CT of the abdomen and pelvis was performed with the administration of 100 mL of iohexol  (OMNIPAQUE ) 300 MG/ML solution. Multiplanar reformatted images are provided for review. Automated exposure control, iterative reconstruction, and/or weight-based adjustment of the mA/kV was utilized to reduce the radiation dose to as low as reasonably achievable. COMPARISON: Prior examinations of 02/02/2021 and 08/21/2019. CLINICAL HISTORY: Abdominal pain, acute, nonlocalized. Pt reports diarrhea since Sunday night. Also c/o abd pain, weakness. Pt reports nausea but no vomiting. FINDINGS: LOWER CHEST: No acute abnormality. LIVER: A stable 5.2 x 7.0 cm solid mass is seen within the lateral segment of the left hepatic lobe, better characterized on prior examinations of 02/02/2021 and 08/21/2019 where this was seen to demonstrate peripheral nodular enhancement and is compatible with a benign cavernous hemangioma. A smaller adjacent similar lesion is also  stable measuring 1 cm within segment 4. Liver otherwise unremarkable. GALLBLADDER AND BILE DUCTS: Gallbladder is unremarkable. No biliary ductal dilatation. SPLEEN: No acute abnormality. PANCREAS: No acute abnormality. 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 No follow up imaging is recommended for this lesion. Left adrenal gland is unremarkable. KIDNEYS, URETERS AND BLADDER: No stones in the kidneys or ureters. No hydronephrosis. No perinephric or periureteral stranding. There is circumferential bladder wall thickening and perivesicular inflammatory stranding in keeping with an infectious or inflammatory cystitis. The bladder is not distended. GI AND BOWEL: Stomach and small bowel are unremarkable. There is severe circumferential bowel wall thickening, mucosal hyperemia, periluminal inflammatory stranding and widespread mesenteric edema involving the entire colon and rectum in keeping with an infectious or inflammatory proctocolitis such as ulcerative colitis or pseudomembranous colitis. No evidence of obstruction. Appendix normal. PERITONEUM AND RETROPERITONEUM: Small free intraperitoneal fluid is seen within the pelvis. No free intraperitoneal gas. VASCULATURE: Aorta is normal in caliber. LYMPH NODES: No lymphadenopathy. REPRODUCTIVE ORGANS: Status post hysterectomy. No adnexal masses are seen. BONES AND SOFT TISSUES: No acute osseous abnormality. No focal soft tissue abnormality. IMPRESSION: 1. Severe circumferential colonic and rectal wall thickening with mucosal hyperemia, pericolonic inflammatory stranding, and diffuse mesenteric edema, consistent with infectious or inflammatory proctocolitis (e.g., ulcerative colitis or pseudomembranous colitis). Small pelvic  ascites. No obstruction or free intraperitoneal gas to suggest perforation . 2. Circumferential bladder wall thickening with perivesical inflammatory stranding, consistent with infectious or inflammatory cystitis. Bladder underdistention may contribute. correlation with urinalysis and urine culture may be helpful for further management. 3. Stable left hepatic hemangiomas, including a dominant 5.2 x 7.0 cm lesion with characteristic peripheral nodular enhancement; no follow-up recommended. 4. Stable 2.1 cm right adrenal lesion measuring 20 HU on noncontrast CT, most consistent with an adenoma given its long-term stability. If desired, this could be confirmed with adrenal washout CT or chemical-shift MRI for characterization, or 1-year follow-up adrenal washout CT. Electronically signed by: Dorethia Molt MD 09/17/2024 10:14 PM EDT RP Workstation: [id number]    Pertinent labs & imaging results that were available during my care of the patient were reviewed by me and considered in my medical decision making (see MDM for details).  Medications Ordered in ED Medications  cefTRIAXone  (ROCEPHIN ) 2 g in sodium chloride  0.9 % 899 mL IVPB (has no administration in time  range)  metroNIDAZOLE  (FLAGYL ) IVPB 500 mg (has no administration in time range)  sodium chloride  0.9 % bolus 1,500 mL (1,500 mLs Intravenous New Bag/Given 09/17/24 2204)  ondansetron  (ZOFRAN ) injection 4 mg (4 mg Intravenous Given 09/17/24 2206)  ketorolac  (TORADOL ) 30 MG/ML injection 15 mg (15 mg Intravenous Given 09/17/24 2206)  iohexol  (OMNIPAQUE ) 300 MG/ML solution 100 mL (100 mLs Intravenous Contrast Given 09/17/24 2156)  potassium chloride  SA (KLOR-CON  M) CR tablet 40 mEq (40 mEq Oral Given 09/17/24 2300)  HYDROmorphone  (DILAUDID ) injection 0.5 mg (0.5 mg Intravenous Given 09/17/24 2259)                                                                                                                                      Procedures Procedures  (including critical care time)  Medical Decision Making / ED Course   MDM:  38 year old presenting to the emergency department abdominal pain.  Patient overall well-appearing, vital signs notable for tachycardia.  Does have diffuse tenderness on exam.  Suspect symptoms due to colitis.  CT scan shows marked severity colitis with inflammatory stranding, mesenteric edema.  Differential includes infectious or inflammatory.  Extremely low concern for ischemic cause.  Will attempt to obtain stool studies.  Was on recent antibiotics so at high risk for C. difficile.  Given patient's abdominal exam, tachycardia, degree of severity of imaging findings, suspect that she would be best served by admission for observation and reassessment.  GI consult if needed.  No evidence of any acute surgical process.  Tachycardia has improved after IV fluids.  Will treat with IV antibiotics.  Signed out to hospitalist for admission.      Additional history obtained: -Additional history obtained from family -External records from outside source obtained and reviewed including: Chart review including previous notes, labs, imaging, consultation notes including prior notes    Lab Tests: -I ordered, reviewed, and interpreted labs.   The pertinent results include:   Labs Reviewed  COMPREHENSIVE METABOLIC PANEL WITH GFR - Abnormal; Notable for the following components:      Result Value   Sodium 132 (*)    Potassium 2.8 (*)    Chloride 92 (*)    Glucose, Bld 100 (*)    Calcium 8.3 (*)    Albumin 3.1 (*)    All other components within normal limits  CBC - Abnormal; Notable for the following components:   WBC 11.9 (*)    Hemoglobin 15.3 (*)    All other components within normal limits  URINALYSIS, ROUTINE W REFLEX MICROSCOPIC - Abnormal; Notable for the following components:   APPearance HAZY (*)    Hgb urine dipstick MODERATE (*)    Ketones, ur 20 (*)    Protein, ur 100 (*)     Bacteria, UA RARE (*)    All other components within normal limits  C DIFFICILE QUICK SCREEN W PCR REFLEX    GASTROINTESTINAL PANEL BY  PCR, STOOL (REPLACES STOOL CULTURE)  CULTURE, BLOOD (ROUTINE X 2)  CULTURE, BLOOD (ROUTINE X 2)  LIPASE, BLOOD    Notable for mild hyperkalemia, leukocytosis      Imaging Studies ordered: I ordered imaging studies including CT abdomen On my interpretation imaging demonstrates colitis  I independently visualized and interpreted imaging. I agree with the radiologist interpretation   Medicines ordered and prescription drug management: Meds ordered this encounter  Medications   sodium chloride  0.9 % bolus 1,500 mL   ondansetron  (ZOFRAN ) injection 4 mg   ketorolac  (TORADOL ) 30 MG/ML injection 15 mg   iohexol  (OMNIPAQUE ) 300 MG/ML solution 100 mL   cefTRIAXone  (ROCEPHIN ) 2 g in sodium chloride  0.9 % 100 mL IVPB    Antibiotic Indication::   Intra-abdominal   metroNIDAZOLE  (FLAGYL ) IVPB 500 mg   potassium chloride  SA (KLOR-CON  M) CR tablet 40 mEq   HYDROmorphone  (DILAUDID ) injection 0.5 mg    -I have reviewed the patients home medicines and have made adjustments as needed   Consultations Obtained: I requested consultation with the hospitalist,  and discussed lab and imaging findings as well as pertinent plan - they recommend: admission   Cardiac Monitoring: The patient was maintained on a cardiac monitor.  I personally viewed and interpreted the cardiac monitored which showed an underlying rhythm of: sinus tachycardia   Reevaluation: After the interventions noted above, I reevaluated the patient and found that their symptoms have improved  Co morbidities that complicate the patient evaluation  Past Medical History:  Diagnosis Date   Anxiety    Bronchitis    Depression    Gastritis    GERD (gastroesophageal reflux disease)    Scoliosis       Dispostion: Disposition decision including need for hospitalization was considered, and  patient admitted to the hospital.    Final Clinical Impression(s) / ED Diagnoses Final diagnoses:  Colitis     This chart was dictated using voice recognition software.  Despite best efforts to proofread,  errors can occur which can change the documentation meaning.    Francesca Elsie CROME, MD 09/17/24 [id number]

## 2024-09-17 NOTE — ED Notes (Signed)
 Patient transported to CT

## 2024-09-18 ENCOUNTER — Encounter (HOSPITAL_COMMUNITY): Payer: Self-pay | Admitting: Internal Medicine

## 2024-09-18 ENCOUNTER — Telehealth (HOSPITAL_COMMUNITY): Payer: Self-pay | Admitting: Pharmacy Technician

## 2024-09-18 ENCOUNTER — Other Ambulatory Visit (HOSPITAL_COMMUNITY): Payer: Self-pay

## 2024-09-18 DIAGNOSIS — K529 Noninfective gastroenteritis and colitis, unspecified: Secondary | ICD-10-CM

## 2024-09-18 DIAGNOSIS — A0472 Enterocolitis due to Clostridium difficile, not specified as recurrent: Secondary | ICD-10-CM | POA: Diagnosis present

## 2024-09-18 LAB — CBC
HCT: 36.7 % (ref 36.0–46.0)
Hemoglobin: 12.8 g/dL (ref 12.0–15.0)
MCH: 31.1 pg (ref 26.0–34.0)
MCHC: 34.9 g/dL (ref 30.0–36.0)
MCV: 89.3 fL (ref 80.0–100.0)
Platelets: 287 K/uL (ref 150–400)
RBC: 4.11 MIL/uL (ref 3.87–5.11)
RDW: 12.7 % (ref 11.5–15.5)
WBC: 9.3 K/uL (ref 4.0–10.5)
nRBC: 0 % (ref 0.0–0.2)

## 2024-09-18 LAB — C DIFFICILE QUICK SCREEN W PCR REFLEX
C Diff antigen: POSITIVE — AB
C Diff toxin: NEGATIVE

## 2024-09-18 LAB — COMPREHENSIVE METABOLIC PANEL WITH GFR
ALT: 11 U/L (ref 0–44)
AST: 17 U/L (ref 15–41)
Albumin: 2.5 g/dL — ABNORMAL LOW (ref 3.5–5.0)
Alkaline Phosphatase: 85 U/L (ref 38–126)
Anion gap: 8 (ref 5–15)
BUN: 11 mg/dL (ref 6–20)
CO2: 27 mmol/L (ref 22–32)
Calcium: 7.3 mg/dL — ABNORMAL LOW (ref 8.9–10.3)
Chloride: 97 mmol/L — ABNORMAL LOW (ref 98–111)
Creatinine, Ser: 0.59 mg/dL (ref 0.44–1.00)
GFR, Estimated: >60 mL/min (ref >60)
Glucose, Bld: 101 mg/dL — ABNORMAL HIGH (ref 70–99)
Potassium: 3.2 mmol/L — ABNORMAL LOW (ref 3.5–5.1)
Sodium: 132 mmol/L — ABNORMAL LOW (ref 135–145)
Total Bilirubin: <0.2 mg/dL (ref 0.0–1.2)
Total Protein: 5 g/dL — ABNORMAL LOW (ref 6.5–8.1)

## 2024-09-18 LAB — MAGNESIUM: Magnesium: 2.1 mg/dL (ref 1.7–2.4)

## 2024-09-18 LAB — URINE DRUG SCREEN
Amphetamines: NEGATIVE
Barbiturates: NEGATIVE
Benzodiazepines: NEGATIVE
Cocaine: NEGATIVE
Fentanyl: NEGATIVE
Methadone Scn, Ur: NEGATIVE
Opiates: NEGATIVE
Tetrahydrocannabinol: NEGATIVE

## 2024-09-18 LAB — CLOSTRIDIUM DIFFICILE BY PCR, REFLEXED
Hypervirulent Strain: NEGATIVE
Toxigenic C. Difficile by PCR: POSITIVE — AB

## 2024-09-18 LAB — PHOSPHORUS: Phosphorus: 2 mg/dL — ABNORMAL LOW (ref 2.5–4.6)

## 2024-09-18 MED ORDER — ONDANSETRON HCL 4 MG/2ML IJ SOLN
4.0000 mg | Freq: Four times a day (QID) | INTRAMUSCULAR | Status: DC | PRN
  Administered 2024-09-18 – 2024-09-20 (×3): 4 mg via INTRAVENOUS
  Filled 2024-09-18 (×3): qty 2

## 2024-09-18 MED ORDER — PANTOPRAZOLE SODIUM 40 MG PO TBEC
40.0000 mg | DELAYED_RELEASE_TABLET | Freq: Every day | ORAL | Status: DC
  Administered 2024-09-18 – 2024-09-23 (×6): 40 mg via ORAL
  Filled 2024-09-18 (×6): qty 1

## 2024-09-18 MED ORDER — LACTATED RINGERS IV SOLN: INTRAVENOUS | Status: DC

## 2024-09-18 MED ORDER — ACETAMINOPHEN 325 MG PO TABS: 650.0000 mg | ORAL_TABLET | Freq: Four times a day (QID) | ORAL | Status: DC | PRN

## 2024-09-18 MED ORDER — LORAZEPAM 1 MG PO TABS: 1.0000 mg | ORAL_TABLET | ORAL | Status: AC | PRN

## 2024-09-18 MED ORDER — LORAZEPAM 2 MG/ML IJ SOLN
0.5000 mg | Freq: Once | INTRAMUSCULAR | Status: AC
  Administered 2024-09-18: 0.5 mg via INTRAVENOUS
  Filled 2024-09-18: qty 1

## 2024-09-18 MED ORDER — SERTRALINE HCL 50 MG PO TABS
100.0000 mg | ORAL_TABLET | Freq: Every day | ORAL | Status: DC
  Administered 2024-09-18 – 2024-09-23 (×6): 100 mg via ORAL
  Filled 2024-09-18: qty 2
  Filled 2024-09-18: qty 4
  Filled 2024-09-18 (×4): qty 2

## 2024-09-18 MED ORDER — ENSURE PLUS HIGH PROTEIN PO LIQD
237.0000 mL | Freq: Two times a day (BID) | ORAL | Status: DC
  Administered 2024-09-18 – 2024-09-20 (×5): 237 mL via ORAL
  Filled 2024-09-18 (×4): qty 237

## 2024-09-18 MED ORDER — PIPERACILLIN-TAZOBACTAM 3.375 G IVPB
3.3750 g | Freq: Three times a day (TID) | INTRAVENOUS | Status: DC
  Administered 2024-09-18 (×2): 3.375 g via INTRAVENOUS
  Filled 2024-09-18 (×2): qty 50

## 2024-09-18 MED ORDER — ENOXAPARIN SODIUM 40 MG/0.4ML IJ SOSY
40.0000 mg | PREFILLED_SYRINGE | INTRAMUSCULAR | Status: DC
  Administered 2024-09-18 – 2024-09-22 (×5): 40 mg via SUBCUTANEOUS
  Filled 2024-09-18 (×6): qty 0.4

## 2024-09-18 MED ORDER — LACTATED RINGERS IV BOLUS
1000.0000 mL | Freq: Once | INTRAVENOUS | Status: AC
  Administered 2024-09-18: 1000 mL via INTRAVENOUS

## 2024-09-18 MED ORDER — ONDANSETRON HCL 4 MG PO TABS: 4.0000 mg | ORAL_TABLET | Freq: Four times a day (QID) | ORAL | Status: DC | PRN

## 2024-09-18 MED ORDER — HYDROMORPHONE HCL 1 MG/ML IJ SOLN
0.5000 mg | INTRAMUSCULAR | Status: DC | PRN
  Administered 2024-09-18 – 2024-09-21 (×6): 0.5 mg via INTRAVENOUS
  Filled 2024-09-18 (×7): qty 0.5

## 2024-09-18 MED ORDER — LORAZEPAM 2 MG/ML IJ SOLN: 1.0000 mg | INTRAMUSCULAR | Status: AC | PRN

## 2024-09-18 MED ORDER — POTASSIUM CHLORIDE CRYS ER 20 MEQ PO TBCR
40.0000 meq | EXTENDED_RELEASE_TABLET | Freq: Three times a day (TID) | ORAL | Status: AC
  Administered 2024-09-18 – 2024-09-19 (×3): 40 meq via ORAL
  Filled 2024-09-18 (×2): qty 2
  Filled 2024-09-18: qty 4

## 2024-09-18 MED ORDER — VANCOMYCIN HCL 125 MG PO CAPS
125.0000 mg | ORAL_CAPSULE | Freq: Four times a day (QID) | ORAL | Status: DC
  Administered 2024-09-18 – 2024-09-21 (×12): 125 mg via ORAL
  Filled 2024-09-18 (×12): qty 1

## 2024-09-18 MED ORDER — SODIUM CHLORIDE 0.9 % IV SOLN
INTRAVENOUS | Status: DC
Start: 2024-09-18 — End: 2024-09-18

## 2024-09-18 MED ORDER — SODIUM CHLORIDE 0.9 % IV SOLN: INTRAVENOUS | Status: AC

## 2024-09-18 MED ORDER — POTASSIUM PHOSPHATES 15 MMOLE/5ML IV SOLN
30.0000 mmol | Freq: Once | INTRAVENOUS | Status: AC
  Administered 2024-09-18: 30 mmol via INTRAVENOUS
  Filled 2024-09-18: qty 10

## 2024-09-18 MED ORDER — ACETAMINOPHEN 650 MG RE SUPP: 650.0000 mg | Freq: Four times a day (QID) | RECTAL | Status: DC | PRN

## 2024-09-18 NOTE — Progress Notes (Signed)
   09/18/24 1343  TOC Brief Assessment  Insurance and Status Reviewed  Patient has primary care physician No (PCP list added)  Home environment has been reviewed Home with spouse  Prior level of function: independent  Prior/Current Home Services No current home services  Social Drivers of Health Review SDOH reviewed no interventions necessary  Readmission risk has been reviewed Yes  Transition of care needs no transition of care needs at this time   Inpatient Care Manager (ICM) has reviewed patient and no ICM needs have been identified at this time. We will continue to monitor patient advancement through interdisciplinary progression rounds. If new patient transition needs arise, please place a ICM consult.

## 2024-09-18 NOTE — ED Notes (Addendum)
 Alert, NAD, calm, interactive, up to b/r, steady gait, attempting stool sample. Given warm blankets. VSS. Endorses abd pain and nausea.

## 2024-09-18 NOTE — Progress Notes (Signed)
 PROGRESS NOTE    Lindsay Mccoy  FMW:[id number] DOB: Jul 08, 1986 DOA: 09/17/2024 PCP: Pcp, No   Brief Narrative:   Lindsay Mccoy is a 38 y.o. female with medical history significant of GERD, depression who presents to the emergency department due to 3-day onset of abdominal pain and diarrhea with associated chills and nausea but no vomiting or fever.  Abdominal pain was diffuse, generalized and of moderate intensity.  Diarrhea was numerous episodes daily, it was watery in nature and was without blood.  She endorsed recent completion of amoxicillin  dose regimen for dental extraction and denies any sick contact.   ED Course In the emergency department, she was tachycardic and tachypneic, but other vital signs were within normal range.  Workup in the ED showed normal CBC except for WBC of 11.9 and hemoglobin of 15.3.  BMP shows sodium 132, potassium 2.8, chloride 92, bicarb 26, blood glucose 100, BUN 15, creatinine 0.73, albumin 3.1, lipase 31, urinalysis was unimpressive for UTI.  Blood culture pending CT abdomen and pelvis with contrast showed severe circumferential colonic and rectal wall thickening with mucosal hyperemia, pericolonic inflammatory stranding, and diffuse mesenteric edema, consistent with infectious or inflammatory proctocolitis (e.g., ulcerative colitis or pseudomembranous colitis). Small pelvic ascites. No obstruction or free intraperitoneal gas to suggest perforation. Patient was treated with ceftriaxone  and Flagyl , pain medication was given, Zofran  was given due to nausea, potassium was replenished and IV hydration was provided.  TRH consulted for admission.  Patient admitted for severe colitis/dehydration.   Assessment & Plan:   Principal Problem:   Colitis Active Problems:   C. difficile colitis   Abdominal pain C. difficile colitis Severe diarrhea/dehydration Dizziness secondary to above Recent antibiotic exposure Will start p.o. vancomycin 125 mg 4 times a day for 10 days  and monitor response. Will DC Zosyn IV hydration, start normal saline 150 cc/h for another 24 hours. Continue IV Dilaudid  0.5 mg q.4h p.r.n. for moderate to severe pain Continue IV Zofran  p.r.n. Continue clear liquid diet with plan to advanced diet as tolerated F/u blood culture x2 Consider GI consult if no improvement in pain symptoms     Hypokalemia K+ 2.8 replace as needed  Hypoalbuminemia possibly secondary to mild protein calorie malnutrition Albumin 3.1, protein supplement will be provided   Presumed cystitis CT abdomen and pelvis was suggestive of infectious/inflammatory cystitis Patient denies any irritative bladder symptoms Urinalysis was unimpressive for UTI.  Findings were likely secondary to C. difficile colitis. Will DC antibiotics, continue vancomycin for C. Difficile  Hepatic hemangioma CT abdomen and pelvis demonstrated stable left hepatic hemangioma including a dominant 5.2 x 7.0 cm lesion with characteristic peripheral nodular enhancement No follow-up recommended by radiologist   Right adrenal adenoma CT abdomen and pelvis showed stable 2.1 cm right adrenal lesion most consistent with an adenoma given his long-term stability. Adrenal  washout CT or chemical-shift MRI for characterization, or 1-year follow-up adrenal washout CT was recommended if desired by radiologist   GERD Continue Protonix    Depression Continue Zoloft    Advance Care Planning: Full code   Consults: None   Family Communication: None at bedside   Severity of Illness: The appropriate patient status for this patient is INPATIENT. Inpatient status is judged to be reasonable and necessary in order to provide the required intensity of service to ensure the patient's safety. The patient's presenting symptoms, physical exam findings, and initial radiographic and laboratory data in the context of their chronic comorbidities is felt to place them at high risk for  further clinical deterioration.  Furthermore, it is not anticipated that the patient will be medically stable for discharge from the hospital within 2 midnights of admission.    * I certify that at the point of admission it is my clinical judgment that the patient will require inpatient hospital care spanning beyond 2 midnights from the point of admission due to high intensity of service, high risk for further deterioration and high frequency of surveillance required.*   Subjective:  Patient seen and examined at the bedside.  She continues to have loose diarrhea.  Feels dehydrated.  Vital signs are stable.  Afebrile.  Continues to have abdominal discomfort requiring IV Dilaudid .  Objective: Vitals:   09/18/24 1015 09/18/24 1030 09/18/24 1045 09/18/24 1118  BP:  119/79  (!) 120/48  Pulse: (!) 121 (!) 119 (!) 123 (!) 109  Resp: 18 16 17 16   Temp:    97.8 F (36.6 C)  TempSrc:    Oral  SpO2: 95% 99% 97% 98%    Intake/Output Summary (Last 24 hours) at 09/18/2024 1409 Last data filed at 09/18/2024 0858 Gross per 24 hour  Intake 1100 ml  Output --  Net 1100 ml   There were no vitals filed for this visit.  Examination:  General exam: Alert, oriented, having abdominal pain. Respiratory system: Bilateral decreased breath sounds at bases Cardiovascular system: S1 & S2 heard, Rate controlled Gastrointestinal system: Abdomen is soft, tender to touch Extremities: No cyanosis, clubbing, edema  Central nervous system: Alert and oriented. No focal neurological deficits. Moving extremities Skin: No rashes, lesions or ulcers Psychiatry: Judgement and insight appear normal. Mood & affect appropriate.     Data Reviewed: I have personally reviewed following labs and imaging studies  CBC: Recent Labs  Lab 09/17/24 1946 09/18/24 0336  WBC 11.9* 9.3  HGB 15.3* 12.8  HCT 43.1 36.7  MCV 87.4 89.3  PLT 356 287   Basic Metabolic Panel: Recent Labs  Lab 09/17/24 1946 09/18/24 0336  NA 132* 132*  K 2.8* 3.2*  CL 92*  97*  CO2 26 27  GLUCOSE 100* 101*  BUN 15 11  CREATININE 0.73 0.59  CALCIUM 8.3* 7.3*  MG  --  2.1  PHOS  --  2.0*   GFR: CrCl cannot be calculated (Unknown ideal weight.). Liver Function Tests: Recent Labs  Lab 09/17/24 1946 09/18/24 0336  AST 22 17  ALT 15 11  ALKPHOS 92 85  BILITOT 0.3 <0.2  PROT 6.5 5.0*  ALBUMIN 3.1* 2.5*   Recent Labs  Lab 09/17/24 1946  LIPASE 31   No results for input(s): AMMONIA in the last 168 hours. Coagulation Profile: No results for input(s): INR, PROTIME in the last 168 hours. Cardiac Enzymes: No results for input(s): CKTOTAL, CKMB, CKMBINDEX, TROPONINI in the last 168 hours. BNP (last 3 results) No results for input(s): PROBNP in the last 8760 hours. HbA1C: No results for input(s): HGBA1C in the last 72 hours. CBG: No results for input(s): GLUCAP in the last 168 hours. Lipid Profile: No results for input(s): CHOL, HDL, LDLCALC, TRIG, CHOLHDL, LDLDIRECT in the last 72 hours. Thyroid  Function Tests: No results for input(s): TSH, T4TOTAL, FREET4, T3FREE, THYROIDAB in the last 72 hours. Anemia Panel: No results for input(s): VITAMINB12, FOLATE, FERRITIN, TIBC, IRON , RETICCTPCT in the last 72 hours. Sepsis Labs: No results for input(s): PROCALCITON, LATICACIDVEN in the last 168 hours.  Recent Results (from the past 240 hours)  Culture, blood (routine x 2)     Status: None (  Preliminary result)   Collection Time: 09/17/24 11:12 PM   Specimen: BLOOD  Result Value Ref Range Status   Specimen Description BLOOD BLOOD RIGHT ARM  Final   Special Requests   Final    BOTTLES DRAWN AEROBIC AND ANAEROBIC Blood Culture adequate volume   Culture   Final    NO GROWTH < 12 HOURS Performed at [facility], [address] [place], [place] [address]    Report Status PENDING  Incomplete  Culture, blood (routine x 2)     Status: None (Preliminary result)   Collection Time: 09/17/24 11:20 PM    Specimen: BLOOD  Result Value Ref Range Status   Specimen Description BLOOD BLOOD RIGHT Yazzie  Final   Special Requests   Final    BOTTLES DRAWN AEROBIC AND ANAEROBIC Blood Culture adequate volume   Culture   Final    NO GROWTH < 12 HOURS Performed at [facility], [address], [place], [place] [address]    Report Status PENDING  Incomplete  C Difficile Quick Screen w PCR reflex     Status: Abnormal   Collection Time: 09/18/24  9:20 AM   Specimen: STOOL  Result Value Ref Range Status   C Diff antigen POSITIVE (A) NEGATIVE Final   C Diff toxin NEGATIVE NEGATIVE Final   C Diff interpretation Results are indeterminate. See PCR results.  Final    Comment: Performed at [facility], [address] [place], [place] [address]         Radiology Studies: CT ABDOMEN PELVIS W CONTRAST Result Date: 09/17/2024 EXAM: CT ABDOMEN AND PELVIS WITH CONTRAST 09/17/2024 10:01:26 PM TECHNIQUE: CT of the abdomen and pelvis was performed with the administration of 100 mL of iohexol  (OMNIPAQUE ) 300 MG/ML solution. Multiplanar reformatted images are provided for review. Automated exposure control, iterative reconstruction, and/or weight-based adjustment of the mA/kV was utilized to reduce the radiation dose to as low as reasonably achievable. COMPARISON: Prior examinations of 02/02/2021 and 08/21/2019. CLINICAL HISTORY: Abdominal pain, acute, nonlocalized. Pt reports diarrhea since Sunday night. Also c/o abd pain, weakness. Pt reports nausea but no vomiting. FINDINGS: LOWER CHEST: No acute abnormality. LIVER: A stable 5.2 x 7.0 cm solid mass is seen within the lateral segment of the left hepatic lobe, better characterized on prior examinations of 02/02/2021 and 08/21/2019 where this was seen to demonstrate peripheral nodular enhancement and is compatible with a benign cavernous hemangioma. A smaller adjacent similar lesion is also stable measuring 1 cm within segment 4. Liver otherwise unremarkable. GALLBLADDER  AND BILE DUCTS: Gallbladder is unremarkable. No biliary ductal dilatation. SPLEEN: No acute abnormality. PANCREAS: No acute abnormality. 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 No follow up imaging is recommended for this lesion. Left adrenal gland is unremarkable. KIDNEYS, URETERS AND BLADDER: No stones in the kidneys or ureters. No hydronephrosis. No perinephric or periureteral stranding. There is circumferential bladder wall thickening and perivesicular inflammatory stranding in keeping with an infectious or inflammatory cystitis. The bladder is not distended. GI AND BOWEL: Stomach and small bowel are unremarkable. There is severe circumferential bowel wall thickening, mucosal hyperemia, periluminal inflammatory stranding and widespread mesenteric edema involving the entire colon and rectum in keeping with an infectious or inflammatory proctocolitis such as ulcerative colitis or pseudomembranous colitis. No evidence of  obstruction. Appendix normal. PERITONEUM AND RETROPERITONEUM: Small free intraperitoneal fluid is seen within the pelvis. No free intraperitoneal gas. VASCULATURE: Aorta is normal in caliber. LYMPH NODES: No lymphadenopathy. REPRODUCTIVE ORGANS: Status post hysterectomy. No adnexal masses are seen. BONES AND SOFT TISSUES: No acute osseous abnormality. No focal soft tissue abnormality. IMPRESSION: 1. Severe circumferential colonic and rectal wall thickening with mucosal hyperemia, pericolonic inflammatory stranding, and diffuse mesenteric edema, consistent with infectious or inflammatory proctocolitis (e.g., ulcerative colitis or pseudomembranous colitis). Small pelvic ascites. No obstruction or free intraperitoneal gas to suggest perforation . 2.  Circumferential bladder wall thickening with perivesical inflammatory stranding, consistent with infectious or inflammatory cystitis. Bladder underdistention may contribute. correlation with urinalysis and urine culture may be helpful for further management. 3. Stable left hepatic hemangiomas, including a dominant 5.2 x 7.0 cm lesion with characteristic peripheral nodular enhancement; no follow-up recommended. 4. Stable 2.1 cm right adrenal lesion measuring 20 HU on noncontrast CT, most consistent with an adenoma given its long-term stability. If desired, this could be confirmed with adrenal washout CT or chemical-shift MRI for characterization, or 1-year follow-up adrenal washout CT. Electronically signed by: Dorethia Molt MD 09/17/2024 10:14 PM EDT RP Workstation: [id number]        Scheduled Meds:  enoxaparin (LOVENOX) injection  40 mg Subcutaneous Q24H   feeding supplement  237 mL Oral BID BM   pantoprazole   40 mg Oral Daily   sertraline  100 mg Oral Daily   vancomycin  125 mg Oral QID   Continuous Infusions:  sodium chloride      piperacillin-tazobactam (ZOSYN)  IV 3.375 g (09/18/24 1358)          Brinlee Gambrell, MD [facility] 09/18/2024, 2:09 PM

## 2024-09-18 NOTE — Progress Notes (Signed)
 Patient tachycardic, certainly could be from diarrhea but concern for withdrawal.  Per staff information, her husband said he found some crack/cocaine in her purse and flushed it.  Will get urine toxicology CIWA with Ativan

## 2024-09-18 NOTE — Telephone Encounter (Signed)
 Patient Product/process Development Scientist completed.    The patient is insured through [facility].     Ran test claim for vancomycin 125 mg and the current 10 day co-pay is $4.00.  Ran test claim for Dificid 200 mg and Requires Prior Authorization    This test claim was processed through [employer]- copay amounts may vary at other pharmacies due to [employer], or as the patient moves through the different stages of their insurance plan.     Reyes Sharps, CPHT Pharmacy Technician Patient Advocate Specialist Lead [facility] Patient Advocate Team Direct Number: [phone]  Fax: [phone]

## 2024-09-19 DIAGNOSIS — K529 Noninfective gastroenteritis and colitis, unspecified: Secondary | ICD-10-CM | POA: Diagnosis not present

## 2024-09-19 LAB — GASTROINTESTINAL PANEL BY PCR, STOOL (REPLACES STOOL CULTURE)

## 2024-09-19 LAB — MAGNESIUM: Magnesium: 2 mg/dL (ref 1.7–2.4)

## 2024-09-19 LAB — BASIC METABOLIC PANEL WITH GFR
Anion gap: 8 (ref 5–15)
BUN: <5 mg/dL — ABNORMAL LOW (ref 6–20)
CO2: 27 mmol/L (ref 22–32)
Calcium: 7.8 mg/dL — ABNORMAL LOW (ref 8.9–10.3)
Chloride: 103 mmol/L (ref 98–111)
Creatinine, Ser: 0.4 mg/dL — ABNORMAL LOW (ref 0.44–1.00)
GFR, Estimated: >60 mL/min (ref >60)
Glucose, Bld: 79 mg/dL (ref 70–99)
Potassium: 3.9 mmol/L (ref 3.5–5.1)
Sodium: 138 mmol/L (ref 135–145)

## 2024-09-19 NOTE — Progress Notes (Signed)
 PROGRESS NOTE    Lindsay Mccoy  FMW:[id number] DOB: [birth year] DOA: 09/17/2024 PCP: Pcp, No   Brief Narrative:   Lindsay Mccoy is a 38 y.o. female with medical history significant of GERD, depression who presents to the emergency department due to 3-day onset of abdominal pain and diarrhea with associated chills and nausea but no vomiting or fever.  Abdominal pain was diffuse, generalized and of moderate intensity.  Diarrhea was numerous episodes daily, it was watery in nature and was without blood.  She endorsed recent completion of amoxicillin  dose regimen for dental extraction and denies any sick contact.   ED Course In the emergency department, she was tachycardic and tachypneic, but other vital signs were within normal range.  Workup in the ED showed normal CBC except for WBC of 11.9 and hemoglobin of 15.3.  BMP shows sodium 132, potassium 2.8, chloride 92, bicarb 26, blood glucose 100, BUN 15, creatinine 0.73, albumin 3.1, lipase 31, urinalysis was unimpressive for UTI.  Blood culture pending CT abdomen and pelvis with contrast showed severe circumferential colonic and rectal wall thickening with mucosal hyperemia, pericolonic inflammatory stranding, and diffuse mesenteric edema, consistent with infectious or inflammatory proctocolitis (e.g., ulcerative colitis or pseudomembranous colitis). Small pelvic ascites. No obstruction or free intraperitoneal gas to suggest perforation. Patient was treated with ceftriaxone  and Flagyl , pain medication was given, Zofran  was given due to nausea, potassium was replenished and IV hydration was provided.  TRH consulted for admission.  Patient admitted for severe colitis/dehydration.   Assessment & Plan:   Principal Problem:   Colitis Active Problems:   C. difficile colitis   Abdominal pain C. difficile colitis Severe diarrhea/dehydration Dizziness secondary to above Recent antibiotic exposure 10/30: Started p.o. vancomycin 125 mg 4 times a day for 10  days and monitor response. Continue normal saline, decrease rate to 75 cc/h.   Continue IV Dilaudid  0.5 mg q.4h p.r.n. for moderate to severe pain Continue IV Zofran  p.r.n. Continue clear liquid diet with plan to advanced diet as tolerated F/u blood culture x2 Consider GI consult if no improvement in pain symptoms     Hypokalemia K+ 2.8 on presentation replace as needed  Tachycardia: Likely from diarrhea.  There was concern for some drug use however U tox is negative.  Hypoalbuminemia possibly secondary to mild protein calorie malnutrition Albumin 3.1, protein supplement will be provided   Presumed cystitis CT abdomen and pelvis was suggestive of infectious/inflammatory cystitis Patient denies any irritative bladder symptoms Urinalysis was unimpressive for UTI.  Findings were likely secondary to C. difficile colitis. Will DC antibiotics, continue vancomycin for C. Difficile  Hepatic hemangioma CT abdomen and pelvis demonstrated stable left hepatic hemangioma including a dominant 5.2 x 7.0 cm lesion with characteristic peripheral nodular enhancement No follow-up recommended by radiologist   Right adrenal adenoma CT abdomen and pelvis showed stable 2.1 cm right adrenal lesion most consistent with an adenoma given his long-term stability. Adrenal  washout CT or chemical-shift MRI for characterization, or 1-year follow-up adrenal washout CT was recommended if desired by radiologist   GERD Continue Protonix    Depression Continue Zoloft    Advance Care Planning: Full code   Consults: None   Family Communication: None at bedside   Severity of Illness: The appropriate patient status for this patient is INPATIENT. Inpatient status is judged to be reasonable and necessary in order to provide the required intensity of service to ensure the patient's safety. The patient's presenting symptoms, physical exam findings, and initial radiographic and laboratory data  in the context of their  chronic comorbidities is felt to place them at high risk for further clinical deterioration. Furthermore, it is not anticipated that the patient will be medically stable for discharge from the hospital within 2 midnights of admission.    * I certify that at the point of admission it is my clinical judgment that the patient will require inpatient hospital care spanning beyond 2 midnights from the point of admission due to high intensity of service, high risk for further deterioration and high frequency of surveillance required.*   Subjective:  Patient seen and examined at the bedside.  She continues to have loose diarrhea.  Sounds like the frequency had decreased slightly.  Vital signs are stable.  She is afebrile.  Objective: Vitals:   09/18/24 2059 09/18/24 2342 09/19/24 0450 09/19/24 1100  BP: 95/79 116/65 108/64   Pulse: 98 (!) 110 86   Resp: 20 14 16    Temp: 98.8 F (37.1 C) 98.1 F (36.7 C) 98.1 F (36.7 C)   TempSrc: Oral Oral Oral   SpO2: 97% 93% 98%   Weight:    61.6 kg  Height:    5' 5 (1.651 m)    Intake/Output Summary (Last 24 hours) at 09/19/2024 1413 Last data filed at 09/18/2024 2344 Gross per 24 hour  Intake 680.16 ml  Output --  Net 680.16 ml   Filed Weights   09/19/24 1100  Weight: 61.6 kg    Examination:  General exam: Alert, oriented, having abdominal pain. Respiratory system: Bilateral decreased breath sounds at bases Cardiovascular system: S1 & S2 heard, Rate controlled Gastrointestinal system: Abdomen is soft, tender to touch Extremities: No cyanosis, clubbing, edema  Central nervous system: Alert and oriented. No focal neurological deficits. Moving extremities Skin: No rashes, lesions or ulcers Psychiatry: Judgement and insight appear normal. Mood & affect appropriate.     Data Reviewed: I have personally reviewed following labs and imaging studies  CBC: Recent Labs  Lab 09/17/24 1946 09/18/24 0336  WBC 11.9* 9.3  HGB 15.3* 12.8  HCT  43.1 36.7  MCV 87.4 89.3  PLT 356 287   Basic Metabolic Panel: Recent Labs  Lab 09/17/24 1946 09/18/24 0336 09/19/24 0917  NA 132* 132* 138  K 2.8* 3.2* 3.9  CL 92* 97* 103  CO2 26 27 27   GLUCOSE 100* 101* 79  BUN 15 11 <5*  CREATININE 0.73 0.59 0.40*  CALCIUM 8.3* 7.3* 7.8*  MG  --  2.1 2.0  PHOS  --  2.0*  --    GFR: Estimated Creatinine Clearance: 85 mL/min (A) (by C-G formula based on SCr of 0.4 mg/dL (L)). Liver Function Tests: Recent Labs  Lab 09/17/24 1946 09/18/24 0336  AST 22 17  ALT 15 11  ALKPHOS 92 85  BILITOT 0.3 <0.2  PROT 6.5 5.0*  ALBUMIN 3.1* 2.5*   Recent Labs  Lab 09/17/24 1946  LIPASE 31   No results for input(s): AMMONIA in the last 168 hours. Coagulation Profile: No results for input(s): INR, PROTIME in the last 168 hours. Cardiac Enzymes: No results for input(s): CKTOTAL, CKMB, CKMBINDEX, TROPONINI in the last 168 hours. BNP (last 3 results) No results for input(s): PROBNP in the last 8760 hours. HbA1C: No results for input(s): HGBA1C in the last 72 hours. CBG: No results for input(s): GLUCAP in the last 168 hours. Lipid Profile: No results for input(s): CHOL, HDL, LDLCALC, TRIG, CHOLHDL, LDLDIRECT in the last 72 hours. Thyroid  Function Tests: No results for input(s): TSH,  T4TOTAL, FREET4, T3FREE, THYROIDAB in the last 72 hours. Anemia Panel: No results for input(s): VITAMINB12, FOLATE, FERRITIN, TIBC, IRON , RETICCTPCT in the last 72 hours. Sepsis Labs: No results for input(s): PROCALCITON, LATICACIDVEN in the last 168 hours.  Recent Results (from the past 240 hours)  Culture, blood (routine x 2)     Status: None (Preliminary result)   Collection Time: 09/17/24 11:12 PM   Specimen: BLOOD  Result Value Ref Range Status   Specimen Description BLOOD BLOOD RIGHT ARM  Final   Special Requests   Final    BOTTLES DRAWN AEROBIC AND ANAEROBIC Blood Culture adequate volume    Culture   Final    NO GROWTH 2 DAYS Performed at [facility], [address] [place], [place] [address]    Report Status PENDING  Incomplete  Culture, blood (routine x 2)     Status: None (Preliminary result)   Collection Time: 09/17/24 11:20 PM   Specimen: BLOOD  Result Value Ref Range Status   Specimen Description BLOOD BLOOD RIGHT [place]  Final   Special Requests   Final    BOTTLES DRAWN AEROBIC AND ANAEROBIC Blood Culture adequate volume   Culture   Final    NO GROWTH 2 DAYS Performed at [facility], [address] [place], [place] [address]    Report Status PENDING  Incomplete  C Difficile Quick Screen w PCR reflex     Status: Abnormal   Collection Time: 09/18/24  9:20 AM   Specimen: STOOL  Result Value Ref Range Status   C Diff antigen POSITIVE (A) NEGATIVE Final   C Diff toxin NEGATIVE NEGATIVE Final   C Diff interpretation Results are indeterminate. See PCR results.  Final    Comment: Performed at [facility], [address] [place], [place] [address]  Gastrointestinal Panel by PCR , Stool     Status: None   Collection Time: 09/18/24  9:20 AM   Specimen: STOOL  Result Value Ref Range Status   Campylobacter species NOT DETECTED NOT DETECTED Final   Plesimonas shigelloides NOT DETECTED NOT DETECTED Final   Salmonella species NOT DETECTED NOT DETECTED Final   Yersinia enterocolitica NOT DETECTED NOT DETECTED Final   Vibrio species NOT DETECTED NOT DETECTED Final   Vibrio cholerae NOT DETECTED NOT DETECTED Final   Enteroaggregative E coli (EAEC) NOT DETECTED NOT DETECTED Final   Enteropathogenic E coli (EPEC) NOT DETECTED NOT DETECTED Final   Enterotoxigenic E coli (ETEC) NOT DETECTED NOT DETECTED Final   Shiga like toxin producing E coli (STEC) NOT DETECTED NOT DETECTED Final   Shigella/Enteroinvasive E coli (EIEC) NOT DETECTED NOT DETECTED Final   Cryptosporidium NOT DETECTED NOT DETECTED Final   Cyclospora cayetanensis NOT DETECTED NOT DETECTED Final   Entamoeba  histolytica NOT DETECTED NOT DETECTED Final   Giardia lamblia NOT DETECTED NOT DETECTED Final   Adenovirus F40/41 NOT DETECTED NOT DETECTED Final   Astrovirus NOT DETECTED NOT DETECTED Final   Norovirus GI/GII NOT DETECTED NOT DETECTED Final   Rotavirus A NOT DETECTED NOT DETECTED Final   Sapovirus (I, II, IV, and V) NOT DETECTED NOT DETECTED Final    Comment: Performed at [facility], [address] [place], [place] [address]  C. Diff by PCR, Reflexed     Status: Abnormal   Collection Time: 09/18/24  9:20 AM  Result Value Ref Range Status   Toxigenic C. Difficile by PCR POSITIVE (A) NEGATIVE Final    Comment: Positive for toxigenic C. difficile with little  to no toxin production. Only treat if clinical presentation suggests symptomatic illness.   Hypervirulent Strain PRESUMPTIVE NEGATIVE PRESUMPTIVE NEGATIVE Final    Comment: Performed at [facility], [address] [place], [place] [address]         Radiology Studies: CT ABDOMEN PELVIS W CONTRAST Result Date: 09/17/2024 EXAM: CT ABDOMEN AND PELVIS WITH CONTRAST 09/17/2024 10:01:26 PM TECHNIQUE: CT of the abdomen and pelvis was performed with the administration of 100 mL of iohexol  (OMNIPAQUE ) 300 MG/ML solution. Multiplanar reformatted images are provided for review. Automated exposure control, iterative reconstruction, and/or weight-based adjustment of the mA/kV was utilized to reduce the radiation dose to as low as reasonably achievable. COMPARISON: Prior examinations of 02/02/2021 and 08/21/2019. CLINICAL HISTORY: Abdominal pain, acute, nonlocalized. Pt reports diarrhea since Sunday night. Also c/o abd pain, weakness. Pt reports nausea but no vomiting. FINDINGS: LOWER CHEST: No acute abnormality. LIVER: A stable 5.2 x 7.0 cm solid mass is seen within the lateral segment of the left hepatic lobe, better characterized on prior examinations of 02/02/2021 and 08/21/2019 where this was seen to demonstrate peripheral  nodular enhancement and is compatible with a benign cavernous hemangioma. A smaller adjacent similar lesion is also stable measuring 1 cm within segment 4. Liver otherwise unremarkable. GALLBLADDER AND BILE DUCTS: Gallbladder is unremarkable. No biliary ductal dilatation. SPLEEN: No acute abnormality. PANCREAS: No acute abnormality. 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 No follow up imaging is recommended for this lesion. Left adrenal gland is unremarkable. KIDNEYS, URETERS AND BLADDER: No stones in the kidneys or ureters. No hydronephrosis. No perinephric or periureteral stranding. There is circumferential bladder wall thickening and perivesicular inflammatory stranding in keeping with an infectious or inflammatory cystitis. The bladder is not distended. GI AND BOWEL: Stomach and small bowel are unremarkable. There is severe circumferential bowel wall thickening, mucosal hyperemia, periluminal inflammatory stranding and widespread mesenteric edema involving the entire colon and rectum in keeping with an infectious or inflammatory proctocolitis such as ulcerative colitis or pseudomembranous colitis. No evidence of obstruction. Appendix normal. PERITONEUM AND RETROPERITONEUM: Small free intraperitoneal fluid is seen within the pelvis. No free intraperitoneal gas. VASCULATURE: Aorta is normal in caliber. LYMPH NODES: No lymphadenopathy. REPRODUCTIVE ORGANS: Status post hysterectomy. No adnexal masses are seen. BONES AND SOFT TISSUES: No acute osseous abnormality. No focal soft tissue abnormality. IMPRESSION: 1. Severe circumferential colonic and rectal wall thickening with mucosal hyperemia, pericolonic inflammatory stranding, and diffuse mesenteric edema, consistent with  infectious or inflammatory proctocolitis (e.g., ulcerative colitis or pseudomembranous colitis). Small pelvic ascites. No obstruction or free intraperitoneal gas to suggest perforation . 2. Circumferential bladder wall thickening with perivesical inflammatory stranding, consistent with infectious or inflammatory cystitis. Bladder underdistention may contribute. correlation with urinalysis and urine culture may be helpful for further management. 3. Stable left hepatic hemangiomas, including a dominant 5.2 x 7.0 cm lesion with characteristic peripheral nodular enhancement; no follow-up recommended. 4. Stable 2.1 cm right adrenal lesion measuring 20 HU on noncontrast CT, most consistent with an adenoma given its long-term stability. If desired, this could be confirmed with adrenal washout CT or chemical-shift MRI for characterization, or 1-year follow-up adrenal washout CT. Electronically signed by: Dorethia Molt MD 09/17/2024 10:14 PM EDT RP Workstation: [id number]        Scheduled Meds:  enoxaparin (LOVENOX) injection  40 mg Subcutaneous Q24H   feeding supplement  237 mL Oral BID BM   pantoprazole   40 mg Oral Daily   sertraline  100 mg Oral Daily   vancomycin  125 mg Oral QID   Continuous Infusions:  lactated ringers  125 mL/hr at 09/18/24 1555          Derryl Duval, MD [facility] 09/19/2024, 2:13 PM

## 2024-09-19 NOTE — Plan of Care (Signed)
  Problem: Education: Goal: Knowledge of General Education information will improve Description: Including pain rating scale, medication(s)/side effects and non-pharmacologic comfort measures Outcome: Progressing   Problem: Clinical Measurements: Goal: Ability to maintain clinical measurements within normal limits will improve Outcome: Progressing   Problem: Health Behavior/Discharge Planning: Goal: Ability to manage health-related needs will improve Outcome: Progressing   Problem: Nutrition: Goal: Adequate nutrition will be maintained Outcome: Progressing   Problem: Coping: Goal: Level of anxiety will decrease Outcome: Progressing

## 2024-09-20 DIAGNOSIS — K529 Noninfective gastroenteritis and colitis, unspecified: Secondary | ICD-10-CM | POA: Diagnosis not present

## 2024-09-20 NOTE — Plan of Care (Signed)

## 2024-09-20 NOTE — Plan of Care (Signed)
  Problem: Education: Goal: Knowledge of General Education information will improve Description: Including pain rating scale, medication(s)/side effects and non-pharmacologic comfort measures Outcome: Progressing   Problem: Health Behavior/Discharge Planning: Goal: Ability to manage health-related needs will improve Outcome: Progressing   Problem: Clinical Measurements: Goal: Ability to maintain clinical measurements within normal limits will improve Outcome: Progressing   Problem: Clinical Measurements: Goal: Diagnostic test results will improve Outcome: Progressing   Problem: Activity: Goal: Risk for activity intolerance will decrease Outcome: Progressing   Problem: Nutrition: Goal: Adequate nutrition will be maintained Outcome: Progressing   Problem: Coping: Goal: Level of anxiety will decrease Outcome: Progressing

## 2024-09-20 NOTE — Progress Notes (Signed)
 PROGRESS NOTE    Lindsay Mccoy  FMW:[id number] DOB: 11-01-1986 DOA: 09/17/2024 PCP: Pcp, No   Brief Narrative:   Lindsay Mccoy is a 38 y.o. female with medical history significant of GERD, depression who presents to the emergency department due to 3-day onset of abdominal pain and diarrhea with associated chills and nausea but no vomiting or fever.  Abdominal pain was diffuse, generalized and of moderate intensity.  Diarrhea was numerous episodes daily, it was watery in nature and was without blood.  She endorsed recent completion of amoxicillin  dose regimen for dental extraction and denies any sick contact.   ED Course In the emergency department, she was tachycardic and tachypneic, but other vital signs were within normal range.  Workup in the ED showed normal CBC except for WBC of 11.9 and hemoglobin of 15.3.  BMP shows sodium 132, potassium 2.8, chloride 92, bicarb 26, blood glucose 100, BUN 15, creatinine 0.73, albumin 3.1, lipase 31, urinalysis was unimpressive for UTI.  Blood culture pending CT abdomen and pelvis with contrast showed severe circumferential colonic and rectal wall thickening with mucosal hyperemia, pericolonic inflammatory stranding, and diffuse mesenteric edema, consistent with infectious or inflammatory proctocolitis (e.g., ulcerative colitis or pseudomembranous colitis). Small pelvic ascites. No obstruction or free intraperitoneal gas to suggest perforation. Patient was treated with ceftriaxone  and Flagyl , pain medication was given, Zofran  was given due to nausea, potassium was replenished and IV hydration was provided.  TRH consulted for admission.  Patient admitted for severe colitis/dehydration.   Assessment & Plan:   Principal Problem:   Colitis Active Problems:   C. difficile colitis   Abdominal pain C. difficile colitis Severe diarrhea/dehydration Dizziness secondary to above Recent antibiotic exposure 10/30: Started p.o. vancomycin 125 mg 4 times a day for 10  days and monitor response. Slow improvement Continue normal saline, decrease rate to 75 cc/h.   Continue IV Dilaudid  0.5 mg q.4h p.r.n. for moderate to severe pain Continue IV Zofran  p.r.n. Diet advanced.  F/u blood culture x2 is negative Consider GI consult if no improvement in pain symptoms     Hypokalemia K+ 2.8 on presentation, resolved after replacement replace as needed  Tachycardia: Likely from diarrhea.  There was concern for some drug use however U tox is negative.  Tachycardia has resolved.  Hypoalbuminemia possibly secondary to mild protein calorie malnutrition Albumin 3.1, protein supplement will be provided   Presumed cystitis CT abdomen and pelvis was suggestive of infectious/inflammatory cystitis Patient denies any irritative bladder symptoms Urinalysis was unimpressive for UTI.  Findings were likely secondary to C. difficile colitis. Will DC antibiotics, continue vancomycin for C. Difficile  Hepatic hemangioma CT abdomen and pelvis demonstrated stable left hepatic hemangioma including a dominant 5.2 x 7.0 cm lesion with characteristic peripheral nodular enhancement No follow-up recommended by radiologist   Right adrenal adenoma CT abdomen and pelvis showed stable 2.1 cm right adrenal lesion most consistent with an adenoma given his long-term stability. Adrenal  washout CT or chemical-shift MRI for characterization, or 1-year follow-up adrenal washout CT was recommended if desired by radiologist   GERD Continue Protonix    Depression Continue Zoloft    Advance Care Planning: Full code   Consults: None   Family Communication: None at bedside   Severity of Illness: The appropriate patient status for this patient is INPATIENT. Inpatient status is judged to be reasonable and necessary in order to provide the required intensity of service to ensure the patient's safety. The patient's presenting symptoms, physical exam findings, and initial radiographic  and  laboratory data in the context of their chronic comorbidities is felt to place them at high risk for further clinical deterioration. Furthermore, it is not anticipated that the patient will be medically stable for discharge from the hospital within 2 midnights of admission.    * I certify that at the point of admission it is my clinical judgment that the patient will require inpatient hospital care spanning beyond 2 midnights from the point of admission due to high intensity of service, high risk for further deterioration and high frequency of surveillance required.*   Subjective:  Patient seen and examined at the bedside.  She continues to have loose diarrhea.  Sounds like the frequency had decreased slightly.  She continues to complain of abdominal pain however on exam she is nontender.  Vital signs are stable.  She is afebrile.  Objective: Vitals:   09/19/24 2023 09/20/24 0512 09/20/24 1251 09/20/24 1256  BP: 104/64 (!) 107/59 112/70 109/70  Pulse: 87 78 (!) 110 77  Resp: 18 17 20 18   Temp:  97.9 F (36.6 C) 98.5 F (36.9 C) 98.2 F (36.8 C)  TempSrc:  Oral Oral Oral  SpO2: 96% 99% 98% 97%  Weight:      Height:        Intake/Output Summary (Last 24 hours) at 09/20/2024 1405 Last data filed at 09/20/2024 0900 Gross per 24 hour  Intake 2480 ml  Output --  Net 2480 ml   Filed Weights   09/19/24 1100  Weight: 61.6 kg    Examination:  General exam: Alert, oriented, having abdominal pain. Respiratory system: Bilateral decreased breath sounds at bases Cardiovascular system: S1 & S2 heard, Rate controlled Gastrointestinal system: Abdomen is soft,non tender to touch Extremities: No cyanosis, clubbing, edema  Central nervous system: Alert and oriented. No focal neurological deficits. Moving extremities Skin: No rashes, lesions or ulcers Psychiatry: Judgement and insight appear normal. Mood & affect appropriate.     Data Reviewed: I have personally reviewed following labs and  imaging studies  CBC: Recent Labs  Lab 09/17/24 1946 09/18/24 0336  WBC 11.9* 9.3  HGB 15.3* 12.8  HCT 43.1 36.7  MCV 87.4 89.3  PLT 356 287   Basic Metabolic Panel: Recent Labs  Lab 09/17/24 1946 09/18/24 0336 09/19/24 0917  NA 132* 132* 138  K 2.8* 3.2* 3.9  CL 92* 97* 103  CO2 26 27 27   GLUCOSE 100* 101* 79  BUN 15 11 <5*  CREATININE 0.73 0.59 0.40*  CALCIUM 8.3* 7.3* 7.8*  MG  --  2.1 2.0  PHOS  --  2.0*  --    GFR: Estimated Creatinine Clearance: 85 mL/min (A) (by C-G formula based on SCr of 0.4 mg/dL (L)). Liver Function Tests: Recent Labs  Lab 09/17/24 1946 09/18/24 0336  AST 22 17  ALT 15 11  ALKPHOS 92 85  BILITOT 0.3 <0.2  PROT 6.5 5.0*  ALBUMIN 3.1* 2.5*   Recent Labs  Lab 09/17/24 1946  LIPASE 31   No results for input(s): AMMONIA in the last 168 hours. Coagulation Profile: No results for input(s): INR, PROTIME in the last 168 hours. Cardiac Enzymes: No results for input(s): CKTOTAL, CKMB, CKMBINDEX, TROPONINI in the last 168 hours. BNP (last 3 results) No results for input(s): PROBNP in the last 8760 hours. HbA1C: No results for input(s): HGBA1C in the last 72 hours. CBG: No results for input(s): GLUCAP in the last 168 hours. Lipid Profile: No results for input(s): CHOL, HDL, LDLCALC, TRIG, CHOLHDL,  LDLDIRECT in the last 72 hours. Thyroid  Function Tests: No results for input(s): TSH, T4TOTAL, FREET4, T3FREE, THYROIDAB in the last 72 hours. Anemia Panel: No results for input(s): VITAMINB12, FOLATE, FERRITIN, TIBC, IRON , RETICCTPCT in the last 72 hours. Sepsis Labs: No results for input(s): PROCALCITON, LATICACIDVEN in the last 168 hours.  Recent Results (from the past 240 hours)  Culture, blood (routine x 2)     Status: None (Preliminary result)   Collection Time: 09/17/24 11:12 PM   Specimen: BLOOD  Result Value Ref Range Status   Specimen Description BLOOD BLOOD RIGHT ARM   Final   Special Requests   Final    BOTTLES DRAWN AEROBIC AND ANAEROBIC Blood Culture adequate volume   Culture   Final    NO GROWTH 3 DAYS Performed at [facility], [address] [place], [place] [address]    Report Status PENDING  Incomplete  Culture, blood (routine x 2)     Status: None (Preliminary result)   Collection Time: 09/17/24 11:20 PM   Specimen: BLOOD  Result Value Ref Range Status   Specimen Description BLOOD BLOOD RIGHT [place]  Final   Special Requests   Final    BOTTLES DRAWN AEROBIC AND ANAEROBIC Blood Culture adequate volume   Culture   Final    NO GROWTH 3 DAYS Performed at [facility], [address], [place], [place] [address]    Report Status PENDING  Incomplete  C Difficile Quick Screen w PCR reflex     Status: Abnormal   Collection Time: 09/18/24  9:20 AM   Specimen: STOOL  Result Value Ref Range Status   C Diff antigen POSITIVE (A) NEGATIVE Final   C Diff toxin NEGATIVE NEGATIVE Final   C Diff interpretation Results are indeterminate. See PCR results.  Final    Comment: Performed at [facility], [address], [place], [place] [address]  Gastrointestinal Panel by PCR , Stool     Status: None   Collection Time: 09/18/24  9:20 AM   Specimen: STOOL  Result Value Ref Range Status   Campylobacter species NOT DETECTED NOT DETECTED Final   Plesimonas shigelloides NOT DETECTED NOT DETECTED Final   Salmonella species NOT DETECTED NOT DETECTED Final   Yersinia enterocolitica NOT DETECTED NOT DETECTED Final   Vibrio species NOT DETECTED NOT DETECTED Final   Vibrio cholerae NOT DETECTED NOT DETECTED Final   Enteroaggregative E coli (EAEC) NOT DETECTED NOT DETECTED Final   Enteropathogenic E coli (EPEC) NOT DETECTED NOT DETECTED Final   Enterotoxigenic E coli (ETEC) NOT DETECTED NOT DETECTED Final   Shiga like toxin producing E coli (STEC) NOT DETECTED NOT DETECTED Final   Shigella/Enteroinvasive E coli (EIEC) NOT DETECTED NOT DETECTED Final   Cryptosporidium  NOT DETECTED NOT DETECTED Final   Cyclospora cayetanensis NOT DETECTED NOT DETECTED Final   Entamoeba histolytica NOT DETECTED NOT DETECTED Final   Giardia lamblia NOT DETECTED NOT DETECTED Final   Adenovirus F40/41 NOT DETECTED NOT DETECTED Final   Astrovirus NOT DETECTED NOT DETECTED Final   Norovirus GI/GII NOT DETECTED NOT DETECTED Final   Rotavirus A NOT DETECTED NOT DETECTED Final   Sapovirus (I, II, IV, and V) NOT DETECTED NOT DETECTED Final    Comment: Performed at [facility], [address] [place], [place] [address]  C. Diff by PCR, Reflexed     Status: Abnormal   Collection Time: 09/18/24  9:20 AM  Result Value Ref Range Status   Toxigenic C. Difficile by PCR POSITIVE (  A) NEGATIVE Final    Comment: Positive for toxigenic C. difficile with little to no toxin production. Only treat if clinical presentation suggests symptomatic illness.   Hypervirulent Strain PRESUMPTIVE NEGATIVE PRESUMPTIVE NEGATIVE Final    Comment: Performed at [facility], [address] [place], [place] [address]         Radiology Studies: No results found.       Scheduled Meds:  enoxaparin (LOVENOX) injection  40 mg Subcutaneous Q24H   feeding supplement  237 mL Oral BID BM   pantoprazole   40 mg Oral Daily   sertraline  100 mg Oral Daily   vancomycin  125 mg Oral QID   Continuous Infusions:  lactated ringers  75 mL/hr at 09/20/24 0420          Derryl Duval, MD [facility] 09/20/2024, 2:05 PM

## 2024-09-21 DIAGNOSIS — K529 Noninfective gastroenteritis and colitis, unspecified: Secondary | ICD-10-CM | POA: Diagnosis not present

## 2024-09-21 LAB — BASIC METABOLIC PANEL WITH GFR
Anion gap: 9 (ref 5–15)
BUN: <5 mg/dL — ABNORMAL LOW (ref 6–20)
CO2: 27 mmol/L (ref 22–32)
Calcium: 8.3 mg/dL — ABNORMAL LOW (ref 8.9–10.3)
Chloride: 102 mmol/L (ref 98–111)
Creatinine, Ser: 0.51 mg/dL (ref 0.44–1.00)
GFR, Estimated: >60 mL/min (ref >60)
Glucose, Bld: 82 mg/dL (ref 70–99)
Potassium: 4 mmol/L (ref 3.5–5.1)
Sodium: 138 mmol/L (ref 135–145)

## 2024-09-21 LAB — MAGNESIUM: Magnesium: 1.9 mg/dL (ref 1.7–2.4)

## 2024-09-21 MED ORDER — FIDAXOMICIN 200 MG PO TABS
200.0000 mg | ORAL_TABLET | Freq: Two times a day (BID) | ORAL | Status: DC
  Administered 2024-09-21 – 2024-09-23 (×5): 200 mg via ORAL
  Filled 2024-09-21 (×5): qty 1

## 2024-09-21 NOTE — Plan of Care (Signed)

## 2024-09-21 NOTE — Progress Notes (Signed)
 PROGRESS NOTE    Lindsay Mccoy  FMW:[id number] DOB: 1986-07-15 DOA: 09/17/2024 PCP: Pcp, No   Brief Narrative:   Lindsay Mccoy is a 38 y.o. female with medical history significant of GERD, depression who presents to the emergency department due to 3-day onset of abdominal pain and diarrhea with associated chills and nausea but no vomiting or fever.  Abdominal pain was diffuse, generalized and of moderate intensity.  Diarrhea was numerous episodes daily, it was watery in nature and was without blood.  She endorsed recent completion of amoxicillin  dose regimen for dental extraction and denies any sick contact.   ED Course In the emergency department, she was tachycardic and tachypneic, but other vital signs were within normal range.  Workup in the ED showed normal CBC except for WBC of 11.9 and hemoglobin of 15.3.  BMP shows sodium 132, potassium 2.8, chloride 92, bicarb 26, blood glucose 100, BUN 15, creatinine 0.73, albumin 3.1, lipase 31, urinalysis was unimpressive for UTI.  Blood culture pending CT abdomen and pelvis with contrast showed severe circumferential colonic and rectal wall thickening with mucosal hyperemia, pericolonic inflammatory stranding, and diffuse mesenteric edema, consistent with infectious or inflammatory proctocolitis (e.g., ulcerative colitis or pseudomembranous colitis). Small pelvic ascites. No obstruction or free intraperitoneal gas to suggest perforation. Patient was treated with ceftriaxone  and Flagyl , pain medication was given, Zofran  was given due to nausea, potassium was replenished and IV hydration was provided.  TRH consulted for admission.  Patient admitted for severe colitis/dehydration, found to have C. difficile   Assessment & Plan:   Principal Problem:   Colitis Active Problems:   C. difficile colitis   Abdominal pain C. difficile colitis Severe diarrhea/dehydration Dizziness secondary to above Recent antibiotic exposure 10/30: Started p.o. vancomycin 125  mg 4 times a day for 10 days and monitor response. Will start Dificid due to continued diarrhea Patient reports abdominal pain but abdomen is benign on palpation. Continue IV hydration     Hypokalemia K+ 2.8 on presentation, resolved after replacement replace as needed  Tachycardia: Likely from diarrhea.  There was concern for some drug use however U tox is negative.  Tachycardia has resolved.  Hypoalbuminemia possibly secondary to mild protein calorie malnutrition Albumin 3.1, protein supplement will be provided   Presumed cystitis CT abdomen and pelvis was suggestive of infectious/inflammatory cystitis Patient denies any irritative bladder symptoms Urinalysis was unimpressive for UTI.  Findings were likely secondary to C. difficile colitis. Will DC antibiotics, continue vancomycin for C. Difficile  Hepatic hemangioma CT abdomen and pelvis demonstrated stable left hepatic hemangioma including a dominant 5.2 x 7.0 cm lesion with characteristic peripheral nodular enhancement No follow-up recommended by radiologist   Right adrenal adenoma CT abdomen and pelvis showed stable 2.1 cm right adrenal lesion most consistent with an adenoma given his long-term stability. Adrenal  washout CT or chemical-shift MRI for characterization, or 1-year follow-up adrenal washout CT was recommended if desired by radiologist   GERD Continue Protonix    Depression Continue Zoloft    Advance Care Planning: Full code   Consults: None   Family Communication: None at bedside   Severity of Illness: The appropriate patient status for this patient is INPATIENT. Inpatient status is judged to be reasonable and necessary in order to provide the required intensity of service to ensure the patient's safety. The patient's presenting symptoms, physical exam findings, and initial radiographic and laboratory data in the context of their chronic comorbidities is felt to place them at high risk for further clinical  deterioration. Furthermore, it is not anticipated that the patient will be medically stable for discharge from the hospital within 2 midnights of admission.    * I certify that at the point of admission it is my clinical judgment that the patient will require inpatient hospital care spanning beyond 2 midnights from the point of admission due to high intensity of service, high risk for further deterioration and high frequency of surveillance required.*   Subjective:  Patient seen and examined at the bedside.  She continues to have loose diarrhea.  Patient states she is woke up at least 10 times a night for diarrhea.  Vital signs are stable.  She is afebrile.  Continues to complain of abdominal pain.  Objective: Vitals:   09/20/24 1256 09/20/24 2030 09/21/24 0629 09/21/24 1243  BP: 109/70 103/61 92/60 111/61  Pulse: 77 88 85 94  Resp: 18 16 18 18   Temp: 98.2 F (36.8 C) 98.1 F (36.7 C) 98.1 F (36.7 C) 98.1 F (36.7 C)  TempSrc: Oral Oral Oral Oral  SpO2: 97% 95% 98% 96%  Weight:      Height:       No intake or output data in the 24 hours ending 09/21/24 1334  Filed Weights   09/19/24 1100  Weight: 61.6 kg    Examination:  General exam: Alert, oriented, having abdominal pain. Respiratory system: Bilateral decreased breath sounds at bases Cardiovascular system: S1 & S2 heard, Rate controlled Gastrointestinal system: Abdomen is soft,non tender to touch Extremities: No cyanosis, clubbing, edema  Central nervous system: Alert and oriented. No focal neurological deficits. Moving extremities Skin: No rashes, lesions or ulcers Psychiatry: Judgement and insight appear normal. Mood & affect appropriate.     Data Reviewed: I have personally reviewed following labs and imaging studies  CBC: Recent Labs  Lab 09/17/24 1946 09/18/24 0336  WBC 11.9* 9.3  HGB 15.3* 12.8  HCT 43.1 36.7  MCV 87.4 89.3  PLT 356 287   Basic Metabolic Panel: Recent Labs  Lab 09/17/24 1946  09/18/24 0336 09/19/24 0917 09/21/24 0823  NA 132* 132* 138 138  K 2.8* 3.2* 3.9 4.0  CL 92* 97* 103 102  CO2 26 27 27 27   GLUCOSE 100* 101* 79 82  BUN 15 11 <5* <5*  CREATININE 0.73 0.59 0.40* 0.51  CALCIUM 8.3* 7.3* 7.8* 8.3*  MG  --  2.1 2.0 1.9  PHOS  --  2.0*  --   --    GFR: Estimated Creatinine Clearance: 85 mL/min (by C-G formula based on SCr of 0.51 mg/dL). Liver Function Tests: Recent Labs  Lab 09/17/24 1946 09/18/24 0336  AST 22 17  ALT 15 11  ALKPHOS 92 85  BILITOT 0.3 <0.2  PROT 6.5 5.0*  ALBUMIN 3.1* 2.5*   Recent Labs  Lab 09/17/24 1946  LIPASE 31   No results for input(s): AMMONIA in the last 168 hours. Coagulation Profile: No results for input(s): INR, PROTIME in the last 168 hours. Cardiac Enzymes: No results for input(s): CKTOTAL, CKMB, CKMBINDEX, TROPONINI in the last 168 hours. BNP (last 3 results) No results for input(s): PROBNP in the last 8760 hours. HbA1C: No results for input(s): HGBA1C in the last 72 hours. CBG: No results for input(s): GLUCAP in the last 168 hours. Lipid Profile: No results for input(s): CHOL, HDL, LDLCALC, TRIG, CHOLHDL, LDLDIRECT in the last 72 hours. Thyroid  Function Tests: No results for input(s): TSH, T4TOTAL, FREET4, T3FREE, THYROIDAB in the last 72 hours. Anemia Panel: No results for  input(s): VITAMINB12, FOLATE, FERRITIN, TIBC, IRON , RETICCTPCT in the last 72 hours. Sepsis Labs: No results for input(s): PROCALCITON, LATICACIDVEN in the last 168 hours.  Recent Results (from the past 240 hours)  Culture, blood (routine x 2)     Status: None (Preliminary result)   Collection Time: 09/17/24 11:12 PM   Specimen: BLOOD  Result Value Ref Range Status   Specimen Description BLOOD BLOOD RIGHT ARM  Final   Special Requests   Final    BOTTLES DRAWN AEROBIC AND ANAEROBIC Blood Culture adequate volume   Culture   Final    NO GROWTH 4 DAYS Performed at [facility], [address], [place], [place] [address]    Report Status PENDING  Incomplete  Culture, blood (routine x 2)     Status: None (Preliminary result)   Collection Time: 09/17/24 11:20 PM   Specimen: BLOOD  Result Value Ref Range Status   Specimen Description BLOOD BLOOD RIGHT [place]  Final   Special Requests   Final    BOTTLES DRAWN AEROBIC AND ANAEROBIC Blood Culture adequate volume   Culture   Final    NO GROWTH 4 DAYS Performed at [facility], [address], [place], [place] [address]    Report Status PENDING  Incomplete  C Difficile Quick Screen w PCR reflex     Status: Abnormal   Collection Time: 09/18/24  9:20 AM   Specimen: STOOL  Result Value Ref Range Status   C Diff antigen POSITIVE (A) NEGATIVE Final   C Diff toxin NEGATIVE NEGATIVE Final   C Diff interpretation Results are indeterminate. See PCR results.  Final    Comment: Performed at [facility], [address] [place], [place] [address]  Gastrointestinal Panel by PCR , Stool     Status: None   Collection Time: 09/18/24  9:20 AM   Specimen: STOOL  Result Value Ref Range Status   Campylobacter species NOT DETECTED NOT DETECTED Final   Plesimonas shigelloides NOT DETECTED NOT DETECTED Final   Salmonella species NOT DETECTED NOT DETECTED Final   Yersinia enterocolitica NOT DETECTED NOT DETECTED Final   Vibrio species NOT DETECTED NOT DETECTED Final   Vibrio cholerae NOT DETECTED NOT DETECTED Final   Enteroaggregative E coli (EAEC) NOT DETECTED NOT DETECTED Final   Enteropathogenic E coli (EPEC) NOT DETECTED NOT DETECTED Final   Enterotoxigenic E coli (ETEC) NOT DETECTED NOT DETECTED Final   Shiga like toxin producing E coli (STEC) NOT DETECTED NOT DETECTED Final   Shigella/Enteroinvasive E coli (EIEC) NOT DETECTED NOT DETECTED Final   Cryptosporidium NOT DETECTED NOT DETECTED Final   Cyclospora cayetanensis NOT DETECTED NOT DETECTED Final   Entamoeba histolytica NOT DETECTED NOT DETECTED Final   Giardia  lamblia NOT DETECTED NOT DETECTED Final   Adenovirus F40/41 NOT DETECTED NOT DETECTED Final   Astrovirus NOT DETECTED NOT DETECTED Final   Norovirus GI/GII NOT DETECTED NOT DETECTED Final   Rotavirus A NOT DETECTED NOT DETECTED Final   Sapovirus (I, II, IV, and V) NOT DETECTED NOT DETECTED Final    Comment: Performed at [facility], [address] [place], [place] [address]  C. Diff by PCR, Reflexed     Status: Abnormal   Collection Time: 09/18/24  9:20 AM  Result Value Ref Range Status   Toxigenic C. Difficile by PCR POSITIVE (A) NEGATIVE Final    Comment: Positive for toxigenic C. difficile with little to no toxin production. Only treat if clinical presentation suggests symptomatic illness.  Hypervirulent Strain PRESUMPTIVE NEGATIVE PRESUMPTIVE NEGATIVE Final    Comment: Performed at [facility], [address], [place], [place] [address]         Radiology Studies: No results found.       Scheduled Meds:  enoxaparin (LOVENOX) injection  40 mg Subcutaneous Q24H   feeding supplement  237 mL Oral BID BM   fidaxomicin  200 mg Oral BID   pantoprazole   40 mg Oral Daily   sertraline  100 mg Oral Daily   Continuous Infusions:  lactated ringers  75 mL/hr at 09/21/24 0522          Derryl Duval, MD [facility] 09/21/2024, 1:34 PM

## 2024-09-22 ENCOUNTER — Other Ambulatory Visit (HOSPITAL_COMMUNITY): Payer: Self-pay

## 2024-09-22 ENCOUNTER — Telehealth (HOSPITAL_COMMUNITY): Payer: Self-pay

## 2024-09-22 ENCOUNTER — Ambulatory Visit: Admitting: Sports Medicine

## 2024-09-22 DIAGNOSIS — K529 Noninfective gastroenteritis and colitis, unspecified: Secondary | ICD-10-CM | POA: Diagnosis not present

## 2024-09-22 LAB — CULTURE, BLOOD (ROUTINE X 2)
Culture: NO GROWTH
Culture: NO GROWTH
Special Requests: ADEQUATE
Special Requests: ADEQUATE

## 2024-09-22 MED ORDER — HYDROCODONE-ACETAMINOPHEN 5-325 MG PO TABS
1.0000 | ORAL_TABLET | ORAL | Status: DC | PRN
  Administered 2024-09-22 (×2): 1 via ORAL
  Filled 2024-09-22 (×2): qty 1

## 2024-09-22 NOTE — Progress Notes (Signed)
 PROGRESS NOTE    Lindsay Mccoy  FMW:[id number] DOB: [birth year] DOA: 09/17/2024 PCP: Pcp, No   Brief Narrative:   Lindsay Mccoy is a 38 y.o. female with medical history significant of GERD, depression who presents to the emergency department due to 3-day onset of abdominal pain and diarrhea with associated chills and nausea but no vomiting or fever.  Abdominal pain was diffuse, generalized and of moderate intensity.  Diarrhea was numerous episodes daily, it was watery in nature and was without blood.  She endorsed recent completion of amoxicillin  dose regimen for dental extraction and denies any sick contact.   ED Course In the emergency department, she was tachycardic and tachypneic, but other vital signs were within normal range.  Workup in the ED showed normal CBC except for WBC of 11.9 and hemoglobin of 15.3.  BMP shows sodium 132, potassium 2.8, chloride 92, bicarb 26, blood glucose 100, BUN 15, creatinine 0.73, albumin 3.1, lipase 31, urinalysis was unimpressive for UTI.  Blood culture pending CT abdomen and pelvis with contrast showed severe circumferential colonic and rectal wall thickening with mucosal hyperemia, pericolonic inflammatory stranding, and diffuse mesenteric edema, consistent with infectious or inflammatory proctocolitis (e.g., ulcerative colitis or pseudomembranous colitis). Small pelvic ascites. No obstruction or free intraperitoneal gas to suggest perforation. Tested positive for C. difficile,  Assessment & Plan:   Principal Problem:   Colitis Active Problems:   C. difficile colitis   Abdominal pain C. difficile colitis Severe diarrhea/dehydration Dizziness secondary to above Recent antibiotic exposure 10/30: Started p.o. vancomycin 125 mg 4 times a day for 10 days and monitor response. 11/2: started Dificid due to continued diarrhea Patient reports abdominal pain but abdomen is benign on palpation. Continue IV hydration     Hypokalemia K+ 2.8 on presentation,  resolved after replacement replace as needed  Tachycardia: Likely from diarrhea.  There was concern for some drug use however U tox is negative.  Tachycardia has resolved.  Hypoalbuminemia possibly secondary to mild protein calorie malnutrition Albumin 3.1, protein supplement will be provided   Presumed cystitis CT abdomen and pelvis was suggestive of infectious/inflammatory cystitis Patient denies any irritative bladder symptoms Urinalysis was unimpressive for UTI.  Findings were likely secondary to C. difficile colitis. Will DC antibiotics, continue vancomycin for C. Difficile  Hepatic hemangioma CT abdomen and pelvis demonstrated stable left hepatic hemangioma including a dominant 5.2 x 7.0 cm lesion with characteristic peripheral nodular enhancement No follow-up recommended by radiologist   Right adrenal adenoma CT abdomen and pelvis showed stable 2.1 cm right adrenal lesion most consistent with an adenoma given his long-term stability. Adrenal  washout CT or chemical-shift MRI for characterization, or 1-year follow-up adrenal washout CT was recommended if desired by radiologist   GERD Continue Protonix    Depression Continue Zoloft    Advance Care Planning: Full code   Consults: None   Family Communication: None at bedside   Severity of Illness: The appropriate patient status for this patient is INPATIENT. Inpatient status is judged to be reasonable and necessary in order to provide the required intensity of service to ensure the patient's safety. The patient's presenting symptoms, physical exam findings, and initial radiographic and laboratory data in the context of their chronic comorbidities is felt to place them at high risk for further clinical deterioration. Furthermore, it is not anticipated that the patient will be medically stable for discharge from the hospital within 2 midnights of admission.    * I certify that at the point of admission it is  my clinical judgment  that the patient will require inpatient hospital care spanning beyond 2 midnights from the point of admission due to high intensity of service, high risk for further deterioration and high frequency of surveillance required.*   Subjective:  Patient seen and examined at the bedside.  She continues to report of multiple loose stools.  Abdominal pain persists.  Patient does not feel comfortable going home today.  Will continue to monitor stool frequency today.  Continue Dificid.  Diet as tolerated.  Potential discharge tomorrow   objective: Vitals:   09/21/24 2059 09/22/24 0045 09/22/24 0442 09/22/24 1219  BP: 120/67 117/79 119/71 114/73  Pulse: 80 80 75 84  Resp: 20 19 18 16   Temp: 98 F (36.7 C) 98.3 F (36.8 C) 98.2 F (36.8 C) 98.3 F (36.8 C)  TempSrc: Oral Oral Oral Oral  SpO2: 96% 96% 96% 98%  Weight:      Height:        Intake/Output Summary (Last 24 hours) at 09/22/2024 1320 Last data filed at 09/21/2024 2059 Gross per 24 hour  Intake 240 ml  Output --  Net 240 ml    Filed Weights   09/19/24 1100  Weight: 61.6 kg    Examination:  General exam: Alert, oriented, having abdominal pain. Respiratory system: Bilateral decreased breath sounds at bases Cardiovascular system: S1 & S2 heard, Rate controlled Gastrointestinal system: Abdomen is soft,non tender to touch Extremities: No cyanosis, clubbing, edema  Central nervous system: Alert and oriented. No focal neurological deficits. Moving extremities Skin: No rashes, lesions or ulcers Psychiatry: Judgement and insight appear normal. Mood & affect appropriate.     Data Reviewed: I have personally reviewed following labs and imaging studies  CBC: Recent Labs  Lab 09/17/24 1946 09/18/24 0336  WBC 11.9* 9.3  HGB 15.3* 12.8  HCT 43.1 36.7  MCV 87.4 89.3  PLT 356 287   Basic Metabolic Panel: Recent Labs  Lab 09/17/24 1946 09/18/24 0336 09/19/24 0917 09/21/24 0823  NA 132* 132* 138 138  K 2.8* 3.2* 3.9 4.0   CL 92* 97* 103 102  CO2 26 27 27 27   GLUCOSE 100* 101* 79 82  BUN 15 11 <5* <5*  CREATININE 0.73 0.59 0.40* 0.51  CALCIUM 8.3* 7.3* 7.8* 8.3*  MG  --  2.1 2.0 1.9  PHOS  --  2.0*  --   --    GFR: Estimated Creatinine Clearance: 85 mL/min (by C-G formula based on SCr of 0.51 mg/dL). Liver Function Tests: Recent Labs  Lab 09/17/24 1946 09/18/24 0336  AST 22 17  ALT 15 11  ALKPHOS 92 85  BILITOT 0.3 <0.2  PROT 6.5 5.0*  ALBUMIN 3.1* 2.5*   Recent Labs  Lab 09/17/24 1946  LIPASE 31   No results for input(s): AMMONIA in the last 168 hours. Coagulation Profile: No results for input(s): INR, PROTIME in the last 168 hours. Cardiac Enzymes: No results for input(s): CKTOTAL, CKMB, CKMBINDEX, TROPONINI in the last 168 hours. BNP (last 3 results) No results for input(s): PROBNP in the last 8760 hours. HbA1C: No results for input(s): HGBA1C in the last 72 hours. CBG: No results for input(s): GLUCAP in the last 168 hours. Lipid Profile: No results for input(s): CHOL, HDL, LDLCALC, TRIG, CHOLHDL, LDLDIRECT in the last 72 hours. Thyroid  Function Tests: No results for input(s): TSH, T4TOTAL, FREET4, T3FREE, THYROIDAB in the last 72 hours. Anemia Panel: No results for input(s): VITAMINB12, FOLATE, FERRITIN, TIBC, IRON , RETICCTPCT in the last 72 hours.  Sepsis Labs: No results for input(s): PROCALCITON, LATICACIDVEN in the last 168 hours.  Recent Results (from the past 240 hours)  Culture, blood (routine x 2)     Status: None   Collection Time: 09/17/24 11:12 PM   Specimen: BLOOD  Result Value Ref Range Status   Specimen Description BLOOD BLOOD RIGHT ARM  Final   Special Requests   Final    BOTTLES DRAWN AEROBIC AND ANAEROBIC Blood Culture adequate volume   Culture   Final    NO GROWTH 5 DAYS Performed at [facility], [address], [place], [place] [address]    Report Status 09/22/2024 FINAL  Final  Culture, blood  (routine x 2)     Status: None   Collection Time: 09/17/24 11:20 PM   Specimen: BLOOD  Result Value Ref Range Status   Specimen Description BLOOD BLOOD RIGHT [place]  Final   Special Requests   Final    BOTTLES DRAWN AEROBIC AND ANAEROBIC Blood Culture adequate volume   Culture   Final    NO GROWTH 5 DAYS Performed at [facility], [address], [place], [place] [address]    Report Status 09/22/2024 FINAL  Final  C Difficile Quick Screen w PCR reflex     Status: Abnormal   Collection Time: 09/18/24  9:20 AM   Specimen: STOOL  Result Value Ref Range Status   C Diff antigen POSITIVE (A) NEGATIVE Final   C Diff toxin NEGATIVE NEGATIVE Final   C Diff interpretation Results are indeterminate. See PCR results.  Final    Comment: Performed at [facility], [address] [place], [place] [address]  Gastrointestinal Panel by PCR , Stool     Status: None   Collection Time: 09/18/24  9:20 AM   Specimen: STOOL  Result Value Ref Range Status   Campylobacter species NOT DETECTED NOT DETECTED Final   Plesimonas shigelloides NOT DETECTED NOT DETECTED Final   Salmonella species NOT DETECTED NOT DETECTED Final   Yersinia enterocolitica NOT DETECTED NOT DETECTED Final   Vibrio species NOT DETECTED NOT DETECTED Final   Vibrio cholerae NOT DETECTED NOT DETECTED Final   Enteroaggregative E coli (EAEC) NOT DETECTED NOT DETECTED Final   Enteropathogenic E coli (EPEC) NOT DETECTED NOT DETECTED Final   Enterotoxigenic E coli (ETEC) NOT DETECTED NOT DETECTED Final   Shiga like toxin producing E coli (STEC) NOT DETECTED NOT DETECTED Final   Shigella/Enteroinvasive E coli (EIEC) NOT DETECTED NOT DETECTED Final   Cryptosporidium NOT DETECTED NOT DETECTED Final   Cyclospora cayetanensis NOT DETECTED NOT DETECTED Final   Entamoeba histolytica NOT DETECTED NOT DETECTED Final   Giardia lamblia NOT DETECTED NOT DETECTED Final   Adenovirus F40/41 NOT DETECTED NOT DETECTED Final   Astrovirus NOT DETECTED NOT  DETECTED Final   Norovirus GI/GII NOT DETECTED NOT DETECTED Final   Rotavirus A NOT DETECTED NOT DETECTED Final   Sapovirus (I, II, IV, and V) NOT DETECTED NOT DETECTED Final    Comment: Performed at [facility], [address] [place], [place] [address]  C. Diff by PCR, Reflexed     Status: Abnormal   Collection Time: 09/18/24  9:20 AM  Result Value Ref Range Status   Toxigenic C. Difficile by PCR POSITIVE (A) NEGATIVE Final    Comment: Positive for toxigenic C. difficile with little to no toxin production. Only treat if clinical presentation suggests symptomatic illness.   Hypervirulent Strain PRESUMPTIVE NEGATIVE PRESUMPTIVE NEGATIVE Final    Comment: Performed at [facility]  [facility], [address] [place], [place] [address]         Radiology Studies: No results found.       Scheduled Meds:  enoxaparin (LOVENOX) injection  40 mg Subcutaneous Q24H   feeding supplement  237 mL Oral BID BM   fidaxomicin  200 mg Oral BID   pantoprazole   40 mg Oral Daily   sertraline  100 mg Oral Daily   Continuous Infusions:  lactated ringers  Stopped (09/22/24 1035)          Derryl Duval, MD [facility] 09/22/2024, 1:20 PM

## 2024-09-22 NOTE — Plan of Care (Signed)

## 2024-09-22 NOTE — Telephone Encounter (Signed)
 Pharmacy Patient Advocate Encounter  Received notification from [employer] that Prior Authorization for Dificid 200mg  has been APPROVED from 09/22/24 to 09/22/25. Ran test claim, Copay is $4.00. This test claim was processed through [facility]- copay amounts may vary at other pharmacies due to pharmacy/plan contracts, or as the patient moves through the different stages of their insurance plan.   PA #/Case ID/Reference #: [id number]

## 2024-09-22 NOTE — Plan of Care (Signed)

## 2024-09-23 DIAGNOSIS — E876 Hypokalemia: Secondary | ICD-10-CM | POA: Diagnosis present

## 2024-09-23 DIAGNOSIS — K529 Noninfective gastroenteritis and colitis, unspecified: Secondary | ICD-10-CM | POA: Diagnosis not present

## 2024-09-23 MED ORDER — FIDAXOMICIN 200 MG PO TABS: 200.0000 mg | ORAL_TABLET | Freq: Two times a day (BID) | ORAL | 0 refills | Status: AC

## 2024-09-23 MED ORDER — FIDAXOMICIN 200 MG PO TABS: 200.0000 mg | ORAL_TABLET | Freq: Two times a day (BID) | ORAL | 0 refills | Status: DC

## 2024-09-23 NOTE — Discharge Summary (Signed)
 Physician Discharge Summary  Lindsay Mccoy FMW:[id number] DOB: 1986-03-02 DOA: 09/17/2024  PCP: Pcp, No  Admit date: 09/17/2024 Discharge date: 09/23/2024  Admitted From: Home Disposition: Home  Recommendations for Outpatient Follow-up:  Follow up with PCP in 1 week Follow up in ED if symptoms worsen or new appear Right adrenal adenoma- CT abdomen and pelvis showed stable 2.1 cm right adrenal lesion most consistent with an adenoma given his long-term stability. Adrenal  washout CT or chemical-shift MRI for characterization, or 1-year follow-up adrenal washout CT was recommended if desired   Home Health: No Equipment/Devices: None  Discharge Condition: Stable CODE STATUS: Full Diet recommendation: Heart healthy  Brief/Interim Summary:  Lindsay Mccoy is a 38 y.o. female with medical history significant of GERD, depression who presents to the emergency department due to 3-day onset of abdominal pain and diarrhea with associated chills and nausea but no vomiting or fever.  Abdominal pain was diffuse, generalized and of moderate intensity.  Diarrhea was numerous episodes daily, it was watery in nature and was without blood.  She endorsed recent completion of amoxicillin  dose regimen for dental extraction and denies any sick contact.   ED Course In the emergency department, she was tachycardic and tachypneic, she was dehydrated, sodium 132, potassium 2.8.  CT abdomen and pelvis with contrast showed severe circumferential colonic and rectal wall thickening with mucosal hyperemia, pericolonic inflammatory stranding, and diffuse mesenteric edema, consistent with infectious or inflammatory proctocolitis (e.g., ulcerative colitis or pseudomembranous colitis). Small pelvic ascites. No obstruction or free intraperitoneal gas to suggest perforation. She was eventually tested positive for C. difficile. She was initiated on oral vancomycin however symptoms did not improve as expected and therefore antibiotics  switched to Dificid on 09/21/2024.  She is planned for 10 days of therapy. Discharged home in stable condition.    Other issues  Hypokalemia K+ 2.8 on presentation, resolved after replacement replace as needed   Hypoalbuminemia with albumin 3.1 noted   Hepatic hemangioma CT abdomen and pelvis demonstrated stable left hepatic hemangioma including a dominant 5.2 x 7.0 cm lesion with characteristic peripheral nodular enhancement No follow-up recommended by radiologist   Right adrenal adenoma CT abdomen and pelvis showed stable 2.1 cm right adrenal lesion most consistent with an adenoma given his long-term stability. Adrenal  washout CT or chemical-shift MRI for characterization, or 1-year follow-up adrenal washout CT was recommended if desired by radiologist   GERD Continue Protonix    Depression Continue Zoloft    Discharge Diagnoses:  Principal Problem:   Colitis Active Problems:   GERD without esophagitis   Depression, recurrent   C. difficile colitis   Hypokalemia    Discharge Instructions  Discharge Instructions     Call MD for:  persistant dizziness or light-headedness   Complete by: As directed    Call MD for:  persistant nausea and vomiting   Complete by: As directed    Call MD for:  severe uncontrolled pain   Complete by: As directed    Diet - low sodium heart healthy   Complete by: As directed    Discharge instructions   Complete by: As directed    1.  Complete remaining 8 days of Dificid to complete 10 days of therapy.   Increase activity slowly   Complete by: As directed       Allergies as of 09/23/2024   No Known Allergies      Medication List     STOP taking these medications    amoxicillin  500 MG capsule  Commonly known as: AMOXIL        TAKE these medications    cyclobenzaprine  10 MG tablet Commonly known as: FLEXERIL  Take 10 mg by mouth 3 (three) times daily.   estradiol  0.1 MG/24HR patch Commonly known as: Vivelle -Dot Place 1  patch (0.1 mg total) onto the skin 2 (two) times a week.   fexofenadine 180 MG tablet Commonly known as: ALLEGRA Take 180 mg by mouth daily.   fidaxomicin 200 MG Tabs tablet Commonly known as: DIFICID Take 1 tablet (200 mg total) by mouth 2 (two) times daily for 8 days.   fluticasone  50 MCG/ACT nasal spray Commonly known as: FLONASE  Place 2 sprays into both nostrils daily.   hydrOXYzine 50 MG tablet Commonly known as: ATARAX Take 50 mg by mouth 3 (three) times daily.   ibuprofen 800 MG tablet Commonly known as: ADVIL Take 800 mg by mouth every 8 (eight) hours as needed for mild pain (pain score 1-3).   lisdexamfetamine 30 MG capsule Commonly known as: VYVANSE Take 30 mg by mouth daily.   metroNIDAZOLE  0.75 % vaginal gel Commonly known as: METROGEL  Place 1 Applicatorful vaginally at bedtime. What changed:  when to take this additional instructions   pantoprazole  40 MG tablet Commonly known as: PROTONIX  TAKE 1 TABLET BY MOUTH DAILY   pregabalin 50 MG capsule Commonly known as: LYRICA Take 50 mg by mouth daily.   sertraline 100 MG tablet Commonly known as: ZOLOFT Take 100 mg by mouth daily.        Follow-up Information     Rakes, Rock HERO, FNP. Schedule an appointment as soon as possible for a visit in 1 week(s).   Specialty: Family Medicine Contact information: [address] [place] [address] [phone]                No Known Allergies  Consultations:    Procedures/Studies: CT ABDOMEN PELVIS W CONTRAST Result Date: 09/17/2024 EXAM: CT ABDOMEN AND PELVIS WITH CONTRAST 09/17/2024 10:01:26 PM TECHNIQUE: CT of the abdomen and pelvis was performed with the administration of 100 mL of iohexol  (OMNIPAQUE ) 300 MG/ML solution. Multiplanar reformatted images are provided for review. Automated exposure control, iterative reconstruction, and/or weight-based adjustment of the mA/kV was utilized to reduce the radiation dose to as low as reasonably  achievable. COMPARISON: Prior examinations of 02/02/2021 and 08/21/2019. CLINICAL HISTORY: Abdominal pain, acute, nonlocalized. Pt reports diarrhea since Sunday night. Also c/o abd pain, weakness. Pt reports nausea but no vomiting. FINDINGS: LOWER CHEST: No acute abnormality. LIVER: A stable 5.2 x 7.0 cm solid mass is seen within the lateral segment of the left hepatic lobe, better characterized on prior examinations of 02/02/2021 and 08/21/2019 where this was seen to demonstrate peripheral nodular enhancement and is compatible with a benign cavernous hemangioma. A smaller adjacent similar lesion is also stable measuring 1 cm within segment 4. Liver otherwise unremarkable. GALLBLADDER AND BILE DUCTS: Gallbladder is unremarkable. No biliary ductal dilatation. SPLEEN: No acute abnormality. PANCREAS: No acute abnormality. 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 No follow up imaging is recommended for this lesion. Left adrenal gland is unremarkable. KIDNEYS, URETERS AND BLADDER: No stones in the kidneys or ureters. No hydronephrosis. No perinephric or periureteral stranding. There is circumferential bladder wall thickening and perivesicular inflammatory stranding in  keeping with an infectious or inflammatory cystitis. The bladder is not distended. GI AND BOWEL: Stomach and small bowel are unremarkable. There is severe circumferential bowel wall thickening, mucosal hyperemia, periluminal inflammatory stranding and widespread mesenteric edema involving the entire colon and rectum in keeping with an infectious or inflammatory proctocolitis such as ulcerative colitis or pseudomembranous colitis. No evidence of obstruction. Appendix normal. PERITONEUM AND RETROPERITONEUM: Small  free intraperitoneal fluid is seen within the pelvis. No free intraperitoneal gas. VASCULATURE: Aorta is normal in caliber. LYMPH NODES: No lymphadenopathy. REPRODUCTIVE ORGANS: Status post hysterectomy. No adnexal masses are seen. BONES AND SOFT TISSUES: No acute osseous abnormality. No focal soft tissue abnormality. IMPRESSION: 1. Severe circumferential colonic and rectal wall thickening with mucosal hyperemia, pericolonic inflammatory stranding, and diffuse mesenteric edema, consistent with infectious or inflammatory proctocolitis (e.g., ulcerative colitis or pseudomembranous colitis). Small pelvic ascites. No obstruction or free intraperitoneal gas to suggest perforation . 2. Circumferential bladder wall thickening with perivesical inflammatory stranding, consistent with infectious or inflammatory cystitis. Bladder underdistention may contribute. correlation with urinalysis and urine culture may be helpful for further management. 3. Stable left hepatic hemangiomas, including a dominant 5.2 x 7.0 cm lesion with characteristic peripheral nodular enhancement; no follow-up recommended. 4. Stable 2.1 cm right adrenal lesion measuring 20 HU on noncontrast CT, most consistent with an adenoma given its long-term stability. If desired, this could be confirmed with adrenal washout CT or chemical-shift MRI for characterization, or 1-year follow-up adrenal washout CT. Electronically signed by: Dorethia Molt MD 09/17/2024 10:14 PM EDT RP Workstation: [id number]      Subjective:   Discharge Exam: Vitals:   09/22/24 2007 09/23/24 0313  BP: 105/66 110/67  Pulse: 82 84  Resp: 18 16  Temp: 97.7 F (36.5 C) 97.7 F (36.5 C)  SpO2: 97% 97%    General: Pt is alert, awake, not in acute distress Cardiovascular: rate controlled, S1/S2 + Respiratory: bilateral decreased breath sounds at bases Abdominal: Soft, NT, ND, bowel sounds + Extremities: no edema, no cyanosis    The results of significant diagnostics  from this hospitalization (including imaging, microbiology, ancillary and laboratory) are listed below for reference.     Microbiology: Recent Results (from the past 240 hours)  Culture, blood (routine x 2)     Status: None   Collection Time: 09/17/24 11:12 PM   Specimen: BLOOD  Result Value Ref Range Status   Specimen Description BLOOD BLOOD RIGHT ARM  Final   Special Requests   Final    BOTTLES DRAWN AEROBIC AND ANAEROBIC Blood Culture adequate volume   Culture   Final    NO GROWTH 5 DAYS Performed at [facility], [address], [place], [place] [address]    Report Status 09/22/2024 FINAL  Final  Culture, blood (routine x 2)     Status: None   Collection Time: 09/17/24 11:20 PM   Specimen: BLOOD  Result Value Ref Range Status   Specimen Description BLOOD BLOOD RIGHT [place]  Final   Special Requests   Final    BOTTLES DRAWN AEROBIC AND ANAEROBIC Blood Culture adequate volume   Culture   Final    NO GROWTH 5 DAYS Performed at [facility], [address], [place], [place] [address]    Report Status 09/22/2024 FINAL  Final  C Difficile Quick Screen w PCR reflex     Status: Abnormal   Collection Time: 09/18/24  9:20 AM   Specimen: STOOL  Result Value Ref Range Status   C Diff antigen POSITIVE (  A) NEGATIVE Final   C Diff toxin NEGATIVE NEGATIVE Final   C Diff interpretation Results are indeterminate. See PCR results.  Final    Comment: Performed at [facility], [address] [place], [place] [address]  Gastrointestinal Panel by PCR , Stool     Status: None   Collection Time: 09/18/24  9:20 AM   Specimen: STOOL  Result Value Ref Range Status   Campylobacter species NOT DETECTED NOT DETECTED Final   Plesimonas shigelloides NOT DETECTED NOT DETECTED Final   Salmonella species NOT DETECTED NOT DETECTED Final   Yersinia enterocolitica NOT DETECTED NOT DETECTED Final   Vibrio species NOT DETECTED NOT DETECTED Final   Vibrio cholerae NOT DETECTED NOT DETECTED Final    Enteroaggregative E coli (EAEC) NOT DETECTED NOT DETECTED Final   Enteropathogenic E coli (EPEC) NOT DETECTED NOT DETECTED Final   Enterotoxigenic E coli (ETEC) NOT DETECTED NOT DETECTED Final   Shiga like toxin producing E coli (STEC) NOT DETECTED NOT DETECTED Final   Shigella/Enteroinvasive E coli (EIEC) NOT DETECTED NOT DETECTED Final   Cryptosporidium NOT DETECTED NOT DETECTED Final   Cyclospora cayetanensis NOT DETECTED NOT DETECTED Final   Entamoeba histolytica NOT DETECTED NOT DETECTED Final   Giardia lamblia NOT DETECTED NOT DETECTED Final   Adenovirus F40/41 NOT DETECTED NOT DETECTED Final   Astrovirus NOT DETECTED NOT DETECTED Final   Norovirus GI/GII NOT DETECTED NOT DETECTED Final   Rotavirus A NOT DETECTED NOT DETECTED Final   Sapovirus (I, II, IV, and V) NOT DETECTED NOT DETECTED Final    Comment: Performed at [facility], [address] [place], [place] [address]  C. Diff by PCR, Reflexed     Status: Abnormal   Collection Time: 09/18/24  9:20 AM  Result Value Ref Range Status   Toxigenic C. Difficile by PCR POSITIVE (A) NEGATIVE Final    Comment: Positive for toxigenic C. difficile with little to no toxin production. Only treat if clinical presentation suggests symptomatic illness.   Hypervirulent Strain PRESUMPTIVE NEGATIVE PRESUMPTIVE NEGATIVE Final    Comment: Performed at [facility], [address], [place], [place] [address]     Labs: BNP (last 3 results) No results for input(s): BNP in the last 8760 hours. Basic Metabolic Panel: Recent Labs  Lab 09/17/24 1946 09/18/24 0336 09/19/24 0917 09/21/24 0823  NA 132* 132* 138 138  K 2.8* 3.2* 3.9 4.0  CL 92* 97* 103 102  CO2 26 27 27 27   GLUCOSE 100* 101* 79 82  BUN 15 11 <5* <5*  CREATININE 0.73 0.59 0.40* 0.51  CALCIUM 8.3* 7.3* 7.8* 8.3*  MG  --  2.1 2.0 1.9  PHOS  --  2.0*  --   --    Liver Function Tests: Recent Labs  Lab 09/17/24 1946 09/18/24 0336  AST 22 17  ALT 15 11   ALKPHOS 92 85  BILITOT 0.3 <0.2  PROT 6.5 5.0*  ALBUMIN 3.1* 2.5*   Recent Labs  Lab 09/17/24 1946  LIPASE 31   No results for input(s): AMMONIA in the last 168 hours. CBC: Recent Labs  Lab 09/17/24 1946 09/18/24 0336  WBC 11.9* 9.3  HGB 15.3* 12.8  HCT 43.1 36.7  MCV 87.4 89.3  PLT 356 287   Cardiac Enzymes: No results for input(s): CKTOTAL, CKMB, CKMBINDEX, TROPONINI in the last 168 hours. BNP: Invalid input(s): POCBNP CBG: No results for input(s): GLUCAP in the last 168 hours. D-Dimer No results for input(s): DDIMER in the  last 72 hours. Hgb A1c No results for input(s): HGBA1C in the last 72 hours. Lipid Profile No results for input(s): CHOL, HDL, LDLCALC, TRIG, CHOLHDL, LDLDIRECT in the last 72 hours. Thyroid  function studies No results for input(s): TSH, T4TOTAL, T3FREE, THYROIDAB in the last 72 hours.  Invalid input(s): FREET3 Anemia work up No results for input(s): VITAMINB12, FOLATE, FERRITIN, TIBC, IRON , RETICCTPCT in the last 72 hours. Urinalysis    Component Value Date/Time   COLORURINE YELLOW 09/17/2024 2136   APPEARANCEUR HAZY (A) 09/17/2024 2136   LABSPEC 1.026 09/17/2024 2136   PHURINE 6.0 09/17/2024 2136   GLUCOSEU NEGATIVE 09/17/2024 2136   HGBUR MODERATE (A) 09/17/2024 2136   BILIRUBINUR NEGATIVE 09/17/2024 2136   KETONESUR 20 (A) 09/17/2024 2136   PROTEINUR 100 (A) 09/17/2024 2136   NITRITE NEGATIVE 09/17/2024 2136   LEUKOCYTESUR NEGATIVE 09/17/2024 2136   Sepsis Labs Recent Labs  Lab 09/17/24 1946 09/18/24 0336  WBC 11.9* 9.3   Microbiology Recent Results (from the past 240 hours)  Culture, blood (routine x 2)     Status: None   Collection Time: 09/17/24 11:12 PM   Specimen: BLOOD  Result Value Ref Range Status   Specimen Description BLOOD BLOOD RIGHT ARM  Final   Special Requests   Final    BOTTLES DRAWN AEROBIC AND ANAEROBIC Blood Culture adequate volume   Culture   Final     NO GROWTH 5 DAYS Performed at [facility], [address] [place], [place] [address]    Report Status 09/22/2024 FINAL  Final  Culture, blood (routine x 2)     Status: None   Collection Time: 09/17/24 11:20 PM   Specimen: BLOOD  Result Value Ref Range Status   Specimen Description BLOOD BLOOD RIGHT [place]  Final   Special Requests   Final    BOTTLES DRAWN AEROBIC AND ANAEROBIC Blood Culture adequate volume   Culture   Final    NO GROWTH 5 DAYS Performed at [facility], [address] [place], [place] [address]    Report Status 09/22/2024 FINAL  Final  C Difficile Quick Screen w PCR reflex     Status: Abnormal   Collection Time: 09/18/24  9:20 AM   Specimen: STOOL  Result Value Ref Range Status   C Diff antigen POSITIVE (A) NEGATIVE Final   C Diff toxin NEGATIVE NEGATIVE Final   C Diff interpretation Results are indeterminate. See PCR results.  Final    Comment: Performed at [facility], [address] [place], [place] [address]  Gastrointestinal Panel by PCR , Stool     Status: None   Collection Time: 09/18/24  9:20 AM   Specimen: STOOL  Result Value Ref Range Status   Campylobacter species NOT DETECTED NOT DETECTED Final   Plesimonas shigelloides NOT DETECTED NOT DETECTED Final   Salmonella species NOT DETECTED NOT DETECTED Final   Yersinia enterocolitica NOT DETECTED NOT DETECTED Final   Vibrio species NOT DETECTED NOT DETECTED Final   Vibrio cholerae NOT DETECTED NOT DETECTED Final   Enteroaggregative E coli (EAEC) NOT DETECTED NOT DETECTED Final   Enteropathogenic E coli (EPEC) NOT DETECTED NOT DETECTED Final   Enterotoxigenic E coli (ETEC) NOT DETECTED NOT DETECTED Final   Shiga like toxin producing E coli (STEC) NOT DETECTED NOT DETECTED Final   Shigella/Enteroinvasive E coli (EIEC) NOT DETECTED NOT DETECTED Final   Cryptosporidium NOT DETECTED NOT DETECTED Final   Cyclospora cayetanensis NOT DETECTED NOT DETECTED Final   Entamoeba histolytica NOT DETECTED NOT  DETECTED Final   Giardia lamblia NOT DETECTED NOT DETECTED Final   Adenovirus F40/41 NOT DETECTED NOT DETECTED Final   Astrovirus NOT DETECTED NOT DETECTED Final   Norovirus GI/GII NOT DETECTED NOT DETECTED Final   Rotavirus A NOT DETECTED NOT DETECTED Final   Sapovirus (I, II, IV, and V) NOT DETECTED NOT DETECTED Final    Comment: Performed at [facility], [address] [place], [place] [address]  C. Diff by PCR, Reflexed     Status: Abnormal   Collection Time: 09/18/24  9:20 AM  Result Value Ref Range Status   Toxigenic C. Difficile by PCR POSITIVE (A) NEGATIVE Final    Comment: Positive for toxigenic C. difficile with little to no toxin production. Only treat if clinical presentation suggests symptomatic illness.   Hypervirulent Strain PRESUMPTIVE NEGATIVE PRESUMPTIVE NEGATIVE Final    Comment: Performed at [facility], [address] [place], [place] [address]     Time coordinating discharge: 35 minutes  SIGNED:   Derryl Duval, MD  [facility] 09/23/2024, 4:29 PM

## 2024-09-23 NOTE — Plan of Care (Signed)

## 2024-09-29 ENCOUNTER — Encounter: Admitting: Sports Medicine

## 2024-10-03 ENCOUNTER — Encounter: Admitting: Sports Medicine

## 2024-10-07 ENCOUNTER — Telehealth: Admitting: Family Medicine

## 2024-10-07 ENCOUNTER — Telehealth: Payer: Self-pay

## 2024-10-07 ENCOUNTER — Ambulatory Visit: Payer: Self-pay

## 2024-10-07 DIAGNOSIS — B370 Candidal stomatitis: Secondary | ICD-10-CM | POA: Diagnosis not present

## 2024-10-07 MED ORDER — NYSTATIN 100000 UNIT/ML MT SUSP: 5.0000 mL | Freq: Four times a day (QID) | OROMUCOSAL | 0 refills | Status: DC

## 2024-10-07 NOTE — Telephone Encounter (Signed)
 FYI Only or Action Required?: FYI only for provider: appointment scheduled on 10/07/2024 vitual visit.  Patient was last seen in primary care on 01/31/2024 by Severa Rock HERO, FNP.  Called Nurse Triage reporting Mouth Lesions.  Symptoms began several days ago.  Interventions attempted: Nothing.  Symptoms are: gradually worsening.  Triage Disposition: See PCP When Office is Open (Within 3 Days)  Patient/caregiver understands and will follow disposition?: Yes   Reason for Disposition  [1] White patches that stick to tongue or inner cheek AND [2] can be wiped off    Can not be wiped off  Answer Assessment - Initial Assessment Questions 1. SYMPTOM: What's the main symptom you're concerned about? (e.g., chapped lips, dry mouth, lump, sores)     White patches on tongue  2. ONSET: When did the  white patches  start?     Thursday  3. PAIN: Is there any pain? If Yes, ask: How bad is it? (Scale: 0-10; or none, mild, moderate, severe)     6, losenges help  4. CAUSE: What do you think is causing the symptoms?     Took antibiotics  5. OTHER SYMPTOMS: Do you have any other symptoms? (e.g., fever, sore throat, toothache, swelling)    No  Protocols used: Mouth Symptoms-A-AH

## 2024-10-07 NOTE — Telephone Encounter (Signed)
 Reason for CRM: Patient would like to have a TOC appointment with Dr. Sherlynn. Patient has had 4 appointments canceled in the past with this provider, so patient is questioning if this PCP will be open to taking her on. Please call patient if this is an option.   Sent to provider for approval for rescheduling of new patient appt 11/18 dnh  See prior appt notes for previous visits cancelled by patient  11/3, 11/10, 11/14(9am), 11/14 (11am)

## 2024-10-07 NOTE — Telephone Encounter (Signed)
 Called pt but unable to leave VM. Sent pt a wellsite geologist.

## 2024-10-07 NOTE — Telephone Encounter (Signed)
 Please advise if you are ok with taking this pt on she cancelled 4 times. Our office policy is if a new pt does not show or cancel within 24 hours they can not schedule with the same Dr unless the Dr is ok with seeing the patient.
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
## 2024-10-07 NOTE — Patient Instructions (Addendum)
 Lindsay Mccoy, thank you for joining Lindsay CHRISTELLA Barefoot, NP for today's virtual visit.  While this provider is not your primary care provider (PCP), if your PCP is located in our provider database this encounter information will be shared with them immediately following your visit.   A [facility] MyChart account gives you access to today's visit and all your visits, tests, and labs performed at [facility]  click here if you don't have a [facility] MyChart account or go to [email or web address]  Consent: (Patient) Lindsay Mccoy provided verbal consent for this virtual visit at the beginning of the encounter.  Current Medications:  Current Outpatient Medications:    nystatin  (MYCOSTATIN ) 100000 UNIT/ML suspension, Take 5 mLs (500,000 Units total) by mouth 4 (four) times daily for 14 days., Disp: 80 mL, Rfl: 0   cyclobenzaprine  (FLEXERIL ) 10 MG tablet, Take 10 mg by mouth 3 (three) times daily., Disp: , Rfl:    estradiol  (VIVELLE -DOT) 0.1 MG/24HR patch, Place 1 patch (0.1 mg total) onto the skin 2 (two) times a week., Disp: 8 patch, Rfl: 12   fexofenadine (ALLEGRA) 180 MG tablet, Take 180 mg by mouth daily., Disp: , Rfl:    fluticasone  (FLONASE ) 50 MCG/ACT nasal spray, Place 2 sprays into both nostrils daily., Disp: 16 g, Rfl: 6   hydrOXYzine (ATARAX) 50 MG tablet, Take 50 mg by mouth 3 (three) times daily., Disp: , Rfl:    ibuprofen (ADVIL) 800 MG tablet, Take 800 mg by mouth every 8 (eight) hours as needed for mild pain (pain score 1-3)., Disp: , Rfl:    lisdexamfetamine (VYVANSE) 30 MG capsule, Take 30 mg by mouth daily. (Patient not taking: Reported on 09/18/2024), Disp: , Rfl:    metroNIDAZOLE  (METROGEL ) 0.75 % vaginal gel, Place 1 Applicatorful vaginally at bedtime. (Patient taking differently: Place 1 Applicatorful vaginally See admin instructions. Apply 1 applicator full vaginally at bedtime 1-2 times weekly, at least 1-2 times a month.), Disp: 70 g, Rfl: 5   pantoprazole  (PROTONIX )  40 MG tablet, TAKE 1 TABLET BY MOUTH DAILY, Disp: 90 tablet, Rfl: 0   pregabalin (LYRICA) 50 MG capsule, Take 50 mg by mouth daily., Disp: , Rfl:    sertraline (ZOLOFT) 100 MG tablet, Take 100 mg by mouth daily., Disp: , Rfl:    Medications ordered in this encounter:  Meds ordered this encounter  Medications   nystatin  (MYCOSTATIN ) 100000 UNIT/ML suspension    Sig: Take 5 mLs (500,000 Units total) by mouth 4 (four) times daily for 14 days.    Dispense:  80 mL    Refill:  0    Supervising Provider:   BLAISE ALEENE KIDD [[id number]]     *If you need refills on other medications prior to your next appointment, please contact your pharmacy*  Follow-Up: Call back or seek an in-person evaluation if the symptoms worsen or if the condition fails to improve as anticipated.  [facility] [phone]  Other Instructions  Oral Thrush, Adult Oral thrush is an infection in your mouth and throat and on your tongue. It causes white patches to form in your mouth and on your tongue. Many cases of thrush are mild. But, sometimes, thrush can be serious. People who have a weak body defense system (immune system) or other diseases can be affected more. What are the causes? This condition is caused by a type of fungus called yeast. The fungus is normally present in small amounts in the mouth and nose. If a person has a  long-term illness or a weak body defense system, the fungus can grow and spread quickly. This causes thrush. What increases the risk? You are more likely to develop this condition if: You have a weak body defense system. You are an older adult. You have diabetes, cancer, or HIV. You have a dry mouth. You are pregnant or breastfeeding. You do not take good care of your teeth. This risk is greater for people who have false teeth (dentures). You use antibiotic or steroid medicines. What are the signs or symptoms? Symptoms of this condition include: A burning feeling in the  mouth and throat. White patches that stick to the mouth and tongue. A bad taste in the mouth or trouble tasting foods. A feeling like you have cotton in your mouth. Pain when you eat and swallow. Not wanting to eat as much as usual. Cracking at the corners of the mouth. How is this treated? This condition is treated with medicines called antifungals. These medicines prevent a fungus from growing. The medicines are either put right on the area (topical) or swallowed (oral). Your doctor will also treat other problems that you may have, such as diabetes or HIV. Follow these instructions at home: Helping with pain and soreness To lessen your pain: Drink cold liquids, like water  and iced tea. Eat frozen ice pops or frozen juices. Eat foods that are easy to swallow, like gelatin and ice cream. Drink from a straw if you have too much pain in your mouth.  General instructions Take or use over-the-counter and prescription medicines only as told by your doctor. Eat plain yogurt that has live cultures in it. Read the label to make sure that there are live cultures in your yogurt. If you wear false teeth: Take them out before you go to bed. Brush them well. Soak them in a cleaner. Rinse your mouth with warm salt-water  many times a day. To make the salt-water  mixture, dissolve -1 teaspoon (3-6 g) of salt in 1 cup (237 mL) of warm water . Contact a doctor if: Your problems do not get better within 7 days of treatment. Your infection is spreading. This may show as white areas on the skin outside of your mouth. You are nursing your baby and you have redness and pain in the nipples. Summary Oral thrush is an infection in your mouth and throat. It is caused by a fungus. You are more likely to get this condition if you have a weak body defense system. Diseases like diabetes, cancer, or HIV also add to your risk. This condition is treated with medicines called antifungals. Contact a doctor if you do  not get better within 7 days of starting treatment. This information is not intended to replace advice given to you by your health care provider. Make sure you discuss any questions you have with your health care provider. Document Revised: 10/23/2022 Document Reviewed: 10/23/2022 Elsevier Patient Education  2024 Elsevier Inc.   If you have been instructed to have an in-person evaluation today at a local Urgent Care facility, please use the link below. It will take you to a list of all of our available [facility], including address, phone number and hours of operation. Please do not delay care.  [facility]  If you or a family member do not have a primary care provider, use the link below to schedule a visit and establish care. When you choose a [facility] primary care physician or advanced practice provider, you gain  a long-term partner in health. Find a Primary Care Provider  Learn more about [facility]'s in-office and virtual care options: [facility] Get Care Now

## 2024-10-08 ENCOUNTER — Other Ambulatory Visit: Payer: Self-pay | Admitting: Family Medicine

## 2024-10-08 DIAGNOSIS — B001 Herpesviral vesicular dermatitis: Secondary | ICD-10-CM

## 2024-10-09 ENCOUNTER — Encounter: Payer: Self-pay | Admitting: Family Medicine

## 2024-10-09 DIAGNOSIS — B370 Candidal stomatitis: Secondary | ICD-10-CM

## 2024-10-09 MED ORDER — NYSTATIN 100000 UNIT/ML MT SUSP: 5.0000 mL | Freq: Four times a day (QID) | OROMUCOSAL | 0 refills | Status: DC

## 2024-10-13 MED ORDER — NYSTATIN 100000 UNIT/ML MT SUSP: 5.0000 mL | Freq: Four times a day (QID) | OROMUCOSAL | 0 refills | Status: AC

## 2024-10-13 MED ORDER — NYSTATIN 100000 UNIT/ML MT SUSP: 5.0000 mL | Freq: Four times a day (QID) | OROMUCOSAL | 0 refills | Status: DC

## 2024-10-13 NOTE — Addendum Note (Signed)
 Addended by: VIVIENNE DELON HERO on: 10/13/2024 10:26 AM   Modules accepted: Orders

## 2024-10-13 NOTE — Addendum Note (Signed)
 Addended by: VIVIENNE DELON HERO on: 10/13/2024 10:45 AM   Modules accepted: Orders

## 2024-10-14 ENCOUNTER — Telehealth: Admitting: Physician Assistant

## 2024-10-14 DIAGNOSIS — T23221A Burn of second degree of single right finger (nail) except thumb, initial encounter: Secondary | ICD-10-CM
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
## 2024-10-24 ENCOUNTER — Encounter: Payer: Self-pay | Admitting: Obstetrics & Gynecology

## 2024-11-06 ENCOUNTER — Encounter: Payer: Managed Care, Other (non HMO) | Admitting: Family Medicine

## 2024-12-11 ENCOUNTER — Other Ambulatory Visit: Payer: Self-pay | Admitting: Family Medicine

## 2024-12-11 DIAGNOSIS — J0100 Acute maxillary sinusitis, unspecified: Secondary | ICD-10-CM

## 2024-12-15 ENCOUNTER — Encounter: Admitting: Family Medicine
